# Patient Record
Sex: Male | Born: 1935 | Race: White | Hispanic: No | Marital: Married | Smoking: Former smoker
Health system: Southern US, Community
[De-identification: ages and names within clinical notes are randomized; demographics above are authoritative.]

## PROBLEM LIST (undated history)

## (undated) DIAGNOSIS — M1990 Unspecified osteoarthritis, unspecified site: Secondary | ICD-10-CM

## (undated) DIAGNOSIS — R251 Tremor, unspecified: Secondary | ICD-10-CM

## (undated) DIAGNOSIS — E785 Hyperlipidemia, unspecified: Secondary | ICD-10-CM

## (undated) DIAGNOSIS — I219 Acute myocardial infarction, unspecified: Secondary | ICD-10-CM

## (undated) DIAGNOSIS — I2510 Atherosclerotic heart disease of native coronary artery without angina pectoris: Secondary | ICD-10-CM

## (undated) DIAGNOSIS — R413 Other amnesia: Principal | ICD-10-CM

## (undated) HISTORY — DX: Hyperlipidemia, unspecified: E78.5

## (undated) HISTORY — PX: PARATHYROIDECTOMY: SHX19

## (undated) HISTORY — PX: CATARACT EXTRACTION: SUR2

## (undated) HISTORY — DX: Atherosclerotic heart disease of native coronary artery without angina pectoris: I25.10

## (undated) HISTORY — PX: BACK SURGERY: SHX140

## (undated) HISTORY — PX: CARPAL TUNNEL RELEASE: SHX101

## (undated) HISTORY — DX: Other amnesia: R41.3

## (undated) HISTORY — DX: Tremor, unspecified: R25.1

## (undated) HISTORY — PX: INGUINAL HERNIA REPAIR: SUR1180

---

## 1998-01-15 ENCOUNTER — Encounter
Admission: RE | Admit: 1998-01-15 | Discharge: 1998-04-15 | Payer: Self-pay | Admitting: Physical Medicine & Rehabilitation

## 1998-02-21 ENCOUNTER — Ambulatory Visit (HOSPITAL_COMMUNITY): Admission: RE | Admit: 1998-02-21 | Discharge: 1998-02-21 | Payer: Self-pay | Admitting: Neurology

## 1998-10-31 ENCOUNTER — Encounter: Payer: Self-pay | Admitting: Internal Medicine

## 1998-10-31 ENCOUNTER — Ambulatory Visit (HOSPITAL_COMMUNITY): Admission: RE | Admit: 1998-10-31 | Discharge: 1998-10-31 | Payer: Self-pay | Admitting: Internal Medicine

## 1999-06-17 ENCOUNTER — Inpatient Hospital Stay (HOSPITAL_COMMUNITY): Admission: EM | Admit: 1999-06-17 | Discharge: 1999-06-20 | Payer: Self-pay | Admitting: Emergency Medicine

## 1999-06-17 ENCOUNTER — Encounter: Payer: Self-pay | Admitting: Emergency Medicine

## 2000-10-21 ENCOUNTER — Emergency Department (HOSPITAL_COMMUNITY): Admission: EM | Admit: 2000-10-21 | Discharge: 2000-10-21 | Payer: Self-pay | Admitting: Emergency Medicine

## 2000-10-28 ENCOUNTER — Emergency Department (HOSPITAL_COMMUNITY): Admission: EM | Admit: 2000-10-28 | Discharge: 2000-10-28 | Payer: Self-pay | Admitting: Emergency Medicine

## 2002-10-21 ENCOUNTER — Emergency Department (HOSPITAL_COMMUNITY): Admission: EM | Admit: 2002-10-21 | Discharge: 2002-10-21 | Payer: Self-pay

## 2005-05-14 ENCOUNTER — Ambulatory Visit (HOSPITAL_COMMUNITY): Admission: RE | Admit: 2005-05-14 | Discharge: 2005-05-14 | Payer: Self-pay | Admitting: Internal Medicine

## 2005-07-17 ENCOUNTER — Ambulatory Visit (HOSPITAL_COMMUNITY): Admission: RE | Admit: 2005-07-17 | Discharge: 2005-07-18 | Payer: Self-pay | Admitting: Ophthalmology

## 2006-01-08 ENCOUNTER — Encounter: Admission: RE | Admit: 2006-01-08 | Discharge: 2006-01-08 | Payer: Self-pay | Admitting: Internal Medicine

## 2008-03-28 ENCOUNTER — Ambulatory Visit (HOSPITAL_COMMUNITY): Admission: RE | Admit: 2008-03-28 | Discharge: 2008-03-29 | Payer: Self-pay | Admitting: Ophthalmology

## 2009-09-20 ENCOUNTER — Encounter: Payer: Self-pay | Admitting: Cardiovascular Disease

## 2009-11-20 ENCOUNTER — Ambulatory Visit: Payer: Self-pay | Admitting: Internal Medicine

## 2010-03-28 ENCOUNTER — Ambulatory Visit: Payer: Self-pay | Admitting: Internal Medicine

## 2010-04-05 ENCOUNTER — Ambulatory Visit: Payer: Self-pay | Admitting: Internal Medicine

## 2010-04-08 ENCOUNTER — Ambulatory Visit (HOSPITAL_COMMUNITY): Admission: RE | Admit: 2010-04-08 | Discharge: 2010-04-08 | Payer: Self-pay | Admitting: Internal Medicine

## 2010-04-15 ENCOUNTER — Ambulatory Visit: Payer: Self-pay | Admitting: Internal Medicine

## 2010-06-20 ENCOUNTER — Ambulatory Visit (HOSPITAL_COMMUNITY): Admission: RE | Admit: 2010-06-20 | Discharge: 2010-06-20 | Payer: Self-pay | Admitting: Surgery

## 2010-09-26 ENCOUNTER — Ambulatory Visit
Admission: RE | Admit: 2010-09-26 | Discharge: 2010-09-26 | Payer: Self-pay | Source: Home / Self Care | Attending: Internal Medicine | Admitting: Internal Medicine

## 2010-09-26 ENCOUNTER — Encounter: Payer: Self-pay | Admitting: Cardiovascular Disease

## 2010-10-01 ENCOUNTER — Ambulatory Visit: Payer: Self-pay | Admitting: Cardiology

## 2010-11-01 ENCOUNTER — Encounter (HOSPITAL_BASED_OUTPATIENT_CLINIC_OR_DEPARTMENT_OTHER)
Admission: RE | Admit: 2010-11-01 | Discharge: 2010-11-01 | Disposition: A | Discharge status: Home / Self Care | Payer: MEDICARE | Source: Ambulatory Visit | Attending: Orthopedic Surgery | Admitting: Orthopedic Surgery

## 2010-11-01 DIAGNOSIS — Z01812 Encounter for preprocedural laboratory examination: Secondary | ICD-10-CM | POA: Insufficient documentation

## 2010-11-01 LAB — BASIC METABOLIC PANEL
BUN: 10 mg/dL (ref 6–23)
CO2: 27 mEq/L (ref 19–32)
Chloride: 104 mEq/L (ref 96–112)
Creatinine, Ser: 0.86 mg/dL (ref 0.4–1.5)
GFR calc Af Amer: >60 mL/min (ref >60)
Glucose, Bld: 128 mg/dL — ABNORMAL HIGH (ref 70–99)

## 2010-11-05 ENCOUNTER — Ambulatory Visit (HOSPITAL_BASED_OUTPATIENT_CLINIC_OR_DEPARTMENT_OTHER)
Admission: RE | Admit: 2010-11-05 | Discharge: 2010-11-05 | Disposition: A | Discharge status: Home / Self Care | Payer: MEDICARE | Source: Ambulatory Visit | Attending: Orthopedic Surgery | Admitting: Orthopedic Surgery

## 2010-11-05 DIAGNOSIS — E1142 Type 2 diabetes mellitus with diabetic polyneuropathy: Secondary | ICD-10-CM | POA: Insufficient documentation

## 2010-11-05 DIAGNOSIS — Z01812 Encounter for preprocedural laboratory examination: Secondary | ICD-10-CM | POA: Insufficient documentation

## 2010-11-05 DIAGNOSIS — E1149 Type 2 diabetes mellitus with other diabetic neurological complication: Secondary | ICD-10-CM | POA: Insufficient documentation

## 2010-11-05 DIAGNOSIS — G5600 Carpal tunnel syndrome, unspecified upper limb: Secondary | ICD-10-CM | POA: Insufficient documentation

## 2010-11-05 DIAGNOSIS — G5620 Lesion of ulnar nerve, unspecified upper limb: Secondary | ICD-10-CM | POA: Insufficient documentation

## 2010-11-05 LAB — GLUCOSE, CAPILLARY: Glucose-Capillary: 94 mg/dL (ref 70–99)

## 2010-11-12 NOTE — Op Note (Signed)
NAME:  Joseph Mora, Joseph Mora               ACCOUNT NO.:  [id number]  MEDICAL RECORD NO.:  [id number]           PATIENT TYPE:  LOCATION:                                 FACILITY:  PHYSICIAN:  Katy Fitch. Toma Erichsen, M.D. DATE OF BIRTH:  [birth year]  DATE OF PROCEDURE:  11/05/2010 DATE OF DISCHARGE:                              OPERATIVE REPORT   PREOPERATIVE DIAGNOSES:  Severe right carpal tunnel syndrome with background type 2 diabetes and neuropathy, also significant right ulnar neuropathy at cubital tunnel with marked slowing of conduction velocities on preoperative electrodiagnostic studies.  POSTOPERATIVE DIAGNOSES:  Severe right carpal tunnel syndrome with background type 2 diabetes and neuropathy, also significant right ulnar neuropathy at cubital tunnel with marked slowing of conduction velocities on preoperative electrodiagnostic studies.  OPERATION: 1. Release of right transcarpal ligament. 2. In situ decompression of right ulnar nerve at cubital tunnel.  OPERATING SURGEON:  Katy Fitch. Akira Perusse, MD.  ASSISTANT:  Marveen Reeks. Dasnoit, PA-C.  ANESTHESIA:  General by LMA.  SUPERVISING ANESTHESIOLOGIST:  Janetta Hora. Gelene Mink, MD.  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 Luanna Cole. Mora for evaluation and management of hand pain and numbness.  Clinical examination and review of his past records revealed multiple complex medical problems including coronary artery disease, diabetes, hypertension, history of myocardial infarction with stent placement. His cardiologist is Dr. Roger Shelter.  He recently had parathyroidectomy by Dr. Gerrit Friends.  Clinical examination suggested mild neuropathy with superimposed very significant carpal tunnel syndrome and probable ulnar neuropathy based on irritability of the nerves and direct compression testing.  Dr. Johna Roles completed detailed electrodiagnostic studies, which  revealed very significant slowing in the median conduction across the carpal tunnels bilaterally and across the ulnar nerves at the elbows bilaterally.  After detailed informed consent during which we advised Joseph Mora that we could not guarantee full recovery of his sensibility, he is brought to the operating room at this time, anticipating a salvage procedure to improve his sensibility and motor function as much as possible and to try to decrease his chronic pain predicament.  After informed consent, he is brought to the operating room at this time.  PROCEDURE:  Joseph Mora was interviewed by Dr. Gelene Mink in the holding area; and after review of his complex medical records, general anesthesia by LMA technique was recommended and accepted.  He was brought to room #8 at [facility], placed in supine position on the operating table.  Following induction of general anesthesia by LMA technique under Dr. Thornton Dales direct supervision, the right arm was prepped with Betadine soap solution, sterilely draped.  Pneumatic tourniquet was applied to the proximal right brachium.  Following exsanguination of the right arm with Esmarch bandage, arterial tourniquet was inflated to 220 mmHg.  Procedure commenced with a short incision in line of the ring finger and the palm.  Subcutaneous tissues were carefully divided revealing the palmar fascia.  This was split longitudinally to reveal the common sensory branch of the median nerve. These were followed back to the transcarpal ligament which was gently isolated from the  median nerve with the aid of Catering manager. The transverse carpal ligament was very thick, measuring more than 5 mm in thickness.  The median nerve was very significantly compressed at the distal margin of transverse carpal ligament.  A meticulous release of the palmar fascia, transverse carpal ligament, and volar forearm fascia was accomplished with scissors into  the distal forearm.  This widely opened the carpal canal.  No mass or other predicaments were noted. Bleeding points along the margin of the released ligament were electrocauterized with bipolar current followed by repair of the skin with intradermal through a Prolene suture.  Attention was then directed to the elbow.  The posterior aspect of the medial epicondyle was identified.  A 3-cm incision was fashioned posterior to the epicondyle.  Subcutaneous tissue were carefully divided taking care to identify and gently protect the posterior branch of medial antebrachial cutaneous nerve.  The ulnar nerve was decompressed by release of the arcuate ligament, Osborne's band, the fascia at the head of flexor carpi ulnaris, and numerous vascular structures directly overlying the nerve in the distal cubital tunnel.  No mass or other predicaments were noted.  The brachial fascia was released proximally 6 cm above the cubital tunnel and the antebrachial fascia released 6 cm distally.  The nerve was noted be stable through range of motion 0 to 130 degrees. There was a triceps that covered the nerve, however, this appeared to be protective rather than causing nerve forward out of the groove.  The wound was inspected for bleeding points followed by release of the tourniquet.  Hemostasis was achieved with saline irrigation and bipolar cautery.  The wound was then repaired with layered closure with 4-0 Vicryl subcutaneous suture and intradermal 3-0 Prolene with Steri-Strips.  For aftercare, Joseph Mora is provided a prescription for Percocet 5 mg 1 p.o. q.4-6 h. p.r.n. pain 20 tablets without refill.  He is also provided doxycycline 100 mg 1 p.o. q.12 h. x4 days as prophylactic antibiotic.  He will resume all his routine medications prescribed by Dr. Deborah Chalk and Dr. Lenord Fellers.  It should be noted that sequential compression devices were applied to his calves and activated prior to induction of general  anesthesia.  We had a routine surgical time-out and noted his allergies to PENICILLIN, MORPHINE, and intolerance to ASPIRIN.     Katy Fitch Catalino Plascencia, M.D.     RVS/MEDQ  D:  11/05/2010  T:  11/06/2010  Job:  [id number]  cc:   Joseph Cole. Lenord Fellers, M.D. Colleen Can. Deborah Chalk, M.D.  Electronically Signed by Josephine Igo M.D. on 11/12/2010 08:14:08 AM

## 2010-12-05 ENCOUNTER — Encounter (HOSPITAL_BASED_OUTPATIENT_CLINIC_OR_DEPARTMENT_OTHER)
Admission: RE | Admit: 2010-12-05 | Discharge: 2010-12-05 | Disposition: A | Discharge status: Home / Self Care | Payer: Medicare Other | Source: Ambulatory Visit | Attending: Orthopedic Surgery | Admitting: Orthopedic Surgery

## 2010-12-05 DIAGNOSIS — Z01812 Encounter for preprocedural laboratory examination: Secondary | ICD-10-CM | POA: Insufficient documentation

## 2010-12-05 LAB — URINALYSIS, ROUTINE W REFLEX MICROSCOPIC
Bilirubin Urine: NEGATIVE
Glucose, UA: NEGATIVE mg/dL
Hgb urine dipstick: NEGATIVE
Ketones, ur: NEGATIVE mg/dL
Nitrite: NEGATIVE
Protein, ur: NEGATIVE mg/dL
Urobilinogen, UA: 0.2 mg/dL (ref 0.0–1.0)

## 2010-12-05 LAB — GLUCOSE, CAPILLARY
Glucose-Capillary: 164 mg/dL — ABNORMAL HIGH (ref 70–99)
Glucose-Capillary: 168 mg/dL — ABNORMAL HIGH (ref 70–99)

## 2010-12-05 LAB — SURGICAL PCR SCREEN: Staphylococcus aureus: NEGATIVE

## 2010-12-05 LAB — BASIC METABOLIC PANEL
BUN: 10 mg/dL (ref 6–23)
BUN: 14 mg/dL (ref 6–23)
CO2: 27 mEq/L (ref 19–32)
CO2: 27 mEq/L (ref 19–32)
Creatinine, Ser: 1.06 mg/dL (ref 0.4–1.5)
GFR calc Af Amer: >60 mL/min (ref >60)
GFR calc non Af Amer: >60 mL/min (ref >60)
Potassium: 4.4 mEq/L (ref 3.5–5.1)
Sodium: 139 mEq/L (ref 135–145)

## 2010-12-05 LAB — CBC
HCT: 47.4 % (ref 39.0–52.0)
MCHC: 34.6 g/dL (ref 30.0–36.0)
MCV: 96.9 fL (ref 78.0–100.0)
Platelets: 126 10*3/uL — ABNORMAL LOW (ref 150–400)
RBC: 4.89 MIL/uL (ref 4.22–5.81)
RDW: 13.6 % (ref 11.5–15.5)

## 2010-12-05 LAB — PROTIME-INR: INR: 1.05 (ref 0.00–1.49)

## 2010-12-05 LAB — DIFFERENTIAL
Basophils Absolute: 0 10*3/uL (ref 0.0–0.1)
Lymphocytes Relative: 21 % (ref 12–46)

## 2010-12-10 ENCOUNTER — Ambulatory Visit (HOSPITAL_BASED_OUTPATIENT_CLINIC_OR_DEPARTMENT_OTHER)
Admission: RE | Admit: 2010-12-10 | Discharge: 2010-12-10 | Disposition: A | Discharge status: Home / Self Care | Payer: Medicare Other | Source: Ambulatory Visit | Attending: Orthopedic Surgery | Admitting: Orthopedic Surgery

## 2010-12-10 DIAGNOSIS — F17200 Nicotine dependence, unspecified, uncomplicated: Secondary | ICD-10-CM | POA: Insufficient documentation

## 2010-12-10 DIAGNOSIS — I252 Old myocardial infarction: Secondary | ICD-10-CM | POA: Insufficient documentation

## 2010-12-10 DIAGNOSIS — Z01812 Encounter for preprocedural laboratory examination: Secondary | ICD-10-CM | POA: Insufficient documentation

## 2010-12-10 DIAGNOSIS — G5600 Carpal tunnel syndrome, unspecified upper limb: Secondary | ICD-10-CM | POA: Insufficient documentation

## 2010-12-10 DIAGNOSIS — E119 Type 2 diabetes mellitus without complications: Secondary | ICD-10-CM | POA: Insufficient documentation

## 2010-12-10 DIAGNOSIS — G5620 Lesion of ulnar nerve, unspecified upper limb: Secondary | ICD-10-CM | POA: Insufficient documentation

## 2010-12-10 LAB — GLUCOSE, CAPILLARY: Glucose-Capillary: 113 mg/dL — ABNORMAL HIGH (ref 70–99)

## 2010-12-10 LAB — POCT HEMOGLOBIN-HEMACUE: Hemoglobin: 16.5 g/dL (ref 13.0–17.0)

## 2010-12-17 NOTE — Op Note (Signed)
NAME:  Joseph Mora, Joseph Mora               ACCOUNT NO.:  [id number]  MEDICAL RECORD NO.:  [id number]          PATIENT TYPE:  AMB  LOCATION:  DAY                          FACILITY:  [facility]  PHYSICIAN:  Katy Fitch. Shawndrea Rutkowski, M.D. DATE OF BIRTH:  10-Dec-1932  DATE OF PROCEDURE:  12/10/2010 DATE OF DISCHARGE:  06/20/2010                              OPERATIVE REPORT   PREOPERATIVE DIAGNOSES: 1. Chronic left ulnar neuropathy at cubital tunnel, associated with     diabetes. 2. Chronic left carpal tunnel syndrome, associated with diabetes.  POSTOPERATIVE DIAGNOSES: 1. Chronic left ulnar neuropathy at cubital tunnel, associated with     diabetes. 2. Chronic left carpal tunnel syndrome, associated with diabetes.  OPERATION: 1. In situ decompression, left ulnar nerve at cubital tunnel with     resection of triceps aponeurosis. 2. Left carpal tunnel release.  OPERATING SURGEON:  Katy Fitch. Jerry Clyne, M.D.  ASSISTANT:  Jonni Sanger, P.A.  ANESTHESIA:  General by LMA.  SUPERVISING ANESTHESIOLOGIST:  Dr. Jean Rosenthal.  INDICATIONS:  Joseph Mora is a 77 year old gentleman referred through the courtesy of Dr. Eden Emms Baxley and Dr. Delfin Edis for evaluation and management of chronic entrapment neuropathy of the median and ulnar nerves bilaterally.  Joseph Mora was thoroughly worked up by Dr. Lenord Fellers and referred to our office for evaluation of hand numbness.  Clinical examination revealed signs of significant entrapment neuropathy associated with diabetes.  He is status post release of his right transverse carpal ligament and right ulnar nerve at the cubital tunnel in February with good recovery of sensibility.  He now returns for identical surgery on the left side.  Preoperatively, we reviewed his past medical history.  He has a BMI of 35.  He has diabetes.  His blood glucose was 169 preoperatively.  He is allergic to PENICILLIN, MORPHINE, and ASPIRIN.  Preoperatively, he was interviewed in the  holding area.  Questions were invited and answered in detail with Joseph Mora and his wife.  PROCEDURE:  Joseph Mora was brought to room #1 of the [facility] and placed in supine position on the operating table.  Following the induction of general anesthesia by LMA technique, the left arm was prepped with Betadine soap solution and sterilely draped.  Due to his PENICILLIN allergy, 1 g of vancomycin was administered as an IV prophylactic antibiotic.  The left arm was exsanguinated with Esmarch bandage, and arterial tourniquet was inflated to 250 mmHg due to episodic elevated blood pressure.  After routine surgical time-out, the procedure commenced with a short incision in line of the ring finger and the palm.  Subcutaneous tissues were carefully divided revealing the palmar fascia.  This was split longitudinally to reveal the common sensory branch of the median nerve.  These were followed back to the transverse carpal ligament which was gently isolated from the median nerve.  Ligament was then released along its ulnar border, extending into the distal forearm.  This widely opened the carpal canal.  Bleeding points along the margin of the released ligament were electrocauterized with bipolar current followed by repair of the skin with intradermal 2-0 Prolene suture.  Attention  was then directed to the elbow.  The ulnar nerve was palpated behind the posterior aspect of the medial epicondyle.  A 3-cm incision was fashioned directly over the nerve.  Subcutaneous tissues were carefully divided, taking care to identify and spare the posterior branch of the median, ulnar, and brachial cutaneous nerve.  The arcuate ligament was identified and split with scissors.  The nerve was decompressed 6 cm above the epicondyle and 6 cm distal to the epicondyle.  The fascia at the head of the flexor carpi ulnaris was split.  Vascular structures over the nerve were released.  A  large- caliber vein encircling the nerve was suture ligated with 4-0 Vicryl.  The nerve was noted to be stable with elbow range of motion 0 to 130 degrees.  The triceps fascia did tend to compress the nerve with flexion of the elbow beyond 90 degrees, therefore with the aid of bipolar cautery we resected a strip of the aponeurosis, measuring 1 cm in width and approximately 3 cm in length, widely decompressing the nerve at the cubital tunnel.  Thereafter, only triceps muscle was adjacent to the nerve and no subluxation noted.  Bleeding points were electrocauterized with bipolar current followed by repair of the skin and subcutaneous with 4-0 Vicryl suture and intradermal 2-0 Prolene with Steri-Strips; 2% lidocaine was infiltrated for postop analgesia.  Joseph Mora was placed in a compressive dressing at the elbow with sterile gauze and Tegaderm over the wound and at the wrist.  Steri-Strips, sterile gauze, sterile Webril, and a volar plaster splint maintaining the wrist in 10 degrees of dorsiflexion.  For aftercare, he is provided prescription for Vicodin 5 mg one p.o. q.4- 6 h. p.r.n. pain, 20 tablets without refill.  I will see him back for followup in the office in 8 days, for dressing change and initiation of range of motion exercise program.     Katy Fitch. Macsen Nuttall, M.D.     RVS/MEDQ  D:  12/10/2010  T:  12/11/2010  Job:  [id number]  cc:   Luanna Cole. Lenord Fellers, M.D. Colleen Can. Deborah Chalk, M.D.  Electronically Signed by Josephine Igo M.D. on 12/17/2010 01:11:51 PM

## 2011-02-04 NOTE — Op Note (Signed)
NAME:  Joseph Mora, MCMANAMON NO.:  [id number]   MEDICAL RECORD NO.:  [id number]          PATIENT TYPE:  OIB   LOCATION:  5128                         FACILITY:  [facility]   PHYSICIAN:  Beulah Gandy. Ashley Royalty, M.D. DATE OF BIRTH:  07-23-1933   DATE OF PROCEDURE:  03/28/2008  DATE OF DISCHARGE:                               OPERATIVE REPORT   ADMISSION DIAGNOSES:  1. Proliferative diabetic retinopathy.  2. Traction retinal detachment.  3. Posterior vitreous membranes vitreous hemorrhage, left eye.   PROCEDURES:  1. Pars plana vitrectomy with membrane peel.  2. Retinal photocoagulation.  3. Gas injection, left eye.   SURGEON:  Beulah Gandy. Ashley Royalty, MD   ASSISTANT:  Rosalie Doctor, MA   ANESTHESIA:  General.   DETAILS:  Usual prep and drape, a 25-gauge trocar was placed at 10, 2,  and 4 o'clock.  Infusion at 4 o'clock.  The lighted pick and the cutter  were placed at 10 and 2 o'clock respectively.  Provisc placed on the  corneal surface.  Pars plana vitrectomy was begun just behind the  pseudophakos.  Blood and vitreous was encountered and carefully removed  from the posterior aspect of the pseudophakos.  The vitrectomy is  carried posteriorly and vitreous membranes were seen at the disk and  along with superior arcade.  These were carefully removed under low  suction and rapid cutting and with the use of the vitreous lighted pick.  These membranes were trimmed down to the retinal surface.  The  vitrectomy was carried out into the far periphery where additional areas  of blood and vitreous were encountered.  These were trimmed for 360  degrees down to the vitreous base.  Scleral depression was used.  Once  this was accomplished, the endolaser was positioned in the eye, 495  burns placed around the retinal periphery.  The power 1000 milliwatts,  1000 microns each and 0.1 seconds each.  A 30% gas fluid exchange was  carried out with sterile room air.  The instruments were removed  from  the eye and the wounds were held until tight.  Polymyxin and gentamicin  were irrigated into Tenon's space.  Marcaine was injected around the  globe for postop pain.  Decadron 10 mg was injected at the lower  subconjunctival space.  Closing pressure was 10 with a Barraquer  tonometer.  TobraDex ophthalmic ointment, patch, and shield were placed.  The patient was awakened and taken to recovery in satisfactory  condition.  Complications none.  Duration 30 minutes.      Beulah Gandy. Ashley Royalty, M.D.  Electronically Signed    JDM/MEDQ  D:  03/28/2008  T:  03/29/2008  Job:  [id number]

## 2011-02-07 NOTE — Op Note (Signed)
NAME:  Joseph, Mora NO.:  [id number]   MEDICAL RECORD NO.:  [id number]          PATIENT TYPE:  OIB   LOCATION:  5705                         FACILITY:  [facility]   PHYSICIAN:  Beulah Gandy. Ashley Royalty, M.D. DATE OF BIRTH:  [birth year]   DATE OF PROCEDURE:  DATE OF DISCHARGE:                                 OPERATIVE REPORT   ADMISSION DIAGNOSIS:  Vitreous hemorrhage, retained lens material,  proliferative diabetic retinopathy, right eye.   PROCEDURE:  1.  Pars plana vitrectomy.  2.  Retinal photocoagulation.  3.  Gas fluid exchange, right eye.   SURGEON:  Beulah Gandy. Ashley Royalty, M.D.   ASSISTANT:  Adela Ports , RN   ANESTHESIA:  General.   DETAILS:  Usual prep and drape, trocar placement at 8, 10, and 2 o'clock.  Infusion port at 8 o'clock.  The lighted pick and the cutter were placed at  10 and 2 o'clock respectively. Provisc placed on the corneal surface. The  BIOM viewing system moved into place. Pars plana vitrectomy was begun just  behind the pseudophakos. White lens material was removed from the edge of  the pseudophakos. The vitrectomy was carried posteriorly where vitreous  blood was seen. This was carefully removed under low suction and rapid  cutting for 360 degrees. Scleral depression was used at 6 o'clock to gain  access to the peripheral vitreous space; and then removed red blood from  this area. Lens remnants and vitreous blood were removed.   Once all of the vitreous was removed.  The endolaser was positioned in the  eye, 813 burns were placed around the retinal periphery at the power 1000  milliwatts, 1000 microns each, and 0.1 seconds each. A washout procedure was  performed; 30% gas fluid exchange was performed.  The instruments and  trocars were removed from the eye. Polymyxin and gentamicin were irrigated  into Tenons space. Atropine solution was applied  Decadron 10 mg was  injected to the lower subconjunctival space. Marcaine was injected around  the  globe for postop pain. Closing pressure was 10 with a Risk manager.  Complications none. Duration 45 minutes. TobraDex ophthalmic ointment, and a  patch and shield were placed. The patient was awakened and taken to recovery  in satisfactory condition.      Beulah Gandy. Ashley Royalty, M.D.  Electronically Signed     JDM/MEDQ  D:  07/17/2005  T:  07/17/2005  Job:  [id number]

## 2011-02-15 ENCOUNTER — Other Ambulatory Visit: Payer: Self-pay | Admitting: Internal Medicine

## 2011-02-28 ENCOUNTER — Other Ambulatory Visit: Payer: Self-pay | Admitting: Internal Medicine

## 2011-03-28 ENCOUNTER — Other Ambulatory Visit: Payer: Self-pay | Admitting: Internal Medicine

## 2011-04-03 ENCOUNTER — Telehealth: Payer: Self-pay | Admitting: Cardiology

## 2011-04-03 ENCOUNTER — Ambulatory Visit (INDEPENDENT_AMBULATORY_CARE_PROVIDER_SITE_OTHER): Payer: Medicare Other | Admitting: Cardiovascular Disease

## 2011-04-03 ENCOUNTER — Encounter: Payer: Self-pay | Admitting: Cardiovascular Disease

## 2011-04-03 VITALS — BP 111/73 | HR 70 | Ht 64.0 in | Wt 205.0 lb

## 2011-04-03 DIAGNOSIS — I2510 Atherosclerotic heart disease of native coronary artery without angina pectoris: Secondary | ICD-10-CM | POA: Insufficient documentation

## 2011-04-03 DIAGNOSIS — E785 Hyperlipidemia, unspecified: Secondary | ICD-10-CM | POA: Insufficient documentation

## 2011-04-03 NOTE — Telephone Encounter (Deleted)
[phone] LABS

## 2011-04-03 NOTE — Assessment & Plan Note (Addendum)
Stable. Continue current meds.   

## 2011-04-03 NOTE — Assessment & Plan Note (Signed)
Continue statin. Will get recent Lipids from [place] office.

## 2011-04-03 NOTE — Patient Instructions (Signed)
Your physician wants you to follow-up in: 6 months with Dr. McAlhany.  You will receive a reminder letter in the mail two months in advance. If you don't receive a letter, please call our office to schedule the follow-up appointment.  

## 2011-04-03 NOTE — Progress Notes (Signed)
History of Present Illness:77 yo WM with history of CAD, DM, HL, DJD here today for cardiac follow up. He has been followed in the past by Dr. Deborah Chalk. His cardiac history dates back to 41 when he had a stent placed in the Circumflex. He had a stent placed in the RCA in 2000. LVEF has been normal. Most recent stress test in July 2011 with LVEF of 68% and no ischemia. He tells me that he has chronic hip pain which limits his walking. No chest pain. No trouble breathing. He works in the yard and builds swings. Overall doing well.   Past Medical History  Diagnosis Date  . CAD (coronary artery disease)   . Hyperlipidemia   . Diabetes mellitus     Past Surgical History  Procedure Date  . Carpal tunnel release   . Parathyroidectomy   . Eye surgery   . Inguinal hernia repair     Current Outpatient Prescriptions  Medication Sig Dispense Refill  . aspirin 325 MG tablet Take 325 mg by mouth daily.        Marland Kitchen atorvastatin (LIPITOR) 40 MG tablet        . doxazosin (CARDURA) 2 MG tablet TAKE 1 TABLET BY MOUTH DAILY  30 tablet  5  . HUMULIN N 100 UNIT/ML injection INJECT 60 UNITS BEFORE BREAKFAST AND 30 UNITS BEFORE SUPPER  90 mL  PRN  . lisinopril (PRINIVIL,ZESTRIL) 20 MG tablet TAKE 1 TABLET BY MOUTH DAILY  30 tablet  5  . metoprolol (LOPRESSOR) 50 MG tablet TAKE 1 TABLET BY MOUTH TWICE DAILY  60 tablet  5    Allergies  Allergen Reactions  . Aspirin     Hives H/O  . Morphine And Related   . Penicillins     History   Social History  . Marital Status: Married    Spouse Name: N/A    Number of Children: N/A  . Years of Education: N/A   Occupational History  . Not on file.   Social History Main Topics  . Smoking status: Former Smoker    Quit date: 08/03/2010  . Smokeless tobacco: Current User    Types: Chew  . Alcohol Use: No  . Drug Use: No  . Sexually Active: Not on file   Other Topics Concern  . Not on file   Social History Narrative  . No narrative on file    Family  History  Problem Relation Age of Onset  . Heart attack Paternal Aunt     Review of Systems:  As stated in the HPI and otherwise negative.   BP 111/73  Pulse 70  Ht 5\' 4"  (1.626 m)  Wt 205 lb (92.987 kg)  BMI 35.19 kg/m2  Physical Examination: General: Well developed, well nourished, NAD HEENT: OP clear, mucus membranes moist SKIN: warm, dry. No rashes. Neuro: No focal deficits Musculoskeletal: Muscle strength 5/5 all ext Psychiatric: Mood and affect normal Neck: No JVD, no carotid bruits, no thyromegaly, no lymphadenopathy. Lungs:Clear bilaterally, no wheezes, rhonci, crackles Cardiovascular: Regular rate and rhythm. No murmurs, gallops or rubs. Abdomen:Soft. Bowel sounds present. Non-tender.  Extremities: No lower extremity edema. Pulses are trace to 1+ in the bilateral DP/PT.  EKG:NSR, 70 bpm.

## 2011-04-16 ENCOUNTER — Encounter: Payer: Self-pay | Admitting: Internal Medicine

## 2011-04-21 ENCOUNTER — Ambulatory Visit (INDEPENDENT_AMBULATORY_CARE_PROVIDER_SITE_OTHER): Payer: Medicare Other | Admitting: Internal Medicine

## 2011-04-21 ENCOUNTER — Encounter: Payer: Self-pay | Admitting: Internal Medicine

## 2011-04-21 DIAGNOSIS — E119 Type 2 diabetes mellitus without complications: Secondary | ICD-10-CM

## 2011-04-21 DIAGNOSIS — G8929 Other chronic pain: Secondary | ICD-10-CM | POA: Insufficient documentation

## 2011-04-21 DIAGNOSIS — E785 Hyperlipidemia, unspecified: Secondary | ICD-10-CM

## 2011-04-21 DIAGNOSIS — Z8619 Personal history of other infectious and parasitic diseases: Secondary | ICD-10-CM | POA: Insufficient documentation

## 2011-04-21 DIAGNOSIS — E213 Hyperparathyroidism, unspecified: Secondary | ICD-10-CM | POA: Insufficient documentation

## 2011-04-21 DIAGNOSIS — M549 Dorsalgia, unspecified: Secondary | ICD-10-CM

## 2011-04-21 DIAGNOSIS — Z86010 Personal history of colonic polyps: Secondary | ICD-10-CM

## 2011-04-21 DIAGNOSIS — N400 Enlarged prostate without lower urinary tract symptoms: Secondary | ICD-10-CM | POA: Insufficient documentation

## 2011-04-21 DIAGNOSIS — IMO0001 Reserved for inherently not codable concepts without codable children: Secondary | ICD-10-CM | POA: Insufficient documentation

## 2011-04-21 DIAGNOSIS — I10 Essential (primary) hypertension: Secondary | ICD-10-CM

## 2011-04-21 LAB — BASIC METABOLIC PANEL
CO2: 26 mEq/L (ref 19–32)
Calcium: 9.1 mg/dL (ref 8.4–10.5)
Chloride: 104 mEq/L (ref 96–112)
Creat: 1.01 mg/dL (ref 0.50–1.35)
Glucose, Bld: 117 mg/dL — ABNORMAL HIGH (ref 70–99)

## 2011-04-21 LAB — HEPATIC FUNCTION PANEL
Albumin: 3.9 g/dL (ref 3.5–5.2)
Alkaline Phosphatase: 65 U/L (ref 39–117)
Bilirubin, Direct: 0.1 mg/dL (ref 0.0–0.3)
Total Bilirubin: 0.6 mg/dL (ref 0.3–1.2)

## 2011-04-21 LAB — CBC WITH DIFFERENTIAL/PLATELET
Eosinophils Absolute: 0.2 10*3/uL (ref 0.0–0.7)
Eosinophils Relative: 3 % (ref 0–5)
HCT: 49.1 % (ref 39.0–52.0)
Hemoglobin: 15.9 g/dL (ref 13.0–17.0)
Lymphocytes Relative: 23 % (ref 12–46)
Lymphs Abs: 1.6 10*3/uL (ref 0.7–4.0)
MCH: 31.5 pg (ref 26.0–34.0)
MCV: 97.2 fL (ref 78.0–100.0)
Monocytes Absolute: 0.5 10*3/uL (ref 0.1–1.0)
Monocytes Relative: 7 % (ref 3–12)
Platelets: 117 10*3/uL — ABNORMAL LOW (ref 150–400)
RBC: 5.05 MIL/uL (ref 4.22–5.81)
WBC: 6.8 10*3/uL (ref 4.0–10.5)

## 2011-04-21 LAB — POCT URINALYSIS DIPSTICK
Leukocytes, UA: NEGATIVE
Nitrite, UA: NEGATIVE
Protein, UA: NEGATIVE
pH, UA: 6

## 2011-04-21 LAB — PSA: PSA: 1.35 ng/mL (ref <=4.00)

## 2011-04-21 LAB — LIPID PANEL
HDL: 35 mg/dL — ABNORMAL LOW (ref >39)
LDL Cholesterol: 89 mg/dL (ref 0–99)
Total CHOL/HDL Ratio: 4.1 Ratio

## 2011-04-21 NOTE — Progress Notes (Signed)
Subjective:    Patient ID: Michel Santee, male    DOB: 12/16/1932, 77 y.o.   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 Coronary artery disease status post MI with PTCA for 100% occluded circumflex artery September 1990. CMV infection 1997. Diagnosed with Helicobacter  pylori gastritis March 1999. History of right inguinal hernia repair 1953. Lumbar disc surgery L4-L5 and L5-S1 1998. Cataract removal right eye 2006. Also had cardiac catheterization and stent placed to right coronary artery September 2000. Hand surgery for chronic left ulnar neuropathy due to diabetes and a left carpal tunnel release done by Dr. Teressa Senter for March 2012. Cardiologist did nuclear perfusion study July 2011 which was normal. Had severe right carpal tunnel syndrome release and insight to decompression the right ulnar nerve by Dr. Teressa Senter February 2012. Surgery for left superior parathyroid adenoma September 2011. Had colonoscopy done by Dr. Loreta Ave with diagnosis of adenomatous polyp October 2010. Had Pneumovax vaccine in 1997. He thought he had a reaction to the vaccine but actually had an acute bout of CMV. However it has not been repeated since then. In 1999 he was hospitalized for back surgery and subsequently developed a GI bleed requiring emergency surgery with gastroduodenostomy, pyloroplasty, and truncal vagotomy. A large bleeding duodenal ulcer was detected. H. pylori clo test was positive and was treated at that time. He quit smoking in 1990. Chews tobacco Does not consume alcohol. He is married, has adult children and has raised a number of foster children over the years.  Family history father died age 55 struck by a motor vehicle; mother died at age 12 with history of diabetes mellitus, stroke and  Alzheimer's disease. One brother died at age 73 with history of colon cancer alcoholism and MI. 7 sisters and 7 brothers living. One living brother with adult onset diabetes. 3 sisters with diabetes mellitus.    Review of Systems  Constitutional:       Says he's able to walk 1 mile daily. Use to walk more than that but back hurts a lot  HENT: Negative.   Eyes: Negative.   Respiratory: Negative.   Cardiovascular: Negative.   Gastrointestinal: Negative.   Genitourinary:       Some hesitancy with urine stream  Musculoskeletal: Positive for back pain.  Neurological: Negative.   Hematological: Negative.   Psychiatric/Behavioral: Negative.        Objective:   Physical Exam  Vitals reviewed. Constitutional: He is oriented to person, place, and time. He appears well-developed and well-nourished. No distress.  HENT:  Head: Normocephalic and atraumatic.       Cerumen both ears  Eyes: Conjunctivae and EOM are normal. Pupils are equal, round, and reactive to light. Right eye exhibits no discharge. Left eye exhibits no discharge. No scleral icterus.  Neck: Neck supple. No JVD present. No thyromegaly present.  Cardiovascular: Normal rate, regular rhythm, normal heart sounds and intact distal pulses.   No murmur heard. Pulmonary/Chest: Effort normal and breath sounds normal. No respiratory distress. He has no wheezes. He has no rales. He exhibits no tenderness.  Abdominal: Soft. Bowel sounds are normal. He exhibits no distension and no mass. There is no tenderness. There is no rebound and no guarding.       Ventral hernia  Genitourinary:       Enlarged  prostate no masses  Musculoskeletal: Normal range of motion. He exhibits no edema.  Lymphadenopathy:    He has no cervical adenopathy.  Neurological: He is alert and oriented to person, place, and time. He has normal reflexes. No cranial nerve deficit. Coordination normal.  Skin: No rash noted. He is not diaphoretic.       Onychomycosis both  feet  Psychiatric: He has a normal mood and affect. His behavior is normal. Judgment and thought content normal.          Assessment & Plan:   Adult onset diabetes mellitus-insulin-dependent  Coronary artery disease status post PTCA circumflex artery 1990 and stent to right coronary artery September 2000.  Hyperlipidemia  History of adenomatous colon polyps  Benign prostatic hypertrophy  History of parathyroid adenoma removed 2011  History of spinal stenosis and lumbar disc disease with chronic back pain  History of acute bleeding duodenal ulcer after back surgery 1999 with positive test for H. Pylori  History of bilateral carpal tunnel syndrome status post release and ulnar decompression bilaterally  Patient will return in 6 months for office visit, hemoglobin A1c, lipid panel liver functions. He is to receive influenza immunization this fall.

## 2011-04-21 NOTE — Telephone Encounter (Signed)
No note required for this encounter.

## 2011-04-22 ENCOUNTER — Encounter: Payer: Self-pay | Admitting: Internal Medicine

## 2011-04-22 LAB — HEMOGLOBIN A1C: Hgb A1c MFr Bld: 9.5 % — ABNORMAL HIGH (ref <5.7)

## 2011-04-28 ENCOUNTER — Ambulatory Visit (INDEPENDENT_AMBULATORY_CARE_PROVIDER_SITE_OTHER): Payer: Medicare Other | Admitting: Ophthalmology

## 2011-04-28 DIAGNOSIS — E11359 Type 2 diabetes mellitus with proliferative diabetic retinopathy without macular edema: Secondary | ICD-10-CM

## 2011-04-28 DIAGNOSIS — H43819 Vitreous degeneration, unspecified eye: Secondary | ICD-10-CM

## 2011-04-29 ENCOUNTER — Telehealth: Payer: Self-pay | Admitting: Internal Medicine

## 2011-05-01 NOTE — Telephone Encounter (Signed)
Dr. Lenord Fellers made aware of results of blood sugars

## 2011-05-15 ENCOUNTER — Other Ambulatory Visit: Payer: Self-pay | Admitting: Internal Medicine

## 2011-05-30 ENCOUNTER — Emergency Department (HOSPITAL_COMMUNITY): Admission: EM | Admit: 2011-05-30 | Payer: Medicare Other | Source: Home / Self Care

## 2011-06-19 LAB — COMPREHENSIVE METABOLIC PANEL
Albumin: 3.4 — ABNORMAL LOW
Alkaline Phosphatase: 106
BUN: 10
CO2: 28
Chloride: 102
Creatinine, Ser: 0.93
GFR calc non Af Amer: >60
Potassium: 4.3
Total Bilirubin: 0.9

## 2011-06-19 LAB — CBC
HCT: 49.2
Hemoglobin: 16.7
MCV: 95.4
Platelets: 132 — ABNORMAL LOW
RBC: 5.16
WBC: 6.9

## 2011-06-26 ENCOUNTER — Other Ambulatory Visit: Payer: Self-pay | Admitting: Internal Medicine

## 2011-07-07 ENCOUNTER — Encounter: Payer: Self-pay | Admitting: Internal Medicine

## 2011-09-25 ENCOUNTER — Other Ambulatory Visit: Payer: Self-pay | Admitting: Internal Medicine

## 2011-10-01 ENCOUNTER — Encounter: Payer: Self-pay | Admitting: Cardiovascular Disease

## 2011-10-01 ENCOUNTER — Ambulatory Visit (INDEPENDENT_AMBULATORY_CARE_PROVIDER_SITE_OTHER): Payer: Medicare Other | Admitting: Cardiovascular Disease

## 2011-10-01 VITALS — BP 125/76 | HR 74 | Ht 64.5 in | Wt 204.0 lb

## 2011-10-01 DIAGNOSIS — E785 Hyperlipidemia, unspecified: Secondary | ICD-10-CM

## 2011-10-01 DIAGNOSIS — I2510 Atherosclerotic heart disease of native coronary artery without angina pectoris: Secondary | ICD-10-CM

## 2011-10-01 MED ORDER — ASPIRIN 81 MG PO TABS: 81.0000 mg | ORAL_TABLET | Freq: Every day | ORAL | Status: DC

## 2011-10-01 NOTE — Assessment & Plan Note (Signed)
Stable No changes 

## 2011-10-01 NOTE — Patient Instructions (Addendum)
Your physician wants you to follow-up in: 6 months.  You will receive a reminder letter in the mail two months in advance. If you don't receive a letter, please call our office to schedule the follow-up appointment.  Your physician recommends that you return for fasting lab work in: 6 months on day of appt with Dr. Clifton James   Your physician has recommended you make the following change in your medication: Decrease aspirin to 81 mg by mouth daily

## 2011-10-01 NOTE — Assessment & Plan Note (Signed)
Cholesterol is controlled. Will repeat LFTs, lipids in 6 months.

## 2011-10-01 NOTE — Progress Notes (Signed)
History of Present Illness: 77 yo WM with history of CAD, DM, HL, DJD here today for cardiac follow up. He has been followed in the past by Dr. Deborah Chalk. His cardiac history dates back to 19 when he had a stent placed in the Circumflex. He had a stent placed in the RCA in 2000. LVEF has been normal. Most recent stress test in July 2011 with LVEF of 68% and no ischemia. He tells me that he has chronic hip pain which limits his walking. No chest pain. No trouble breathing. He works in the yard and builds swings. Overall doing well.   Liipids in July 2012 with LDL of 89, total chol 145.    Past Medical History  Diagnosis Date  . CAD (coronary artery disease)   . Hyperlipidemia   . Diabetes mellitus     Past Surgical History  Procedure Date  . Carpal tunnel release   . Parathyroidectomy   . Eye surgery   . Inguinal hernia repair     Current Outpatient Prescriptions  Medication Sig Dispense Refill  . aspirin 325 MG tablet Take 325 mg by mouth daily.        Marland Kitchen atorvastatin (LIPITOR) 40 MG tablet TAKE 1 TABLET BY MOUTH EVERY NIGHT AT BEDTIME  30 tablet  0  . doxazosin (CARDURA) 2 MG tablet TAKE 1 TABLET BY MOUTH DAILY  30 tablet  5  . ibuprofen (ADVIL,MOTRIN) 200 MG tablet Take 200 mg by mouth every 6 (six) hours as needed.      . insulin regular (HUMULIN R) 100 units/mL injection Inject 100 Units into the skin 2 (two) times daily before a meal. 62 units in the am 32 units at bedtime      . lisinopril (PRINIVIL,ZESTRIL) 20 MG tablet TAKE 1 TABLET BY MOUTH DAILY  30 tablet  5  . metoprolol (LOPRESSOR) 50 MG tablet TAKE 1 TABLET BY MOUTH TWICE DAILY  60 tablet  5    Allergies  Allergen Reactions  . Aspirin     Hives H/O  . Morphine And Related Itching  . Penicillins Hives    History   Social History  . Marital Status: Married    Spouse Name: N/A    Number of Children: N/A  . Years of Education: N/A   Occupational History  . Not on file.   Social History Main Topics  .  Smoking status: Former Smoker    Quit date: 08/03/2010  . Smokeless tobacco: Current User    Types: Chew  . Alcohol Use: No  . Drug Use: No  . Sexually Active: Not on file   Other Topics Concern  . Not on file   Social History Narrative  . No narrative on file    Family History  Problem Relation Age of Onset  . Heart attack Paternal Aunt     Review of Systems:  As stated in the HPI and otherwise negative.   BP 125/76  Pulse 74  Ht 5' 4.5" (1.638 m)  Wt 204 lb (92.534 kg)  BMI 34.48 kg/m2  Physical Examination: General: Well developed, well nourished, NAD HEENT: OP clear, mucus membranes moist SKIN: warm, dry. No rashes. Neuro: No focal deficits Musculoskeletal: Muscle strength 5/5 all ext Psychiatric: Mood and affect normal Neck: No JVD, no carotid bruits, no thyromegaly, no lymphadenopathy. Lungs:Clear bilaterally, no wheezes, rhonci, crackles Cardiovascular: Regular rate and rhythm. No murmurs, gallops or rubs. Abdomen:Soft. Bowel sounds present. Non-tender.  Extremities: No lower extremity edema. Pulses are  2 + in the bilateral DP/PT.

## 2011-10-17 ENCOUNTER — Other Ambulatory Visit: Payer: Self-pay | Admitting: Internal Medicine

## 2011-10-17 ENCOUNTER — Encounter: Payer: Self-pay | Admitting: Internal Medicine

## 2011-10-17 ENCOUNTER — Ambulatory Visit (INDEPENDENT_AMBULATORY_CARE_PROVIDER_SITE_OTHER): Payer: Medicare Other | Admitting: Internal Medicine

## 2011-10-17 DIAGNOSIS — Z86010 Personal history of colonic polyps: Secondary | ICD-10-CM

## 2011-10-17 DIAGNOSIS — E785 Hyperlipidemia, unspecified: Secondary | ICD-10-CM

## 2011-10-17 DIAGNOSIS — I2510 Atherosclerotic heart disease of native coronary artery without angina pectoris: Secondary | ICD-10-CM

## 2011-10-17 DIAGNOSIS — E119 Type 2 diabetes mellitus without complications: Secondary | ICD-10-CM

## 2011-10-17 DIAGNOSIS — M4800 Spinal stenosis, site unspecified: Secondary | ICD-10-CM

## 2011-10-17 DIAGNOSIS — N400 Enlarged prostate without lower urinary tract symptoms: Secondary | ICD-10-CM

## 2011-10-17 DIAGNOSIS — Z8639 Personal history of other endocrine, nutritional and metabolic disease: Secondary | ICD-10-CM

## 2011-10-17 DIAGNOSIS — Z79899 Other long term (current) drug therapy: Secondary | ICD-10-CM

## 2011-10-17 DIAGNOSIS — Z23 Encounter for immunization: Secondary | ICD-10-CM

## 2011-10-17 MED ORDER — LISINOPRIL 20 MG PO TABS: 20.0000 mg | ORAL_TABLET | Freq: Every day | ORAL | Status: DC

## 2011-10-17 MED ORDER — ATORVASTATIN CALCIUM 40 MG PO TABS: 40.0000 mg | ORAL_TABLET | Freq: Every day | ORAL | Status: DC

## 2011-10-17 MED ORDER — DOXAZOSIN MESYLATE 2 MG PO TABS: 2.0000 mg | ORAL_TABLET | Freq: Every day | ORAL | Status: DC

## 2011-10-18 LAB — LIPID PANEL
HDL: 39 mg/dL — ABNORMAL LOW (ref >39)
LDL Cholesterol: 86 mg/dL (ref 0–99)
Triglycerides: 86 mg/dL (ref <150)

## 2011-10-18 LAB — HEPATIC FUNCTION PANEL
AST: 18 U/L (ref 0–37)
Alkaline Phosphatase: 76 U/L (ref 39–117)
Indirect Bilirubin: 0.5 mg/dL (ref 0.0–0.9)
Total Bilirubin: 0.7 mg/dL (ref 0.3–1.2)

## 2011-10-18 NOTE — Progress Notes (Signed)
  Subjective:    Patient ID: Joseph Mora, male    DOB: [birth year], 78 y.o.   MRN: [id number]  HPI 78 year old white male insulin-dependent diabetic with history of coronary artery disease status post MI in 1990 with a 100% circumflex lesion PTCA. History of hyperlipidemia, spinal stenosis, hyperparathyroidism status post parathyroid adenoma removal with normalization of calcium. Had colonoscopy 06/25/2009. History of adenomatous colon polyp. Was seen by endocrinologist in 2010 but patient does not want to go back endocrinologist at this point. He was diagnosed as a diabetic in 22 when he had an MI and was initially on Micronase. Had cataract extraction 2006 right. Had Cardiolite study July 2011 which was normal for ischemia. Left ventricular function was described as normal.  Says diabetes has not been running well controlled. History of labile blood sugars. Says he tries to follow a strict diet but it doesn't really matter he still gets wide fluctuations in Accu-Chek. He is good about taking his Accu-Cheks on a regular basis.    Review of Systems     Objective:   Physical Exam chest clear to auscultation; neck no carotid bruits; cardiac exam regular rate and rhythm; diabetic foot exam no ulcers or calluses.        Assessment & Plan:  Insulin-dependent diabetes  Coronary artery disease  Hyperlipidemia  BPH  History of adenomatous colon polyps  Status post parathyroid adenoma removal for hyperparathyroidism  Plan: Reminded about yearly eye exam. Prescription given for Zostavax vaccine to be done at drug store. Pneumococcal immunization declined by patient. He believes he had a systemic reaction to one given in 1997. Return in 6 months for physical exam. Fasting labs are pending

## 2011-10-18 NOTE — Patient Instructions (Addendum)
Continue to monitor Accu-Cheks. Return in 6 months for physical exam. Please call for sustained elevation in Accu-Cheks.

## 2011-10-29 ENCOUNTER — Ambulatory Visit (INDEPENDENT_AMBULATORY_CARE_PROVIDER_SITE_OTHER): Payer: Medicare Other | Admitting: Ophthalmology

## 2011-10-29 DIAGNOSIS — E11359 Type 2 diabetes mellitus with proliferative diabetic retinopathy without macular edema: Secondary | ICD-10-CM

## 2011-10-29 DIAGNOSIS — E1139 Type 2 diabetes mellitus with other diabetic ophthalmic complication: Secondary | ICD-10-CM

## 2011-10-29 DIAGNOSIS — H43819 Vitreous degeneration, unspecified eye: Secondary | ICD-10-CM

## 2011-11-17 ENCOUNTER — Other Ambulatory Visit: Payer: Self-pay | Admitting: Internal Medicine

## 2011-11-21 ENCOUNTER — Encounter: Payer: Self-pay | Admitting: Internal Medicine

## 2012-02-02 ENCOUNTER — Ambulatory Visit (INDEPENDENT_AMBULATORY_CARE_PROVIDER_SITE_OTHER): Payer: Medicare Other | Admitting: Internal Medicine

## 2012-02-02 ENCOUNTER — Encounter: Payer: Self-pay | Admitting: Internal Medicine

## 2012-02-02 VITALS — BP 116/56 | HR 76 | Ht 64.5 in | Wt 203.0 lb

## 2012-02-02 DIAGNOSIS — M1990 Unspecified osteoarthritis, unspecified site: Secondary | ICD-10-CM

## 2012-02-02 DIAGNOSIS — I2510 Atherosclerotic heart disease of native coronary artery without angina pectoris: Secondary | ICD-10-CM

## 2012-02-02 DIAGNOSIS — E119 Type 2 diabetes mellitus without complications: Secondary | ICD-10-CM

## 2012-02-02 DIAGNOSIS — E785 Hyperlipidemia, unspecified: Secondary | ICD-10-CM

## 2012-02-02 LAB — HEMOGLOBIN A1C
Hgb A1c MFr Bld: 9.6 % — ABNORMAL HIGH (ref <5.7)
Mean Plasma Glucose: 229 mg/dL — ABNORMAL HIGH (ref <117)

## 2012-02-02 NOTE — Patient Instructions (Signed)
Continue same medications. Hemoglobin A1c checked today. Return late summer for physical exam. DMV form completed.

## 2012-02-02 NOTE — Progress Notes (Signed)
  Subjective:    Patient ID: Joseph Mora, male    DOB: 04-04-1933, 78 y.o.   MRN: [id number]  HPI 78 year old white male in today with DMV forms to be completed. Dr. Marva Panda filled out his eye exam. He's been diagnosed with proliferative diabetic retinopathy. Patient has history of coronary artery disease. Had PTCA 1990 and a stent placed in right coronary artery in the year 2000. He had a negative cardiac stress test 2011. Now sees Dr. [place] for cardiology care. He takes a baby aspirin daily, Lipitor, lisinopril, Lopressor. He has insulin-dependent diabetes. Says fasting Accu-Chek this morning was 123. Sometimes it does run up as high as 300. Last hemoglobin A1c January was 9.1%. He continues to his vacation home in [place]. He drives a pickup and van. He is retired. He has not any recent motor vehicle accidents or recent citation.    Review of Systems     Objective:   Physical Exam chest clear to auscultation; cardiac exam regular rate and rhythm; extremities without edema insulin-dependent diabetes mellitus  History of proliferative diabetic retinopathy  Hyperlipidemia  Coronary artery disease  Plan: Return late summer for physical examination. Hemoglobin A1c checked today.        Assessment & Plan:

## 2012-02-20 ENCOUNTER — Other Ambulatory Visit: Payer: Self-pay | Admitting: *Deleted

## 2012-02-20 DIAGNOSIS — E785 Hyperlipidemia, unspecified: Secondary | ICD-10-CM

## 2012-02-23 ENCOUNTER — Other Ambulatory Visit: Payer: Self-pay | Admitting: Internal Medicine

## 2012-02-24 ENCOUNTER — Encounter: Payer: Self-pay | Admitting: Cardiology

## 2012-03-17 ENCOUNTER — Other Ambulatory Visit: Payer: Self-pay | Admitting: Internal Medicine

## 2012-03-23 ENCOUNTER — Other Ambulatory Visit (INDEPENDENT_AMBULATORY_CARE_PROVIDER_SITE_OTHER): Payer: Medicare Other

## 2012-03-23 DIAGNOSIS — E785 Hyperlipidemia, unspecified: Secondary | ICD-10-CM

## 2012-03-23 LAB — HEPATIC FUNCTION PANEL
ALT: 19 U/L (ref 0–53)
AST: 21 U/L (ref 0–37)
Albumin: 3.7 g/dL (ref 3.5–5.2)
Total Protein: 6.9 g/dL (ref 6.0–8.3)

## 2012-03-23 LAB — LIPID PANEL
HDL: 40.8 mg/dL (ref >39.00)
Triglycerides: 92 mg/dL (ref 0.0–149.0)
VLDL: 18.4 mg/dL (ref 0.0–40.0)

## 2012-03-30 ENCOUNTER — Encounter: Payer: Self-pay | Admitting: Cardiovascular Disease

## 2012-03-30 ENCOUNTER — Ambulatory Visit (INDEPENDENT_AMBULATORY_CARE_PROVIDER_SITE_OTHER): Payer: Medicare Other | Admitting: Cardiovascular Disease

## 2012-03-30 VITALS — BP 129/77 | HR 64 | Ht 64.0 in | Wt 202.0 lb

## 2012-03-30 DIAGNOSIS — I2510 Atherosclerotic heart disease of native coronary artery without angina pectoris: Secondary | ICD-10-CM

## 2012-03-30 NOTE — Progress Notes (Signed)
History of Present Illness: 78 yo Mora with history of CAD, DM, HL, DJD here today for cardiac follow up. He has been followed in the past by Dr. Deborah Chalk. His cardiac history dates back to 56 when he had a stent placed in the Circumflex. He had a stent placed in the RCA in 2000. LVEF has been normal. Most recent stress test in July 2011 with LVEF of 68% and no ischemia.   He is here today for follow up. He tells me that he has chronic hip pain which limits his walking. No chest pain. No trouble breathing. He works in the yard and builds swings. Overall doing well.   Primary Care Physician: Megan Salon Baxley  Last Lipid Profile:  Lipid Panel     Component Value Date/Time   CHOL 142 03/23/2012 0832   TRIG 92.0 03/23/2012 0832   HDL 40.Joseph 03/23/2012 0832   CHOLHDL 3 03/23/2012 0832   VLDL 18.4 03/23/2012 0832   LDLCALC 83 03/23/2012 0832      Past Medical History  Diagnosis Date  . CAD (coronary artery disease)   . Hyperlipidemia   . Diabetes mellitus     Past Surgical History  Procedure Date  . Carpal tunnel release   . Parathyroidectomy   . Eye surgery   . Inguinal hernia repair     Current Outpatient Prescriptions  Medication Sig Dispense Refill  . aspirin 81 MG tablet Take 1 tablet (81 mg total) by mouth daily.  30 tablet  6  . atorvastatin (LIPITOR) 40 MG tablet Take 1 tablet (40 mg total) by mouth daily.  30 tablet  11  . dimenhyDRINATE (DRAMAMINE) 50 MG tablet Take 50 mg by mouth every 8 (eight) hours as needed.      . doxazosin (CARDURA) 2 MG tablet Take 1 tablet (2 mg total) by mouth at bedtime.  30 tablet  11  . ibuprofen (ADVIL,MOTRIN) 200 MG tablet Take 200 mg by mouth every 6 (six) hours as needed.      . insulin NPH (HUMULIN N) 100 UNIT/ML injection       . Insulin Syringe-Needle U-100 (INSULIN SYRINGE 1CC/30GX5/16") 30G X 5/16" 1 ML MISC USE TWICE DAILY OR AS DIRECTED  100 each  6  . lisinopril (PRINIVIL,ZESTRIL) 20 MG tablet Take 1 tablet (20 mg total) by mouth daily.   30 tablet  11  . metoprolol (LOPRESSOR) 50 MG tablet TAKE 1 TABLET BY MOUTH TWICE DAILY  60 tablet  5  . DISCONTD: HUMULIN N 100 UNIT/ML injection INJECT 60 UNITS SUBCUTANEOUS BEFORE BREAKFAST AND 30 UNITS BEFORE SUPPER  30 mL  0    Allergies  Allergen Reactions  . Morphine And Related Itching  . Penicillins Hives    History   Social History  . Marital Status: Married    Spouse Name: N/A    Number of Children: N/A  . Years of Education: N/A   Occupational History  . Not on file.   Social History Main Topics  . Smoking status: Former Smoker    Quit date: 08/03/2010  . Smokeless tobacco: Current User    Types: Chew  . Alcohol Use: No  . Drug Use: No  . Sexually Active: Not on file   Other Topics Concern  . Not on file   Social History Narrative  . No narrative on file    Family History  Problem Relation Age of Onset  . Heart attack Paternal Aunt     Review of Systems:  As stated in the HPI and otherwise negative.   BP 129/77  Pulse 64  Ht 5\' 4"  (1.626 m)  Wt 202 lb (91.627 kg)  BMI 34.67 kg/m2  Physical Examination: General: Well developed, well nourished, NAD HEENT: OP clear, mucus membranes moist SKIN: warm, dry. No rashes. Neuro: No focal deficits Musculoskeletal: Muscle strength 5/5 all ext Psychiatric: Mood and affect normal Neck: No JVD, no carotid bruits, no thyromegaly, no lymphadenopathy. Lungs:Clear bilaterally, no wheezes, rhonci, crackles Cardiovascular: Regular rate and rhythm. No murmurs, gallops or rubs. Abdomen:Soft. Bowel sounds present. Non-tender.  Extremities: No lower extremity edema. Pulses are trace to 1 + in the bilateral DP/PT.

## 2012-03-30 NOTE — Assessment & Plan Note (Signed)
Stable. Will continue current therapy. BP and lipids are well controlled. Will probably arrange stress test at f/u in one year since his last was in 2011.

## 2012-03-30 NOTE — Patient Instructions (Addendum)
Your physician wants you to follow-up in:  12 months.  You will receive a reminder letter in the mail two months in advance. If you don't receive a letter, please call our office to schedule the follow-up appointment.   

## 2012-04-15 ENCOUNTER — Other Ambulatory Visit: Payer: Medicare Other | Admitting: Internal Medicine

## 2012-04-15 ENCOUNTER — Other Ambulatory Visit: Payer: Self-pay | Admitting: Internal Medicine

## 2012-04-16 ENCOUNTER — Encounter: Payer: Medicare Other | Admitting: Internal Medicine

## 2012-04-22 ENCOUNTER — Other Ambulatory Visit: Payer: Medicare Other | Admitting: Internal Medicine

## 2012-04-22 DIAGNOSIS — Z139 Encounter for screening, unspecified: Secondary | ICD-10-CM

## 2012-04-22 DIAGNOSIS — E119 Type 2 diabetes mellitus without complications: Secondary | ICD-10-CM

## 2012-04-22 DIAGNOSIS — E785 Hyperlipidemia, unspecified: Secondary | ICD-10-CM

## 2012-04-22 DIAGNOSIS — I2510 Atherosclerotic heart disease of native coronary artery without angina pectoris: Secondary | ICD-10-CM

## 2012-04-22 LAB — COMPREHENSIVE METABOLIC PANEL
ALT: 18 U/L (ref 0–53)
AST: 21 U/L (ref 0–37)
Calcium: 9.3 mg/dL (ref 8.4–10.5)
Chloride: 101 mEq/L (ref 96–112)
Creat: 0.96 mg/dL (ref 0.50–1.35)
Potassium: 4 mEq/L (ref 3.5–5.3)
Sodium: 140 mEq/L (ref 135–145)
Total Protein: 6.4 g/dL (ref 6.0–8.3)

## 2012-04-22 LAB — CBC WITH DIFFERENTIAL/PLATELET
Basophils Absolute: 0 10*3/uL (ref 0.0–0.1)
Eosinophils Relative: 3 % (ref 0–5)
Lymphocytes Relative: 22 % (ref 12–46)
Neutro Abs: 4.5 10*3/uL (ref 1.7–7.7)
Neutrophils Relative %: 66 % (ref 43–77)
Platelets: 133 10*3/uL — ABNORMAL LOW (ref 150–400)
RBC: 5.19 MIL/uL (ref 4.22–5.81)
RDW: 14 % (ref 11.5–15.5)
WBC: 6.8 10*3/uL (ref 4.0–10.5)

## 2012-04-22 LAB — HEMOGLOBIN A1C
Hgb A1c MFr Bld: 8.8 % — ABNORMAL HIGH (ref <5.7)
Mean Plasma Glucose: 206 mg/dL — ABNORMAL HIGH (ref <117)

## 2012-04-22 LAB — LIPID PANEL: Total CHOL/HDL Ratio: 1.9 Ratio

## 2012-04-23 ENCOUNTER — Ambulatory Visit (INDEPENDENT_AMBULATORY_CARE_PROVIDER_SITE_OTHER): Payer: Medicare Other | Admitting: Internal Medicine

## 2012-04-23 ENCOUNTER — Encounter: Payer: Self-pay | Admitting: Internal Medicine

## 2012-04-23 VITALS — BP 128/74 | HR 92 | Temp 98.1°F | Ht 64.5 in | Wt 207.0 lb

## 2012-04-23 DIAGNOSIS — E785 Hyperlipidemia, unspecified: Secondary | ICD-10-CM

## 2012-04-23 DIAGNOSIS — Z0000 Encounter for general adult medical examination without abnormal findings: Secondary | ICD-10-CM

## 2012-04-23 DIAGNOSIS — E119 Type 2 diabetes mellitus without complications: Secondary | ICD-10-CM

## 2012-04-23 DIAGNOSIS — I10 Essential (primary) hypertension: Secondary | ICD-10-CM

## 2012-04-23 NOTE — Progress Notes (Signed)
Subjective:    Patient ID: Joseph Mora, male    DOB: 07-05-1933, 78 y.o.   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 History of coronary artery disease status post MI with PTCA 4 100% occluded circumflex artery September 1990. Patient had a CMV infection 1997. He initially thought this was a Pneumovax immunization reaction but CMV was proven. Patient was diagnosed with H. pylori gastritis March 1999. He had a screening colonoscopy by Dr. Loreta Ave in October 2004. History of right inguinal hernia repair 1953. Lumbar disc surgery L4-L5 and L5-S1 in 1998. Cataract extraction right eye 2006. Also had cardiac catheterization with stent placed to right coronary artery in September 2000. Had surgery for chronic left ulnar neuropathy due to diabetes and a left carpal tunnel release done by Dr. Teressa Senter in March 2012.  Had surgery for left superior parathyroid adenoma September 2011. History of adenomatous colon polyps. Had Pneumovax vaccine 1997 but we have not revaccinated TM because he believes he had a reaction to the vaccine.  In 1999 he was hospitalized for back surgery and subsequently developed a GI bleed requiring emergency surgery with gastroduodenostomy, pyloroplasty, truncal vagotomy. A large bleeding duodenal ulcer was detected. That is when H. pylori was diagnosed. He quit smoking in 1990. He does chew tobacco. Does not consume alcohol.  He is married, has adult children and has raised a number of foster children over the years.  Family history: Father died at age 42 struck by a motor vehicle; mother died at age 53 with history of diabetes mellitus, stroke and Alzheimer's disease. One brother died at age 13 with history of colon cancer, alcoholism, an MI. 7 sisters and 7 brothers  living. One brother with adult onset diabetes, 3 sisters with diabetes mellitus.  Says he's able to walk a mile daily but his back hurts a lot.    Review of Systems  Constitutional: Positive for fatigue.  HENT: Negative.        Cerumen both ears  Eyes: Negative.   Respiratory: Negative for cough, chest tightness and shortness of breath.   Cardiovascular: Negative for chest pain.  Genitourinary:       Some hesitancy with urine  Musculoskeletal: Positive for back pain.  Neurological: Negative.   Hematological: Negative.   Psychiatric/Behavioral: Negative.        Objective:   Physical Exam  Nursing note and vitals reviewed. Constitutional: He appears well-developed and well-nourished. No distress.  HENT:  Head: Normocephalic and atraumatic.  Right Ear: External ear normal.  Left Ear: External ear normal.  Mouth/Throat: Oropharynx is clear and moist. No oropharyngeal exudate.       Cerumen in the ears  Eyes: Conjunctivae and EOM are normal. Pupils are equal, round, and reactive to light. Right eye exhibits no discharge. Left eye exhibits no discharge. No scleral icterus.  Neck: Neck supple. No JVD present. No thyromegaly present.  Cardiovascular: Normal rate, regular rhythm, normal heart sounds and intact distal pulses.   No murmur heard. Pulmonary/Chest: Effort normal and breath sounds normal. He has no wheezes. He has no rales. He exhibits no tenderness.  Abdominal: Soft. Bowel sounds are normal. He exhibits no distension and no mass. There is no tenderness. There is no rebound and no guarding.       Ventral hernia  Genitourinary: Penis normal.  Prostate enlarged without nontender  Musculoskeletal: Normal range of motion. He exhibits no edema.  Lymphadenopathy:    He has no cervical adenopathy.  Skin: Skin is warm and dry. He is not diaphoretic.       Onychomycosis both feet  Psychiatric: He has a normal mood and affect. His behavior is normal. Judgment and thought  content normal.          Assessment & Plan:   Insulin-dependent diabetes with fair control  Hyperlipidemia-under good control  Hypertension well controlled  BPH  Chronic back pain related to multiple operations and spinal stenosis  History of hyperparathyroidism noted with elevated serum calcium status post surgery to remove adenoma September 2011  History of adenomatous colon polyp/last colonoscopy 2004  Remote history of Helicobacter pylori infection with large gastric ulcer  Plan: Return in 6 months and continue same medications. Recommend annual influenza immunization. Recommended Zostavax vaccine.     Subjective:   Patient presents for Medicare Annual/Subsequent preventive examination.   Review Past Medical/Family/Social:   Risk Factors  Current exercise habits:  Walks 1/4 mile tid Dietary issues discussed: 1800 calorie 4 gram sodium fat modified  Cardiac risk factors: Hyperlipidemia, coronary disease documented, hypertension  Depression Screen  (Note: if answer to either of the following is "Yes", a more complete depression screening is indicated)  Over the past two weeks, have you felt down, depressed or hopeless? No Over the past two weeks, have you felt little interest or pleasure in doing things? No Have you lost interest or pleasure in daily life? No Do you often feel hopeless? No Do you cry easily over simple problems? No   Activities of Daily Living  In your present state of health, do you have any difficulty performing the following activities?:  Driving? No  Managing money? No  Feeding yourself? No  Getting from bed to chair? No  Climbing a flight of stairs? Yes due to orthopedic issues Preparing food and eating?: No  Bathing or showering? No  Getting dressed: No  Getting to the toilet? No  Using the toilet:No  Moving around from place to place: No  In the past year have you fallen or had a near fall?: yes- legs gave away- hx hip pain Are  you sexually active? No  Do you have more than one partner? No   Hearing Difficulties: No  Do you often ask people to speak up or repeat themselves? Yes- has hearing aid in right ear Do you experience ringing or noises in your ears? no Do you have difficulty understanding soft or whispered voices? yes  Do you feel that you have a problem with memory? no Do you often misplace items? No  Do you feel safe at home? Yes   Cognitive Testing  Alert? Yes Normal Appearance?Yes  Oriented to person? Yes Place? Yes  Time? Yes  Recall of three objects? Yes  Can perform simple calculations? Yes  Displays appropriate judgment?Yes  Can read the correct time from a watch face?Yes   List the Names of Other Physician/Practitioners you currently use: [facility] in research project for lipids   Indicate any recent Medical Services you may have received from other than [facility] providers in the past year (date may be approximate).   Screening Tests / Date Colonoscopy  2004                   Zostavax -recommended Mammogram not applicable patient is a male Influenza Vaccine -reminded Tetanus/tdap   Objective:  General appearance: Appears stated age  Head: Normocephalic, without obvious abnormality, atraumatic  Eyes: conj clear, EOMi PEERLA  Ears: normal TM's and external ear canals both ears  Nose: Nares normal. Septum midline. Mucosa normal. No drainage or sinus tenderness.  Throat: lips, mucosa, and tongue normal; teeth and gums normal  Neck: no adenopathy, no carotid bruit, no JVD, supple, symmetrical, trachea midline and thyroid not enlarged, symmetric, no tenderness/mass/nodules  No CVA tenderness.  Lungs: clear to auscultation bilaterally  Breasts: normal appearance, no masses or tenderness,  Heart: regular rate and rhythm, S1, S2 normal, no murmur, click, rub or gallop  Abdomen: soft, non-tender; bowel sounds normal; no masses, no organomegaly  Musculoskeletal: ROM normal in  all joints, no crepitus, no deformity, Normal muscle strengthen. Back  is symmetric, no curvature. Skin: Skin color, texture, turgor normal. No rashes or lesions  Lymph nodes: Cervical, supraclavicular, and axillary nodes normal.  Neurologic: CN 2 -12 Normal, Normal symmetric reflexes. Normal coordination and gait  Psych: Alert & Oriented x 3, Mood appear stable.    Assessment:    Annual wellness medicare exam   Plan:    During the course of the visit the patient was educated and counseled about appropriate screening and preventive services including Colonoscopy done 06/25/2009; recommend annual influenza immunization; recommend Zostavax vaccine    Diet review for nutrition referral? Yes ____ Not Indicated __x__ does not want to go Patient Instructions (the written plan) was given to the patient.  Medicare Attestation  I have personally reviewed:  The patient's medical and social history  Their use of alcohol, tobacco or illicit drugs  Their current medications and supplements  The patient's functional ability including ADLs,fall risks, home safety risks, cognitive, and hearing and visual impairment  Diet and physical activities  Evidence for depression or mood disorders  The patient's weight, height, BMI, and visual acuity have been recorded in the chart. I have made referrals, counseling, and provided education to the patient based on review of the above and I have provided the patient with a written personalized care plan for preventive services.    Marland Kitchen1

## 2012-04-27 ENCOUNTER — Ambulatory Visit (INDEPENDENT_AMBULATORY_CARE_PROVIDER_SITE_OTHER): Payer: Medicare Other | Admitting: Ophthalmology

## 2012-04-27 DIAGNOSIS — H35039 Hypertensive retinopathy, unspecified eye: Secondary | ICD-10-CM

## 2012-04-27 DIAGNOSIS — H43819 Vitreous degeneration, unspecified eye: Secondary | ICD-10-CM

## 2012-04-27 DIAGNOSIS — E11359 Type 2 diabetes mellitus with proliferative diabetic retinopathy without macular edema: Secondary | ICD-10-CM

## 2012-04-27 DIAGNOSIS — E1139 Type 2 diabetes mellitus with other diabetic ophthalmic complication: Secondary | ICD-10-CM

## 2012-04-27 DIAGNOSIS — I10 Essential (primary) hypertension: Secondary | ICD-10-CM

## 2012-05-20 ENCOUNTER — Other Ambulatory Visit: Payer: Self-pay

## 2012-05-20 MED ORDER — INSULIN NPH (HUMAN) (ISOPHANE) 100 UNIT/ML ~~LOC~~ SUSP: 60.0000 [IU] | Freq: Every day | SUBCUTANEOUS | Status: DC

## 2012-05-23 DIAGNOSIS — I10 Essential (primary) hypertension: Secondary | ICD-10-CM | POA: Insufficient documentation

## 2012-05-23 NOTE — Patient Instructions (Addendum)
Continue same medications and return in 6 months 

## 2012-06-23 ENCOUNTER — Telehealth: Payer: Self-pay | Admitting: Internal Medicine

## 2012-09-01 ENCOUNTER — Other Ambulatory Visit: Payer: Self-pay

## 2012-09-01 MED ORDER — METOPROLOL TARTRATE 50 MG PO TABS: 50.0000 mg | ORAL_TABLET | Freq: Two times a day (BID) | ORAL | Status: DC

## 2012-09-01 MED ORDER — DOXAZOSIN MESYLATE 2 MG PO TABS: 2.0000 mg | ORAL_TABLET | Freq: Every day | ORAL | Status: DC

## 2012-10-13 ENCOUNTER — Other Ambulatory Visit: Payer: Self-pay

## 2012-10-13 MED ORDER — LISINOPRIL 20 MG PO TABS: 20.0000 mg | ORAL_TABLET | Freq: Every day | ORAL | Status: DC

## 2012-10-25 ENCOUNTER — Other Ambulatory Visit: Payer: Self-pay | Admitting: Internal Medicine

## 2012-10-25 ENCOUNTER — Other Ambulatory Visit: Payer: Medicare Other | Admitting: Internal Medicine

## 2012-10-25 DIAGNOSIS — E119 Type 2 diabetes mellitus without complications: Secondary | ICD-10-CM

## 2012-10-25 DIAGNOSIS — Z79899 Other long term (current) drug therapy: Secondary | ICD-10-CM

## 2012-10-25 DIAGNOSIS — E785 Hyperlipidemia, unspecified: Secondary | ICD-10-CM

## 2012-10-25 LAB — HEMOGLOBIN A1C: Mean Plasma Glucose: 194 mg/dL — ABNORMAL HIGH (ref <117)

## 2012-10-25 LAB — HEPATIC FUNCTION PANEL
ALT: 17 U/L (ref 0–53)
Bilirubin, Direct: 0.2 mg/dL (ref 0.0–0.3)
Indirect Bilirubin: 0.6 mg/dL (ref 0.0–0.9)

## 2012-10-25 LAB — LIPID PANEL
Cholesterol: 150 mg/dL (ref 0–200)
LDL Cholesterol: 49 mg/dL (ref 0–99)
VLDL: 16 mg/dL (ref 0–40)

## 2012-10-26 ENCOUNTER — Ambulatory Visit (INDEPENDENT_AMBULATORY_CARE_PROVIDER_SITE_OTHER): Payer: Medicare Other | Admitting: Internal Medicine

## 2012-10-26 ENCOUNTER — Encounter: Payer: Self-pay | Admitting: Internal Medicine

## 2012-10-26 VITALS — BP 126/66 | HR 80 | Temp 97.7°F | Wt 206.0 lb

## 2012-10-26 DIAGNOSIS — E162 Hypoglycemia, unspecified: Secondary | ICD-10-CM

## 2012-10-26 DIAGNOSIS — IMO0001 Reserved for inherently not codable concepts without codable children: Secondary | ICD-10-CM

## 2012-10-26 DIAGNOSIS — E785 Hyperlipidemia, unspecified: Secondary | ICD-10-CM

## 2012-10-26 DIAGNOSIS — Z794 Long term (current) use of insulin: Secondary | ICD-10-CM

## 2012-10-26 DIAGNOSIS — E119 Type 2 diabetes mellitus without complications: Secondary | ICD-10-CM

## 2012-10-26 DIAGNOSIS — R32 Unspecified urinary incontinence: Secondary | ICD-10-CM

## 2012-10-26 DIAGNOSIS — N400 Enlarged prostate without lower urinary tract symptoms: Secondary | ICD-10-CM

## 2012-10-26 LAB — POCT URINALYSIS DIPSTICK
Bilirubin, UA: NEGATIVE
Glucose, UA: NEGATIVE
Leukocytes, UA: NEGATIVE
Nitrite, UA: NEGATIVE

## 2012-10-26 LAB — PSA: PSA: 1.4 ng/mL (ref <=4.00)

## 2012-10-26 NOTE — Patient Instructions (Addendum)
Take Flomax 0.4 mg daily for urinary leakage. Return in 4 weeks for evaluation. PSA will be added. Watch for hypoglycemia since you go to bed early and do not eat a snack

## 2012-10-28 ENCOUNTER — Ambulatory Visit (INDEPENDENT_AMBULATORY_CARE_PROVIDER_SITE_OTHER): Payer: Medicare Other | Admitting: Ophthalmology

## 2012-10-28 DIAGNOSIS — H35039 Hypertensive retinopathy, unspecified eye: Secondary | ICD-10-CM

## 2012-10-28 DIAGNOSIS — E11359 Type 2 diabetes mellitus with proliferative diabetic retinopathy without macular edema: Secondary | ICD-10-CM

## 2012-10-28 DIAGNOSIS — H43819 Vitreous degeneration, unspecified eye: Secondary | ICD-10-CM

## 2012-10-28 DIAGNOSIS — E1139 Type 2 diabetes mellitus with other diabetic ophthalmic complication: Secondary | ICD-10-CM

## 2012-10-28 DIAGNOSIS — I10 Essential (primary) hypertension: Secondary | ICD-10-CM

## 2012-11-20 NOTE — Progress Notes (Signed)
  Subjective:    Patient ID: Joseph Mora, male    DOB: [birth year], 79 y.o.   MRN: [id number]  HPI 79 year old white male with history of insulin-dependent diabetes, hyperlipidemia, coronary artery disease in for six-month recheck. Has chronic back pain secondary to spinal stenosis lumbar disc at L3-L4. History of MI with PTCA for 100% occluded circumflex artery September 1990. Also had cardiac catheterization with stent placed right coronary artery September 2000. Had surgery for left superior parathyroid adenoma September 2011. History of adenomatous colon polyps.  He had Pneumovax vaccine in 1997. He thought he had a reaction to the vaccine but he had a CMV infection and stated. Have her we have not revaccinated him because he believes he had a reaction to the vaccine.  In 1999 he was hospitalized for back surgery, subsequently developed a GI bleed requiring emergency surgery with gastroduodenostomy, pyloroplasty, truncal vagotomy. A large bleeding duodenal ulcer was detected. H. pylori was diagnosed. He quit smoking in 1990. He chews tobacco. Does not consume alcohol.  History of surgery for chronic left ulnar neuropathy due to diabetes. Left carpal tunnel release. History of BPH.  Colonoscopy done October 2010.  New complaint today is some urinary leakage. History of BPH. Will try Flomax.    Review of Systems     Objective:   Physical Exam skin is warm and dry. Neck is supple without JVD thyromegaly or carotid bruits. Chest clear to auscultation without rales or wheezing. Cardiac exam regular rate and rhythm normal S1 and S2 without murmurs or gallops. Extremities without edema. Diabetic foot exam shows no ulcers or calluses.        Assessment & Plan:  Insulin-dependent diabetes mellitus. Diabetic control is fair. We've tried to improve but he does develop episodes of hypoglycemia. He has seen an endocrinologist in the past who recommended his current regimen. I think are doing  about as well as we can with his control and lifestyle. He goes to bed very early at night and eats of an early supper. He does walk for exercise.  Coronary artery disease-stable  Chronic back pain-limits ability to exercise  Hypertension-stable  Hyperlipidemia  BPH- having some issues with urinary leakage. Try Flomax 0.4 mg daily and return in 4 weeks Plan: Return in 6 months for physical exam. No change in medication regimen.

## 2012-11-22 ENCOUNTER — Ambulatory Visit (INDEPENDENT_AMBULATORY_CARE_PROVIDER_SITE_OTHER): Payer: Medicare Other | Admitting: Internal Medicine

## 2012-11-22 ENCOUNTER — Encounter: Payer: Self-pay | Admitting: Internal Medicine

## 2012-11-22 VITALS — BP 128/80 | HR 76 | Temp 96.1°F | Wt 200.0 lb

## 2012-11-22 DIAGNOSIS — Z794 Long term (current) use of insulin: Secondary | ICD-10-CM

## 2012-11-22 DIAGNOSIS — E119 Type 2 diabetes mellitus without complications: Secondary | ICD-10-CM

## 2012-11-22 DIAGNOSIS — N39490 Overflow incontinence: Secondary | ICD-10-CM

## 2012-11-22 MED ORDER — TAMSULOSIN HCL 0.4 MG PO CAPS: 0.4000 mg | ORAL_CAPSULE | Freq: Every day | ORAL | Status: DC

## 2012-11-22 NOTE — Progress Notes (Signed)
  Subjective:    Patient ID: Joseph Mora, male    DOB: [birth year], 79 y.o.   MRN: [id number]  HPI 79 year old White male with history of BPH and overflow incontinence. He was having some issues with dribbling and urinary incontinence at last visit. I placed him on generic Flomax 0.4 mg daily. Currently has stopped and he has had no further episodes of incontinence. He is very pleased with this. He is an insulin-dependent diabetic. He also has history of hypertension and hyperlipidemia. This morning he was making up a bed and strained his back. Has a history of chronic back pain status post lumbar surgeries.    Review of Systems     Objective:   Physical Exam chest is clear to auscultation. Cardiac exam regular rate and rhythm normal S1 and S2. Extremities without edema.        Assessment & Plan:  Urinary incontinence  BPH  Chronic back pain  Plan: Reorder Flomax generic for one year. Return in 6 months for physical exam. Treat back pain conservatively.

## 2012-11-22 NOTE — Patient Instructions (Addendum)
Continue Flomax and return here in 6 months for CPE

## 2012-12-01 ENCOUNTER — Other Ambulatory Visit: Payer: Self-pay

## 2012-12-01 MED ORDER — ATORVASTATIN CALCIUM 40 MG PO TABS: 40.0000 mg | ORAL_TABLET | Freq: Every day | ORAL | Status: DC

## 2013-01-31 ENCOUNTER — Encounter: Payer: Self-pay | Admitting: Cardiology

## 2013-03-16 ENCOUNTER — Other Ambulatory Visit: Payer: Self-pay | Admitting: Internal Medicine

## 2013-03-31 ENCOUNTER — Encounter: Payer: Self-pay | Admitting: Cardiovascular Disease

## 2013-03-31 ENCOUNTER — Ambulatory Visit (INDEPENDENT_AMBULATORY_CARE_PROVIDER_SITE_OTHER): Payer: Medicare Other | Admitting: Cardiovascular Disease

## 2013-03-31 VITALS — BP 140/80 | HR 75

## 2013-03-31 DIAGNOSIS — I2510 Atherosclerotic heart disease of native coronary artery without angina pectoris: Secondary | ICD-10-CM

## 2013-03-31 NOTE — Patient Instructions (Addendum)
Your physician wants you to follow-up in:  12 months. You will receive a reminder letter in the mail two months in advance. If you don't receive a letter, please call our office to schedule the follow-up appointment.  Your physician has requested that you have a lexiscan myoview. For further information please visit [email or web address]. Please follow instruction sheet, as given.    

## 2013-03-31 NOTE — Progress Notes (Signed)
History of Present Illness: 79 yo WM with history of CAD, DM, HL, DJD here today for cardiac follow up. He has been followed in the past by Dr. Deborah Chalk. His cardiac history dates back to 78 when he had a stent placed in the Circumflex. He had a stent placed in the RCA in 2000. LVEF has been normal. Most recent stress test in July 2011 with LVEF of 68% and no ischemia.   He is here today for follow up. He tells me that he has chronic hip pain which limits his walking. No chest pain or SOB.   Primary Care Physician: Megan Salon Baxley  Last Lipid Profile:Lipid Panel     Component Value Date/Time   CHOL 150 10/25/2012 0905   TRIG 78 10/25/2012 0905   HDL 85 10/25/2012 0905   CHOLHDL 1.8 10/25/2012 0905   VLDL 16 10/25/2012 0905   LDLCALC 49 10/25/2012 0905     Past Medical History  Diagnosis Date  . CAD (coronary artery disease)   . Hyperlipidemia   . Diabetes mellitus     Past Surgical History  Procedure Laterality Date  . Carpal tunnel release    . Parathyroidectomy    . Eye surgery    . Inguinal hernia repair      Current Outpatient Prescriptions  Medication Sig Dispense Refill  . aspirin 81 MG tablet Take 1 tablet (81 mg total) by mouth daily.  30 tablet  6  . atorvastatin (LIPITOR) 40 MG tablet Take 1 tablet (40 mg total) by mouth daily.  30 tablet  6  . dimenhyDRINATE (DRAMAMINE) 50 MG tablet Take 50 mg by mouth every 8 (eight) hours as needed.      . doxazosin (CARDURA) 2 MG tablet Take 1 tablet (2 mg total) by mouth at bedtime.  30 tablet  11  . ibuprofen (ADVIL,MOTRIN) 200 MG tablet Take 200 mg by mouth every 6 (six) hours as needed.      . insulin NPH (HUMULIN N) 100 UNIT/ML injection Inject 60 Units into the skin daily before breakfast. 30 units before dinner  30 mL  11  . Insulin Syringe-Needle U-100 (INSULIN SYRINGE 1CC/30GX5/16") 30G X 5/16" 1 ML MISC USE TWICE A DAY OR AS DIRECTED  100 each  0  . Insulin Syringe-Needle U-100 (INSULIN SYRINGE 1CC/30GX5/16") 30G X 5/16" 1  ML MISC USE TWICE DAILY OR AS DIRECTED  100 each  7  . Insulin Syringes, Disposable, U-100 1 ML MISC       . lisinopril (PRINIVIL,ZESTRIL) 20 MG tablet Take 1 tablet (20 mg total) by mouth daily.  30 tablet  11  . metoprolol (LOPRESSOR) 50 MG tablet Take 1 tablet (50 mg total) by mouth 2 (two) times daily.  60 tablet  11  . tamsulosin (FLOMAX) 0.4 MG CAPS Take 1 capsule (0.4 mg total) by mouth daily.  30 capsule  11   No current facility-administered medications for this visit.    Allergies  Allergen Reactions  . Morphine And Related Itching  . Penicillins Hives    History   Social History  . Marital Status: Married    Spouse Name: N/A    Number of Children: N/A  . Years of Education: N/A   Occupational History  . Not on file.   Social History Main Topics  . Smoking status: Former Smoker    Quit date: 08/03/2010  . Smokeless tobacco: Current User    Types: Chew  . Alcohol Use: No  . Drug  Use: No  . Sexually Active: Not on file   Other Topics Concern  . Not on file   Social History Narrative  . No narrative on file    Family History  Problem Relation Age of Onset  . Heart attack Paternal Aunt     Review of Systems:  As stated in the HPI and otherwise negative.   BP 140/80  Pulse 75  Physical Examination: General: Well developed, well nourished, NAD HEENT: OP clear, mucus membranes moist SKIN: warm, dry. No rashes. Neuro: No focal deficits Musculoskeletal: Muscle strength 5/5 all ext Psychiatric: Mood and affect normal Neck: No JVD, no carotid bruits, no thyromegaly, no lymphadenopathy. Lungs:Clear bilaterally, no wheezes, rhonci, crackles Cardiovascular: Regular rate and rhythm. No murmurs, gallops or rubs. Abdomen:Soft. Bowel sounds present. Non-tender.  Extremities: No lower extremity edema. Pulses are 2 + in the bilateral DP/PT.  EKG: NSR, rate 75 bpm.   Assessment and Plan:   1. CAD:  Stable. Will continue current therapy. BP and lipids are well  controlled. Will arrange Lexiscan stress myoview to exclude ischemia since it has been three years since last ischemic evaluation.

## 2013-04-06 ENCOUNTER — Ambulatory Visit (HOSPITAL_COMMUNITY): Payer: Medicare Other | Attending: Cardiology | Admitting: Radiology

## 2013-04-06 VITALS — BP 122/68 | HR 62 | Ht 64.0 in | Wt 198.0 lb

## 2013-04-06 DIAGNOSIS — I252 Old myocardial infarction: Secondary | ICD-10-CM | POA: Insufficient documentation

## 2013-04-06 DIAGNOSIS — E119 Type 2 diabetes mellitus without complications: Secondary | ICD-10-CM | POA: Insufficient documentation

## 2013-04-06 DIAGNOSIS — Z9861 Coronary angioplasty status: Secondary | ICD-10-CM | POA: Insufficient documentation

## 2013-04-06 DIAGNOSIS — E663 Overweight: Secondary | ICD-10-CM | POA: Insufficient documentation

## 2013-04-06 DIAGNOSIS — I2510 Atherosclerotic heart disease of native coronary artery without angina pectoris: Secondary | ICD-10-CM

## 2013-04-06 DIAGNOSIS — Z794 Long term (current) use of insulin: Secondary | ICD-10-CM | POA: Insufficient documentation

## 2013-04-06 DIAGNOSIS — I10 Essential (primary) hypertension: Secondary | ICD-10-CM | POA: Insufficient documentation

## 2013-04-06 DIAGNOSIS — E785 Hyperlipidemia, unspecified: Secondary | ICD-10-CM | POA: Insufficient documentation

## 2013-04-06 DIAGNOSIS — Z87891 Personal history of nicotine dependence: Secondary | ICD-10-CM | POA: Insufficient documentation

## 2013-04-06 DIAGNOSIS — R42 Dizziness and giddiness: Secondary | ICD-10-CM | POA: Insufficient documentation

## 2013-04-06 MED ORDER — REGADENOSON 0.4 MG/5ML IV SOLN
0.4000 mg | Freq: Once | INTRAVENOUS | Status: AC
  Administered 2013-04-06: 0.4 mg via INTRAVENOUS

## 2013-04-06 MED ORDER — TECHNETIUM TC 99M SESTAMIBI GENERIC - CARDIOLITE
33.0000 | Freq: Once | INTRAVENOUS | Status: AC | PRN
  Administered 2013-04-06: 33 via INTRAVENOUS

## 2013-04-06 MED ORDER — TECHNETIUM TC 99M SESTAMIBI GENERIC - CARDIOLITE
11.0000 | Freq: Once | INTRAVENOUS | Status: AC | PRN
  Administered 2013-04-06: 11 via INTRAVENOUS

## 2013-04-06 NOTE — Progress Notes (Signed)
[facility] SITE 3 NUCLEAR MED [address] [place], [place] [address] [phone]    Cardiology Nuclear Med Study  Joseph Mora is a 79 y.o. male     MRN : [id number]     DOB: 13-May-1933  Procedure Date: 04/06/2013  Nuclear Med Background Indication for Stress Test:  Evaluation for Ischemia and PTCA/Stent Patency History:  '90 LWMI>PTCA/Stent-LCFX; '00 Stent-RCA, EF=55%; '11 MPS:no ischemia, EF=68% Cardiac Risk Factors: History of Smoking, Hypertension, IDDM Type 2, Lipids and Overweight  Symptoms:  Dizziness and Light-Headedness   Nuclear Pre-Procedure Caffeine/Decaff Intake:  None> 12 hrs NPO After: 5:00pm   Lungs:  Clear. O2 Sat: 96% on room air. IV 0.9% NS with Angio Cath:  22g  IV Site: R Antecubital x 1, tolerated well IV Started by:  Irean Hong, RN  Chest Size (in):  42 Cup Size: n/a  Height: 5\' 4"  (1.626 m)  Weight:  198 lb (89.812 kg)  BMI:  Body mass index is 33.97 kg/(m^2). Tech Comments:  Fasting CBG was 111@ 5:00 am; no insulin today.Took Lopressor this am.    Nuclear Med Study 1 or 2 day study: 1 day  Stress Test Type:  Eugenie Birks  Reading MD: Marca Ancona, MD  Order Authorizing Provider:  Verne Carrow, MD  Resting Radionuclide: Technetium 46m Sestamibi  Resting Radionuclide Dose: 11.0 mCi   Stress Radionuclide:  Technetium 55m Sestamibi  Stress Radionuclide Dose: 33.0 mCi           Stress Protocol Rest HR: 62 Stress HR: 87  Rest BP: 122/68 Stress BP: 124/69  Exercise Time (min): n/a METS: n/a   Predicted Max HR: 142 bpm % Max HR: 61.27 bpm Rate Pressure Product: 29528   Dose of Adenosine (mg):  n/a Dose of Lexiscan: 0.4 mg  Dose of Atropine (mg): n/a Dose of Dobutamine: n/a mcg/kg/min (at max HR)  Stress Test Technologist: Smiley Houseman, CMA-N  Nuclear Technologist:  Domenic Polite, CNMT     Rest Procedure:  Myocardial perfusion imaging was performed at rest 45 minutes following the intravenous administration of  Technetium 14m Sestamibi.  Rest ECG: NSR - Normal EKG  Stress Procedure:  The patient received IV Lexiscan 0.4 mg over 15-seconds.  Technetium 42m Sestamibi injected at 30-seconds.  He did c/o chest pressure with Lexiscan.  Quantitative spect images were obtained after a 45 minute delay.  Stress ECG: No significant change from baseline ECG  QPS Raw Data Images:  Normal; no motion artifact; normal heart/lung ratio. Stress Images:  There is a small, mild apical anterolateral perfusion defect.  There is a small to medium-sized, moderate intensity basal inferior and inferolateral perfusion defect. Rest Images:  There is a small to medium-sized, moderate intensity basal inferior and inferolateral perfusion defect. Subtraction (SDS):  Reversible apical anterolateral defect.  Fixed basal inferior and inferolateral defect. Transient Ischemic Dilatation (Normal <1.22):  n/a Lung/Heart Ratio (Normal <0.45):  0.52  Quantitative Gated Spect Images QGS EDV:  65 ml QGS ESV:  28 ml  Impression Exercise Capacity:  Lexiscan with no exercise. BP Response:  Hypotensive blood pressure response. Clinical Symptoms:  Chest pressure.  ECG Impression:  No significant ST segment change suggestive of ischemia. Comparison with Prior Nuclear Study: No images to compare  Overall Impression:  Difficult study.  There is a reversible mild apical anterolateral perfusion defect that may represent ischemia.  There is a fixed basal inferior and inferolateral perfusion defect that may be artifact but cannot rule out prior infarction.  There  was normal wall motion so suggests artifact.  LV Ejection Fraction: 57%.  LV Wall Motion:  NL LV Function; NL Wall Motion  Marca Ancona 04/06/2013

## 2013-05-02 ENCOUNTER — Other Ambulatory Visit: Payer: Medicare Other | Admitting: Internal Medicine

## 2013-05-02 DIAGNOSIS — E119 Type 2 diabetes mellitus without complications: Secondary | ICD-10-CM

## 2013-05-02 DIAGNOSIS — E789 Disorder of lipoprotein metabolism, unspecified: Secondary | ICD-10-CM

## 2013-05-02 DIAGNOSIS — Z79899 Other long term (current) drug therapy: Secondary | ICD-10-CM

## 2013-05-02 DIAGNOSIS — Z130 Encounter for screening for diseases of the blood and blood-forming organs and certain disorders involving the immune mechanism: Secondary | ICD-10-CM

## 2013-05-02 DIAGNOSIS — Z125 Encounter for screening for malignant neoplasm of prostate: Secondary | ICD-10-CM

## 2013-05-02 LAB — LIPID PANEL
HDL: 88 mg/dL (ref >39)
LDL Cholesterol: 42 mg/dL (ref 0–99)
Total CHOL/HDL Ratio: 1.7 Ratio

## 2013-05-02 LAB — PSA: PSA: 1.18 ng/mL (ref <=4.00)

## 2013-05-02 LAB — COMPREHENSIVE METABOLIC PANEL
ALT: 18 U/L (ref 0–53)
AST: 17 U/L (ref 0–37)
Albumin: 3.9 g/dL (ref 3.5–5.2)
Alkaline Phosphatase: 74 U/L (ref 39–117)
BUN: 14 mg/dL (ref 6–23)
Calcium: 9.2 mg/dL (ref 8.4–10.5)
Chloride: 103 mEq/L (ref 96–112)
Potassium: 4.6 mEq/L (ref 3.5–5.3)

## 2013-05-02 LAB — CBC WITH DIFFERENTIAL/PLATELET
Basophils Absolute: 0 10*3/uL (ref 0.0–0.1)
Basophils Relative: 0 % (ref 0–1)
HCT: 47.2 % (ref 39.0–52.0)
Lymphocytes Relative: 29 % (ref 12–46)
MCHC: 35.8 g/dL (ref 30.0–36.0)
Neutro Abs: 4.1 10*3/uL (ref 1.7–7.7)
Neutrophils Relative %: 61 % (ref 43–77)
Platelets: 129 10*3/uL — ABNORMAL LOW (ref 150–400)
RDW: 13.9 % (ref 11.5–15.5)
WBC: 6.7 10*3/uL (ref 4.0–10.5)

## 2013-05-02 LAB — HEMOGLOBIN A1C: Hgb A1c MFr Bld: 8.4 % — ABNORMAL HIGH (ref <5.7)

## 2013-05-03 ENCOUNTER — Ambulatory Visit (INDEPENDENT_AMBULATORY_CARE_PROVIDER_SITE_OTHER): Payer: Medicare Other | Admitting: Internal Medicine

## 2013-05-03 ENCOUNTER — Ambulatory Visit (INDEPENDENT_AMBULATORY_CARE_PROVIDER_SITE_OTHER): Payer: Medicare Other | Admitting: Ophthalmology

## 2013-05-03 ENCOUNTER — Encounter: Payer: Self-pay | Admitting: Internal Medicine

## 2013-05-03 VITALS — BP 108/58 | HR 72 | Ht 64.5 in | Wt 201.0 lb

## 2013-05-03 DIAGNOSIS — Z8719 Personal history of other diseases of the digestive system: Secondary | ICD-10-CM

## 2013-05-03 DIAGNOSIS — M48061 Spinal stenosis, lumbar region without neurogenic claudication: Secondary | ICD-10-CM

## 2013-05-03 DIAGNOSIS — D351 Benign neoplasm of parathyroid gland: Secondary | ICD-10-CM

## 2013-05-03 DIAGNOSIS — Z794 Long term (current) use of insulin: Secondary | ICD-10-CM

## 2013-05-03 DIAGNOSIS — G8929 Other chronic pain: Secondary | ICD-10-CM

## 2013-05-03 DIAGNOSIS — I2510 Atherosclerotic heart disease of native coronary artery without angina pectoris: Secondary | ICD-10-CM

## 2013-05-03 DIAGNOSIS — E785 Hyperlipidemia, unspecified: Secondary | ICD-10-CM

## 2013-05-03 DIAGNOSIS — Z86010 Personal history of colonic polyps: Secondary | ICD-10-CM

## 2013-05-03 DIAGNOSIS — Z9889 Other specified postprocedural states: Secondary | ICD-10-CM

## 2013-05-03 DIAGNOSIS — E119 Type 2 diabetes mellitus without complications: Secondary | ICD-10-CM

## 2013-05-03 DIAGNOSIS — M549 Dorsalgia, unspecified: Secondary | ICD-10-CM

## 2013-05-03 DIAGNOSIS — Z0000 Encounter for general adult medical examination without abnormal findings: Secondary | ICD-10-CM

## 2013-05-03 DIAGNOSIS — I10 Essential (primary) hypertension: Secondary | ICD-10-CM

## 2013-05-03 LAB — POCT URINALYSIS DIPSTICK
Ketones, UA: NEGATIVE
Protein, UA: NEGATIVE
Spec Grav, UA: 1.02
Urobilinogen, UA: NEGATIVE
pH, UA: 5.5

## 2013-05-03 NOTE — Progress Notes (Signed)
Subjective:    Patient ID: Joseph Mora, male    DOB: May 08, 1933, 79 y.o.   MRN: [id number]  HPI 79 year old white male with history of insulin-dependent diabetes mellitus, coronary artery disease, hyperlipidemia for health maintenance and evaluation of medical problems. Is be used diagnosed in 1990, history of hyperlipidemia, history of chronic back pain/spinal stenosis with lumbar disc at L3-L4. History of coronary artery disease status post MI with PTCA of 100% occluded circumflex artery September 1990. Had cardiac catheterization with stent placed to right coronary artery September 2000 and. Patient had CMV infection 1997. He initially thought he this was a Pneumovax immunization reaction but CMV was proven.  Patient was diagnosed with H. pylori gastritis in March 1999. He had a screening colonoscopy by Dr. Lynda Rainwater in October 2004. History of adenomatous colon polyps. Right inguinal hernia repair 1953. Lumbar disc surgery L4-L5 and L5-S1 in 1998. Cataract extraction of right eye 2006. Surgery for chronic left ulnar neuropathy due to diabetes and a left carpal tunnel release done by Dr. Teressa Senter in March 2012.  Surgery for left superior parathyroid adenoma September 2011.  He had Pneumovax vaccine in 1997 but we have not read back some they did heal because he believes he had a reaction to the vaccine but I think he had an acute CMV infection.  In 1999 when he was hospitalized for back surgery he subsequently developed a GI bleed requiring emergency surgery with gastroduodenostomy, pyloroplasty, truncal vagotomy. A large bleeding duodenal ulcer was detected in that is what H. pylori was diagnosed.  He quit smoking in 1990. He does chew tobacco. He does not consume alcohol.  Not able to walk as far as she use to. Used to walk a mile daily but not able to do that now due to back pain.  Social history: He is married, has adult children and has raised a number of foster children over the years. He  is a retired Technical sales engineer for the [employer]  Family history: Father died at age 48 struck by a motor vehicle, mother died at age 24 with history of diabetes mellitus, stroke, and Alzheimer's disease. One brother died at age 67 with history of colon cancer, alcoholism and MI. 7 sisters and 7 brothers living. One brother with adult onset diabetes, 3 sisters with diabetes mellitus.   Review of Systems  HENT: Negative.   Eyes: Negative.   Respiratory: Negative.   Cardiovascular: Negative.   Gastrointestinal: Negative.   Endocrine: Negative.   Genitourinary:       Nocturia  Musculoskeletal:       Chronic back pain  Allergic/Immunologic: Negative.   Neurological: Negative.   Hematological: Negative.   Psychiatric/Behavioral: Negative.        Objective:   Physical Exam  Vitals reviewed. Constitutional: He is oriented to person, place, and time. He appears well-developed and well-nourished. No distress.  HENT:  Head: Normocephalic and atraumatic.  Right Ear: External ear normal.  Left Ear: External ear normal.  Mouth/Throat: Oropharynx is clear and moist. No oropharyngeal exudate.  Eyes: Conjunctivae and EOM are normal. Pupils are equal, round, and reactive to light. Right eye exhibits no discharge. Left eye exhibits no discharge. No scleral icterus.  Neck: Neck supple. No JVD present. No thyromegaly present.  Cardiovascular: Normal rate, regular rhythm and normal heart sounds.   No murmur heard. Pulses absent in feet  Pulmonary/Chest: Effort normal and breath sounds normal. No respiratory distress. He has no wheezes. He has no rales. He exhibits  no tenderness.  Abdominal: Soft. Bowel sounds are normal. He exhibits no distension and no mass. There is no tenderness. There is no rebound and no guarding.  Genitourinary: Prostate normal.  Prostate enlarged without nodules  Musculoskeletal: He exhibits no edema.  Diabetic foot exam pulses are absent. Has onychomycosis.    Lymphadenopathy:    He has no cervical adenopathy.  Neurological: He is alert and oriented to person, place, and time. He has normal reflexes. He displays normal reflexes. No cranial nerve deficit.  Skin: Skin is warm and dry. No rash noted. He is not diaphoretic. No erythema.  Psychiatric: He has a normal mood and affect. His behavior is normal. Judgment and thought content normal.          Assessment & Plan:   Insulin dependent diabetes mellitus-stable with fairly good control   Coronary disease-followed by Cardiology  Hyperlipidemia  Chronic low back pain  HTN-stable  Adenomatous colon polyps-colonoscopy done 2004. Repeat colonoscopy due.   BPH -improved with Flomax  History of H. pylori infection with large gastric ulcer which required surgery to to control acute bleed  History of hyperparathyroidism status post removal of adenoma September 2011  Plan: Return in 6 months and continue same medications. Recommend annual flu vaccine and Zostavax vaccine.                                 Subjective:   Patient presents for Medicare Annual/Subsequent preventive examination.   Review Past Medical/Family/Social:see above   Risk Factors  Current exercise habits: about a block and a half one way due to leg and back issues Dietary issues discussed: low fat low carb  Cardiac risk factors:DM, Hyperlipidemia, HTN, CAD  Depression Screen  (Note: if answer to either of the following is "Yes", a more complete depression screening is indicated)   Over the past two weeks, have you felt down, depressed or hopeless? No  Over the past two weeks, have you felt little interest or pleasure in doing things? No Have you lost interest or pleasure in daily life? No Do you often feel hopeless? No Do you cry easily over simple problems? No   Activities of Daily Living  In your present state of health, do you have any difficulty performing the following  activities?:   Driving? No  Managing money? No  Feeding yourself? No  Getting from bed to chair? No  Climbing a flight of stairs? No  Preparing food and eating?: No  Bathing or showering? No  Getting dressed: No  Getting to the toilet? No  Using the toilet:No  Moving around from place to place: No  In the past year have you fallen or had a near fall?:No  Are you sexually active? No  Do you have more than one partner? No   Hearing Difficulties: No  Do you often ask people to speak up or repeat themselves? No  Do you experience ringing or noises in your ears? No  Do you have difficulty understanding soft or whispered voices? No  Do you feel that you have a problem with memory? No Do you often misplace items? No    Home Safety:  Do you have a smoke alarm at your residence? Yes Do you have grab bars in the bathroom?no Do you have throw rugs in your house?n/a   Cognitive Testing  Alert? Yes Normal Appearance?Yes  Oriented to person? Yes Place? Yes  Time? Yes  Recall of three objects? Yes  Can perform simple calculations? Yes  Displays appropriate judgment?Yes  Can read the correct time from a watch face?Yes   List the Names of Other Physician/Practitioners you currently use:  See referral list for the physicians patient is currently seeing.  Cardoliologist   Review of Systems: see above   Objective:     General appearance: Appears stated age Head: Normocephalic, without obvious abnormality, atraumatic  Eyes: conj clear, EOMi PEERLA  Ears: normal TM's and external ear canals both ears  Nose: Nares normal. Septum midline. Mucosa normal. No drainage or sinus tenderness.  Throat: lips, mucosa, and tongue normal; teeth and gums normal  Neck: no adenopathy, no carotid bruit, no JVD, supple, symmetrical, trachea midline and thyroid not enlarged, symmetric, no tenderness/mass/nodules  No CVA tenderness.  Lungs: clear to auscultation bilaterally  Breasts: normal  appearance, no masses or tenderness Heart: regular rate and rhythm, S1, S2 normal, no murmur, click, rub or gallop  Abdomen: soft, non-tender; bowel sounds normal; no masses, no organomegaly  Musculoskeletal: ROM normal in all joints, no crepitus, no deformity, Normal muscle strengthen. Back  is symmetric, no curvature. Skin: Skin color, texture, turgor normal. No rashes or lesions  Lymph nodes: Cervical, supraclavicular, and axillary nodes normal.  Neurologic: CN 2 -12 Normal, Normal symmetric reflexes. Normal coordination and gait  Psych: Alert & Oriented x 3, Mood appear stable.    Assessment:    Annual wellness medicare exam   Plan:    During the course of the visit the patient was educated and counseled about appropriate screening and preventive services including:        Patient Instructions (the written plan) was given to the patient.  Medicare Attestation  I have personally reviewed:  The patient's medical and social history  Their use of alcohol, tobacco or illicit drugs  Their current medications and supplements  The patient's functional ability including ADLs,fall risks, home safety risks, cognitive, and hearing and visual impairment  Diet and physical activities  Evidence for depression or mood disorders  The patient's weight, height, BMI, and visual acuity have been recorded in the chart. I have made referrals, counseling, and provided education to the patient based on review of the above and I have provided the patient with a written personalized care plan for preventive services.

## 2013-05-06 ENCOUNTER — Ambulatory Visit (INDEPENDENT_AMBULATORY_CARE_PROVIDER_SITE_OTHER): Payer: Medicare Other | Admitting: Ophthalmology

## 2013-05-06 DIAGNOSIS — I10 Essential (primary) hypertension: Secondary | ICD-10-CM

## 2013-05-06 DIAGNOSIS — E1139 Type 2 diabetes mellitus with other diabetic ophthalmic complication: Secondary | ICD-10-CM

## 2013-05-06 DIAGNOSIS — E11359 Type 2 diabetes mellitus with proliferative diabetic retinopathy without macular edema: Secondary | ICD-10-CM

## 2013-05-06 DIAGNOSIS — H35039 Hypertensive retinopathy, unspecified eye: Secondary | ICD-10-CM

## 2013-06-16 ENCOUNTER — Other Ambulatory Visit: Payer: Self-pay | Admitting: Internal Medicine

## 2013-06-16 ENCOUNTER — Other Ambulatory Visit: Payer: Self-pay

## 2013-06-16 MED ORDER — INSULIN NPH (HUMAN) (ISOPHANE) 100 UNIT/ML ~~LOC~~ SUSP: 60.0000 [IU] | Freq: Every day | SUBCUTANEOUS | Status: DC

## 2013-06-24 ENCOUNTER — Telehealth: Payer: Self-pay | Admitting: Internal Medicine

## 2013-08-05 ENCOUNTER — Encounter: Payer: Self-pay | Admitting: Internal Medicine

## 2013-08-05 NOTE — Patient Instructions (Signed)
Continue same medications and return in 6 months. Watch diet. 

## 2013-09-23 ENCOUNTER — Other Ambulatory Visit: Payer: Self-pay | Admitting: Internal Medicine

## 2013-09-28 ENCOUNTER — Other Ambulatory Visit: Payer: Self-pay

## 2013-09-28 MED ORDER — METOPROLOL TARTRATE 50 MG PO TABS: 50.0000 mg | ORAL_TABLET | Freq: Two times a day (BID) | ORAL | Status: DC

## 2013-11-02 ENCOUNTER — Other Ambulatory Visit: Payer: Self-pay | Admitting: Internal Medicine

## 2013-11-08 ENCOUNTER — Other Ambulatory Visit: Payer: Medicare Other | Admitting: Internal Medicine

## 2013-11-09 ENCOUNTER — Ambulatory Visit (INDEPENDENT_AMBULATORY_CARE_PROVIDER_SITE_OTHER): Payer: Medicare Other | Admitting: Ophthalmology

## 2013-11-09 DIAGNOSIS — E1065 Type 1 diabetes mellitus with hyperglycemia: Secondary | ICD-10-CM

## 2013-11-09 DIAGNOSIS — H35039 Hypertensive retinopathy, unspecified eye: Secondary | ICD-10-CM

## 2013-11-09 DIAGNOSIS — I10 Essential (primary) hypertension: Secondary | ICD-10-CM

## 2013-11-09 DIAGNOSIS — E1039 Type 1 diabetes mellitus with other diabetic ophthalmic complication: Secondary | ICD-10-CM

## 2013-11-09 DIAGNOSIS — E11359 Type 2 diabetes mellitus with proliferative diabetic retinopathy without macular edema: Secondary | ICD-10-CM

## 2013-11-10 ENCOUNTER — Ambulatory Visit (INDEPENDENT_AMBULATORY_CARE_PROVIDER_SITE_OTHER): Payer: Medicare Other | Admitting: Internal Medicine

## 2013-11-10 ENCOUNTER — Encounter: Payer: Self-pay | Admitting: Internal Medicine

## 2013-11-10 VITALS — BP 140/76 | HR 76 | Temp 97.4°F | Ht 64.25 in | Wt 199.0 lb

## 2013-11-10 DIAGNOSIS — E785 Hyperlipidemia, unspecified: Secondary | ICD-10-CM

## 2013-11-10 DIAGNOSIS — Z79899 Other long term (current) drug therapy: Secondary | ICD-10-CM

## 2013-11-10 DIAGNOSIS — E119 Type 2 diabetes mellitus without complications: Secondary | ICD-10-CM

## 2013-11-10 LAB — HEPATIC FUNCTION PANEL
ALT: 14 U/L (ref 0–53)
AST: 19 U/L (ref 0–37)
Albumin: 3.7 g/dL (ref 3.5–5.2)
Alkaline Phosphatase: 69 U/L (ref 39–117)
BILIRUBIN INDIRECT: 0.6 mg/dL (ref 0.2–1.2)
BILIRUBIN TOTAL: 0.8 mg/dL (ref 0.2–1.2)
Bilirubin, Direct: 0.2 mg/dL (ref 0.0–0.3)
Total Protein: 6.2 g/dL (ref 6.0–8.3)

## 2013-11-10 LAB — LIPID PANEL
CHOL/HDL RATIO: 1.8 ratio
Cholesterol: 138 mg/dL (ref 0–200)
HDL: 77 mg/dL (ref >39)
LDL CALC: 46 mg/dL (ref 0–99)
Triglycerides: 77 mg/dL (ref <150)
VLDL: 15 mg/dL (ref 0–40)

## 2013-11-10 LAB — HEMOGLOBIN A1C
HEMOGLOBIN A1C: 8.1 % — AB (ref <5.7)
MEAN PLASMA GLUCOSE: 186 mg/dL — AB (ref <117)
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
## 2013-11-10 NOTE — Patient Instructions (Signed)
Continue same medications and return in 6 months 

## 2013-12-14 ENCOUNTER — Other Ambulatory Visit: Payer: Self-pay | Admitting: Internal Medicine

## 2013-12-29 ENCOUNTER — Encounter: Payer: Self-pay | Admitting: Cardiology

## 2014-02-09 ENCOUNTER — Other Ambulatory Visit: Payer: Self-pay | Admitting: Internal Medicine

## 2014-02-10 ENCOUNTER — Other Ambulatory Visit: Payer: Self-pay

## 2014-03-30 NOTE — Progress Notes (Signed)
History of Present Illness: 80 yo WM with history of CAD, DM, HLD, DJD here today for cardiac follow up. He has been followed in the past by Dr. Doreatha Lew. His cardiac history dates back to 77 when he had a stent placed in the Circumflex. He had a stent placed in the RCA in 2000. LVEF has been normal. Most recent stress test in July 2014 with normal LV function, no obvious ischemia.   He is here today for follow up. He tells me that he has chronic hip pain which limits his walking. No chest pain or SOB.   Primary Care Physician: Priscella Mann Baxley  Last Lipid Profile:Lipid Panel     Component Value Date/Time   CHOL 138 11/10/2013 0907   TRIG 77 11/10/2013 0907   HDL 77 11/10/2013 0907   CHOLHDL 1.8 11/10/2013 0907   VLDL 15 11/10/2013 0907   LDLCALC 46 11/10/2013 1027     Past Medical History  Diagnosis Date  . CAD (coronary artery disease)   . Hyperlipidemia   . Diabetes mellitus     Past Surgical History  Procedure Laterality Date  . Carpal tunnel release    . Parathyroidectomy    . Eye surgery    . Inguinal hernia repair      Current Outpatient Prescriptions  Medication Sig Dispense Refill  . aspirin 81 MG tablet Take 1 tablet (81 mg total) by mouth daily.  30 tablet  6  . atorvastatin (LIPITOR) 40 MG tablet TAKE 1 TABLET BY MOUTH DAILY  30 tablet  11  . dimenhyDRINATE (DRAMAMINE) 50 MG tablet Take 50 mg by mouth every 8 (eight) hours as needed.      . doxazosin (CARDURA) 2 MG tablet TAKE 1 TABLET BY MOUTH AT BEDTIME  30 tablet  6  . ibuprofen (ADVIL,MOTRIN) 200 MG tablet Take 200 mg by mouth every 6 (six) hours as needed.      . insulin NPH (HUMULIN N) 100 UNIT/ML injection Inject 60 Units into the skin daily before breakfast. 30 units before dinner  30 mL  11  . Insulin Syringe-Needle U-100 (INSULIN SYRINGE 1CC/30GX5/16") 30G X 5/16" 1 ML MISC USE TWICE DAILY OR AS DIRECTED  100 each  7  . Insulin Syringe-Needle U-100 (INSULIN SYRINGE 1CC/30GX5/16") 30G X 5/16" 1 ML MISC  USE TWICE DAILY OR AS DIRECTED  100 each  prn  . Insulin Syringes, Disposable, U-100 1 ML MISC       . lisinopril (PRINIVIL,ZESTRIL) 20 MG tablet TAKE 1 TABLET BY MOUTH DAILY  30 tablet  11  . metoprolol (LOPRESSOR) 50 MG tablet Take 1 tablet (50 mg total) by mouth 2 (two) times daily.  60 tablet  11  . tamsulosin (FLOMAX) 0.4 MG CAPS capsule TAKE 1 CAPSULE BY MOUTH DAILY  30 capsule  6   No current facility-administered medications for this visit.    Allergies  Allergen Reactions  . Morphine And Related Itching  . Penicillins Hives    History   Social History  . Marital Status: Married    Spouse Name: N/A    Number of Children: N/A  . Years of Education: N/A   Occupational History  . Not on file.   Social History Main Topics  . Smoking status: Former Smoker    Quit date: 08/03/2010  . Smokeless tobacco: Current User    Types: Chew  . Alcohol Use: No  . Drug Use: No  . Sexual Activity: Not on file  Other Topics Concern  . Not on file   Social History Narrative  . No narrative on file    Family History  Problem Relation Age of Onset  . Heart attack Paternal Aunt     Review of Systems:  As stated in the HPI and otherwise negative.   BP 100/60  Pulse 80  Ht 5\' 4"  (1.626 m)  Wt 187 lb (84.823 kg)  BMI 32.08 kg/m2  Physical Examination: General: Well developed, well nourished, NAD HEENT: OP clear, mucus membranes moist SKIN: warm, dry. No rashes. Neuro: No focal deficits Musculoskeletal: Muscle strength 5/5 all ext Psychiatric: Mood and affect normal Neck: No JVD, no carotid bruits, no thyromegaly, no lymphadenopathy. Lungs:Clear bilaterally, no wheezes, rhonci, crackles Cardiovascular: Regular rate and rhythm. No murmurs, gallops or rubs. Abdomen:Soft. Bowel sounds present. Non-tender.  Extremities: No lower extremity edema. Pulses are 2 + in the bilateral DP/PT.  EKG: NSR, rate 75 bpm.   Assessment and Plan:   1. CAD:  Stable. Will continue  current therapy.  2. HLD: lipids well controlled on statin.   3. Tobacco abuse, in remission: He stopped smoking 2010.

## 2014-03-31 ENCOUNTER — Ambulatory Visit (INDEPENDENT_AMBULATORY_CARE_PROVIDER_SITE_OTHER): Payer: Medicare Other | Admitting: Cardiovascular Disease

## 2014-03-31 ENCOUNTER — Encounter: Payer: Self-pay | Admitting: Cardiovascular Disease

## 2014-03-31 VITALS — BP 100/60 | HR 80 | Ht 64.0 in | Wt 187.0 lb

## 2014-03-31 DIAGNOSIS — E785 Hyperlipidemia, unspecified: Secondary | ICD-10-CM

## 2014-03-31 DIAGNOSIS — I2510 Atherosclerotic heart disease of native coronary artery without angina pectoris: Secondary | ICD-10-CM

## 2014-03-31 DIAGNOSIS — F17201 Nicotine dependence, unspecified, in remission: Secondary | ICD-10-CM

## 2014-03-31 DIAGNOSIS — Z87891 Personal history of nicotine dependence: Secondary | ICD-10-CM

## 2014-03-31 MED ORDER — NITROGLYCERIN 0.4 MG SL SUBL: 0.4000 mg | SUBLINGUAL_TABLET | SUBLINGUAL | Status: DC | PRN

## 2014-03-31 NOTE — Patient Instructions (Signed)
Your physician wants you to follow-up in:  12 months.  You will receive a reminder letter in the mail two months in advance. If you don't receive a letter, please call our office to schedule the follow-up appointment.   

## 2014-04-05 DIAGNOSIS — Z0289 Encounter for other administrative examinations: Secondary | ICD-10-CM

## 2014-04-25 ENCOUNTER — Encounter: Payer: Self-pay | Admitting: Cardiology

## 2014-05-09 ENCOUNTER — Other Ambulatory Visit: Payer: Medicare Other | Admitting: Internal Medicine

## 2014-05-09 DIAGNOSIS — E785 Hyperlipidemia, unspecified: Secondary | ICD-10-CM

## 2014-05-09 DIAGNOSIS — Z130 Encounter for screening for diseases of the blood and blood-forming organs and certain disorders involving the immune mechanism: Secondary | ICD-10-CM

## 2014-05-09 DIAGNOSIS — Z125 Encounter for screening for malignant neoplasm of prostate: Secondary | ICD-10-CM

## 2014-05-09 DIAGNOSIS — E119 Type 2 diabetes mellitus without complications: Secondary | ICD-10-CM

## 2014-05-09 LAB — CBC WITH DIFFERENTIAL/PLATELET
Basophils Absolute: 0 10*3/uL (ref 0.0–0.1)
Basophils Relative: 0 % (ref 0–1)
EOS ABS: 0.2 10*3/uL (ref 0.0–0.7)
Eosinophils Relative: 3 % (ref 0–5)
HCT: 47 % (ref 39.0–52.0)
HEMOGLOBIN: 16.5 g/dL (ref 13.0–17.0)
LYMPHS ABS: 1.7 10*3/uL (ref 0.7–4.0)
LYMPHS PCT: 26 % (ref 12–46)
MCH: 31.7 pg (ref 26.0–34.0)
MCHC: 35.1 g/dL (ref 30.0–36.0)
MCV: 90.2 fL (ref 78.0–100.0)
MONOS PCT: 8 % (ref 3–12)
Monocytes Absolute: 0.5 10*3/uL (ref 0.1–1.0)
NEUTROS PCT: 63 % (ref 43–77)
Neutro Abs: 4.1 10*3/uL (ref 1.7–7.7)
Platelets: 125 10*3/uL — ABNORMAL LOW (ref 150–400)
RBC: 5.21 MIL/uL (ref 4.22–5.81)
RDW: 14.5 % (ref 11.5–15.5)
WBC: 6.5 10*3/uL (ref 4.0–10.5)

## 2014-05-09 LAB — COMPREHENSIVE METABOLIC PANEL
ALT: 15 U/L (ref 0–53)
AST: 16 U/L (ref 0–37)
Albumin: 3.8 g/dL (ref 3.5–5.2)
Alkaline Phosphatase: 69 U/L (ref 39–117)
BILIRUBIN TOTAL: 0.6 mg/dL (ref 0.2–1.2)
BUN: 9 mg/dL (ref 6–23)
CO2: 30 meq/L (ref 19–32)
Calcium: 8.9 mg/dL (ref 8.4–10.5)
Chloride: 101 mEq/L (ref 96–112)
Creat: 0.98 mg/dL (ref 0.50–1.35)
GLUCOSE: 95 mg/dL (ref 70–99)
Potassium: 4 mEq/L (ref 3.5–5.3)
Sodium: 139 mEq/L (ref 135–145)
TOTAL PROTEIN: 6.4 g/dL (ref 6.0–8.3)

## 2014-05-09 LAB — LIPID PANEL
CHOLESTEROL: 137 mg/dL (ref 0–200)
HDL: 87 mg/dL (ref >39)
LDL Cholesterol: 36 mg/dL (ref 0–99)
TRIGLYCERIDES: 71 mg/dL (ref <150)
Total CHOL/HDL Ratio: 1.6 Ratio
VLDL: 14 mg/dL (ref 0–40)

## 2014-05-09 LAB — HEMOGLOBIN A1C
Hgb A1c MFr Bld: 8.2 % — ABNORMAL HIGH (ref <5.7)
Mean Plasma Glucose: 189 mg/dL — ABNORMAL HIGH (ref <117)

## 2014-05-10 ENCOUNTER — Ambulatory Visit (INDEPENDENT_AMBULATORY_CARE_PROVIDER_SITE_OTHER): Payer: Medicare Other | Admitting: Ophthalmology

## 2014-05-10 DIAGNOSIS — H35039 Hypertensive retinopathy, unspecified eye: Secondary | ICD-10-CM

## 2014-05-10 DIAGNOSIS — E11359 Type 2 diabetes mellitus with proliferative diabetic retinopathy without macular edema: Secondary | ICD-10-CM | POA: Diagnosis not present

## 2014-05-10 DIAGNOSIS — E1165 Type 2 diabetes mellitus with hyperglycemia: Secondary | ICD-10-CM

## 2014-05-10 DIAGNOSIS — E1139 Type 2 diabetes mellitus with other diabetic ophthalmic complication: Secondary | ICD-10-CM

## 2014-05-10 DIAGNOSIS — H43819 Vitreous degeneration, unspecified eye: Secondary | ICD-10-CM

## 2014-05-10 DIAGNOSIS — I10 Essential (primary) hypertension: Secondary | ICD-10-CM

## 2014-05-10 LAB — PSA, MEDICARE: PSA: 1.23 ng/mL (ref <=4.00)

## 2014-05-11 ENCOUNTER — Ambulatory Visit (INDEPENDENT_AMBULATORY_CARE_PROVIDER_SITE_OTHER): Payer: Medicare Other | Admitting: Internal Medicine

## 2014-05-11 ENCOUNTER — Encounter: Payer: Self-pay | Admitting: Internal Medicine

## 2014-05-11 VITALS — BP 128/70 | HR 78 | Temp 97.7°F | Ht 64.0 in | Wt 197.0 lb

## 2014-05-11 DIAGNOSIS — IMO0001 Reserved for inherently not codable concepts without codable children: Secondary | ICD-10-CM

## 2014-05-11 DIAGNOSIS — M545 Low back pain, unspecified: Secondary | ICD-10-CM

## 2014-05-11 DIAGNOSIS — Z8679 Personal history of other diseases of the circulatory system: Secondary | ICD-10-CM

## 2014-05-11 DIAGNOSIS — I10 Essential (primary) hypertension: Secondary | ICD-10-CM

## 2014-05-11 DIAGNOSIS — E119 Type 2 diabetes mellitus without complications: Secondary | ICD-10-CM

## 2014-05-11 DIAGNOSIS — N400 Enlarged prostate without lower urinary tract symptoms: Secondary | ICD-10-CM

## 2014-05-11 DIAGNOSIS — E785 Hyperlipidemia, unspecified: Secondary | ICD-10-CM

## 2014-05-11 DIAGNOSIS — Z794 Long term (current) use of insulin: Secondary | ICD-10-CM

## 2014-05-11 DIAGNOSIS — Z0000 Encounter for general adult medical examination without abnormal findings: Secondary | ICD-10-CM

## 2014-05-11 LAB — POCT URINALYSIS DIPSTICK
BILIRUBIN UA: NEGATIVE
Blood, UA: NEGATIVE
Glucose, UA: NEGATIVE
KETONES UA: NEGATIVE
Leukocytes, UA: NEGATIVE
NITRITE UA: NEGATIVE
Protein, UA: NEGATIVE
Spec Grav, UA: <=1.005
Urobilinogen, UA: NEGATIVE
pH, UA: 7.5

## 2014-05-11 NOTE — Patient Instructions (Signed)
Continue same meds and return in 6 months 

## 2014-05-12 LAB — MICROALBUMIN, URINE: MICROALB UR: 0.79 mg/dL (ref 0.00–1.89)

## 2014-05-15 ENCOUNTER — Other Ambulatory Visit: Payer: Self-pay | Admitting: Internal Medicine

## 2014-06-15 ENCOUNTER — Telehealth: Payer: Self-pay | Admitting: Internal Medicine

## 2014-06-15 NOTE — Telephone Encounter (Signed)
I am not aware of generic insulin.

## 2014-06-15 NOTE — Telephone Encounter (Signed)
Pt would like Generic Insulin prescribed for cost savings.  Pharmacy of choice [facility] / [place].  Best phone # for pt is [phone]  (3 vials are $150.00)

## 2014-06-16 NOTE — Telephone Encounter (Signed)
Patient informed there is no generic insulin.  Advised him to contact his insurance or pharmacy about the fluctuating cost.

## 2014-06-19 NOTE — Progress Notes (Signed)
Subjective:    Patient ID: Joseph Mora, male    DOB: 06-24-1933, 80 y.o.   MRN: [id number]  HPI 80 year old White Male in today for health maintenance exam and evaluation of medical issues. History of coronary artery disease, hyperlipidemia, BPH, chronic back pain, insulin-dependent diabetes mellitus, history of adenomatous colon polyps, hypertension.  In September 1990, he had PTCA with of a 100% occluded circumflex artery. He had cardiac catheterization with stent placement to right coronary artery September 2000. Last stress test was in 2011 and was negative.  History of CMV infection 1997 which he initially thought was a reaction to Pneumovax immunization.  History of H. pylori gastritis March 1999.  Had screening colonoscopy by Dr. Collene Mares in October 2004. History of adenomatous colon polyps.  Right inguinal hernia repair 1953. Lumbar disc surgery L4-L5 and L5-S1 1998. Cataract extraction of right eye in 2006. Surgery for chronic left ulnar neuropathy due to diabetes and a left carpal tunnel release done by Dr. Wess Botts in March 2012.  Surgery for left superior parathyroid adenoma September 2011  In 1999 when he was hospitalized for back surgery he developed a GI bleed requiring emergency surgery with gastroduodenostomy and pyloroplasty. He also had a truncal vagotomy. A large bleeding duodenal ulcer was detected and that is when H. pylori was diagnosed.  He quit smoking in 1990. He does not consume alcohol.  Tries to walk daily but not able to walk as far as she use to.  Social history: He is married. He has adult children and has raised a number of foster children over the years. He is a retired Associate Professor for [employer].  Family history: Father died at age 17 struck by a motor vehicle. Mother died at age 11 with history of diabetes mellitus, stroke and Alzheimer's disease. One brother died at age 2 with history of colon cancer alcoholism an MI. 7 sisters and 7  brothers living. One brother with adult-onset diabetes. 3 sisters with diabetes mellitus.    Review of Systems  Constitutional: Positive for fatigue.  Respiratory: Negative.   Cardiovascular:       History of coronary disease  Gastrointestinal: Negative.   Genitourinary:       BPH  Musculoskeletal:       Chronic back pain       Objective:   Physical Exam  Vitals reviewed. Constitutional: He is oriented to person, place, and time. He appears well-developed and well-nourished.  HENT:  Head: Normocephalic and atraumatic.  Right Ear: External ear normal.  Left Ear: External ear normal.  Mouth/Throat: Oropharynx is clear and moist.  Eyes: Conjunctivae and EOM are normal. Pupils are equal, round, and reactive to light.  Neck: Neck supple. No thyromegaly present.  Cardiovascular: Normal rate, regular rhythm and normal heart sounds.   No murmur heard. Pulmonary/Chest: Effort normal and breath sounds normal. No respiratory distress. He has no wheezes. He has no rales.  Abdominal: Soft. He exhibits no distension and no mass. There is no tenderness. There is no rebound.  Genitourinary: Prostate normal.  Musculoskeletal: He exhibits no edema.  Neurological: He is alert and oriented to person, place, and time. He has normal reflexes. No cranial nerve deficit.  Skin: Skin is warm and dry. No rash noted.  Psychiatric: He has a normal mood and affect. His behavior is normal. Judgment and thought content normal.          Assessment & Plan:  Insulin-dependent diabetes mellitus. Hemoglobin A1c stable for many  years her graft hypertension-stable  History of coronary artery disease-no recurrent chest pain  History of lumbar spinal stenosis and chronic low back pain  History of adenomatous colon polyps  History of hyperlipidemia  History of vagotomy and pyloroplasty due to gastric ulcer  History of BPH  Plan: Continue same medications and return in 6 months

## 2014-07-03 ENCOUNTER — Encounter (HOSPITAL_COMMUNITY): Payer: Self-pay | Admitting: Emergency Medicine

## 2014-07-03 ENCOUNTER — Emergency Department (HOSPITAL_COMMUNITY)
Admission: EM | Admit: 2014-07-03 | Discharge: 2014-07-03 | Disposition: A | Discharge status: Home / Self Care | Payer: Medicare Other | Attending: Emergency Medicine | Admitting: Emergency Medicine

## 2014-07-03 DIAGNOSIS — Z87891 Personal history of nicotine dependence: Secondary | ICD-10-CM | POA: Insufficient documentation

## 2014-07-03 DIAGNOSIS — I10 Essential (primary) hypertension: Secondary | ICD-10-CM | POA: Insufficient documentation

## 2014-07-03 DIAGNOSIS — Y9289 Other specified places as the place of occurrence of the external cause: Secondary | ICD-10-CM | POA: Diagnosis not present

## 2014-07-03 DIAGNOSIS — Y9389 Activity, other specified: Secondary | ICD-10-CM | POA: Diagnosis not present

## 2014-07-03 DIAGNOSIS — Z9889 Other specified postprocedural states: Secondary | ICD-10-CM | POA: Insufficient documentation

## 2014-07-03 DIAGNOSIS — S0502XA Injury of conjunctiva and corneal abrasion without foreign body, left eye, initial encounter: Secondary | ICD-10-CM | POA: Diagnosis not present

## 2014-07-03 DIAGNOSIS — I2510 Atherosclerotic heart disease of native coronary artery without angina pectoris: Secondary | ICD-10-CM | POA: Diagnosis not present

## 2014-07-03 DIAGNOSIS — Z880 Allergy status to penicillin: Secondary | ICD-10-CM | POA: Insufficient documentation

## 2014-07-03 DIAGNOSIS — Z7982 Long term (current) use of aspirin: Secondary | ICD-10-CM | POA: Diagnosis not present

## 2014-07-03 DIAGNOSIS — X58XXXA Exposure to other specified factors, initial encounter: Secondary | ICD-10-CM | POA: Diagnosis not present

## 2014-07-03 DIAGNOSIS — Z794 Long term (current) use of insulin: Secondary | ICD-10-CM | POA: Insufficient documentation

## 2014-07-03 DIAGNOSIS — H579 Unspecified disorder of eye and adnexa: Secondary | ICD-10-CM | POA: Diagnosis present

## 2014-07-03 DIAGNOSIS — Z79899 Other long term (current) drug therapy: Secondary | ICD-10-CM | POA: Insufficient documentation

## 2014-07-03 DIAGNOSIS — E119 Type 2 diabetes mellitus without complications: Secondary | ICD-10-CM | POA: Insufficient documentation

## 2014-07-03 MED ORDER — HYDROCODONE-ACETAMINOPHEN 5-325 MG PO TABS: 1.0000 | ORAL_TABLET | Freq: Four times a day (QID) | ORAL | Status: DC | PRN

## 2014-07-03 MED ORDER — TOBRAMYCIN 0.3 % OP SOLN: 2.0000 [drp] | OPHTHALMIC | Status: AC

## 2014-07-03 MED ORDER — TETRACAINE HCL 0.5 % OP SOLN
1.0000 [drp] | Freq: Once | OPHTHALMIC | Status: AC
  Administered 2014-07-03: 1 [drp] via OPHTHALMIC
  Filled 2014-07-03: qty 2

## 2014-07-03 MED ORDER — FLUORESCEIN SODIUM 1 MG OP STRP
1.0000 | ORAL_STRIP | Freq: Once | OPHTHALMIC | Status: AC
  Administered 2014-07-03: 1 via OPHTHALMIC
  Filled 2014-07-03: qty 1

## 2014-07-03 NOTE — ED Provider Notes (Signed)
CSN: [id number]     Arrival date & time 07/03/14  1711 History  This chart was scribed for non-physician practitioner, Starlyn Skeans, PA-C working with Tanna Furry, MD by Frederich Balding, ED scribe. This patient was seen in room TR04C/TR04C and the patient's care was started at 6:29 PM.    Chief Complaint  Patient presents with  . Eye Drainage   The history is provided by the patient. No language interpreter was used.   HPI Comments: Joseph Mora is a 80 y.o. male with history of diabetes who presents to the Emergency Department complaining of left eye pain, redness and clear discharge that started yesterday. Reports that the redness started before the pain. There is pain with eye movement. States he tried calling his eye doctor but was told to come to the ED. Holding his eye open relieves pain but blinking and closing his eye worsens the pain. Pt wears reading glasses but no contacts. Denies swelling around eye, right eye pain, halo in vision, blurred vision, other visual changes, photophobia, nausea, emesis. Pt's sugar was 124 this morning. Reports history of having cataracts removed. Denies history of glaucoma or retinal problems.   Eye doctor is Dr. Zigmund Daniel   Past Medical History  Diagnosis Date  . CAD (coronary artery disease)   . Hyperlipidemia   . Diabetes mellitus    Past Surgical History  Procedure Laterality Date  . Carpal tunnel release    . Parathyroidectomy    . Eye surgery    . Inguinal hernia repair     Family History  Problem Relation Age of Onset  . Heart attack Paternal Aunt   . Stroke Mother    History  Substance Use Topics  . Smoking status: Former Smoker    Quit date: 08/03/2010  . Smokeless tobacco: Current User    Types: Chew  . Alcohol Use: No    Review of Systems  HENT: Negative for facial swelling.   Eyes: Positive for pain, discharge and redness. Negative for photophobia and visual disturbance.  Gastrointestinal: Negative for nausea and  vomiting.  All other systems reviewed and are negative.  Allergies  Morphine and related and Penicillins  Home Medications   Prior to Admission medications   Medication Sig Start Date End Date Taking? Authorizing Provider  aspirin 81 MG tablet Take 81 mg by mouth daily. 10/01/11  Yes Burnell Blanks, MD  atorvastatin (LIPITOR) 40 MG tablet Take 40 mg by mouth daily.   Yes Historical Provider, MD  doxazosin (CARDURA) 2 MG tablet Take 2 mg by mouth daily.   Yes Historical Provider, MD  insulin NPH Human (HUMULIN N,NOVOLIN N) 100 UNIT/ML injection Inject 62 Units into the skin daily before breakfast. 32 units before dinner 06/16/13  Yes Elby Showers, MD  lisinopril (PRINIVIL,ZESTRIL) 20 MG tablet Take 20 mg by mouth daily.   Yes Historical Provider, MD  metoprolol (LOPRESSOR) 50 MG tablet Take 50 mg by mouth 2 (two) times daily. 09/28/13  Yes Elby Showers, MD  nitroGLYCERIN (NITROSTAT) 0.4 MG SL tablet Place 1 tablet (0.4 mg total) under the tongue every 5 (five) minutes as needed for chest pain. 03/31/14  Yes Burnell Blanks, MD  tamsulosin (FLOMAX) 0.4 MG CAPS capsule Take 0.4 mg by mouth daily.   Yes Historical Provider, MD  HYDROcodone-acetaminophen (NORCO/VICODIN) 5-325 MG per tablet Take 1 tablet by mouth every 6 (six) hours as needed for moderate pain or severe pain. 07/03/14   Jamie Kato Forcucci, PA-C  Insulin Syringe-Needle U-100 (INSULIN SYRINGE 1CC/30GX5/16") 30G X 5/16" 1 ML MISC USE TWICE DAILY OR AS DIRECTED 03/16/13   Elby Showers, MD  tobramycin (TOBREX) 0.3 % ophthalmic solution Place 2 drops into the left eye every 4 (four) hours. 07/03/14 07/08/14  Hiroyuki Ozanich A Forcucci, PA-C   BP 148/84  Pulse 94  Temp(Src) 98.7 F (37.1 C)  Resp 18  Wt 199 lb 6 oz (90.436 kg)  SpO2 95%  Physical Exam  Nursing note and vitals reviewed. Constitutional: He is oriented to person, place, and time. He appears well-developed and well-nourished. No distress.  HENT:  Head:  Normocephalic and atraumatic.  Eyes: EOM are normal. Pupils are equal, round, and reactive to light. Left conjunctiva is injected.  Fundoscopic exam:      The right eye shows red reflex.       The left eye shows red reflex.  Visual Acuity - Bilateral Near: 20/60 ; R Near: 20/60 ; L Near: 20/400.  Increased uptake of the left eye at 3 oclock with fluoroscein dye  Neck: Neck supple. No tracheal deviation present.  Cardiovascular: Normal rate, regular rhythm and normal heart sounds.  Exam reveals no gallop and no friction rub.   No murmur heard. Pulmonary/Chest: Effort normal and breath sounds normal. No respiratory distress. He has no wheezes. He has no rhonchi. He has no rales.  Musculoskeletal: Normal range of motion.  Neurological: He is alert and oriented to person, place, and time.  Skin: Skin is warm and dry.  Psychiatric: He has a normal mood and affect. His behavior is normal. Judgment and thought content normal.    ED Course  Procedures (including critical care time)  DIAGNOSTIC STUDIES: Oxygen Saturation is 95% on RA, adequate by my interpretation.    COORDINATION OF CARE: 6:36 PM-Discussed treatment plan which includes checking for corneal abrasions with pt at bedside and pt agreed to plan.   Labs Review Labs Reviewed - No data to display  Imaging Review No results found.   EKG Interpretation None      MDM   Final diagnoses:  Left corneal abrasion, initial encounter   Patient is a 80 y.o. Male with left eye pain.  Physical exam reveals left conjunctival injection.  There is a corneal abrasion with fluoroscein dye which was confirmed by Dr. Jeneen Rinks on slit lamp examination.  Vision is normal.  Will place an eye patch on the patient for comfort.  Will start on tobramycin drops.  Will given hydrocodone prn for pain.  Patient to follow-up with his opthalmologist Dr. Zigmund Daniel on Wednesday.  Patient to return for worsening pain, changes in vision, or any other concerning  symptoms.  Patient was also seen by Dr. Jeneen Rinks who agrees with the above plan.  Patient is stable for discharge.    I personally performed the services described in this documentation, which was scribed in my presence. The recorded information has been reviewed and is accurate.  Cherylann Parr, PA-C 07/03/14 [phone]

## 2014-07-03 NOTE — Discharge Instructions (Signed)
Corneal Abrasion °The cornea is the clear covering at the front and center of the eye. When looking at the colored portion of the eye (iris), you are looking through the cornea. This very thin tissue is made up of many layers. The surface layer is a single layer of cells (corneal epithelium) and is one of the most sensitive tissues in the body. If a scratch or injury causes the corneal epithelium to come off, it is called a corneal abrasion. If the injury extends to the tissues below the epithelium, the condition is called a corneal ulcer. °CAUSES  °· Scratches. °· Trauma. °· Foreign body in the eye. °Some people have recurrences of abrasions in the area of the original injury even after it has healed (recurrent erosion syndrome). Recurrent erosion syndrome generally improves and goes away with time. °SYMPTOMS  °· Eye pain. °· Difficulty or inability to keep the injured eye open. °· The eye becomes very sensitive to light. °· Recurrent erosions tend to happen suddenly, first thing in the morning, usually after waking up and opening the eye. °DIAGNOSIS  °Your health care provider can diagnose a corneal abrasion during an eye exam. Dye is usually placed in the eye using a drop or a small paper strip moistened by your tears. When the eye is examined with a special light, the abrasion shows up clearly because of the dye. °TREATMENT  °· Small abrasions may be treated with antibiotic drops or ointment alone. °· A pressure patch may be put over the eye. If this is done, follow your doctor's instructions for when to remove the patch. Do not drive or use machines while the eye patch is on. Judging distances is hard to do with a patch on. °If the abrasion becomes infected and spreads to the deeper tissues of the cornea, a corneal ulcer can result. This is serious because it can cause corneal scarring. Corneal scars interfere with light passing through the cornea and cause a loss of vision in the involved eye. °HOME CARE  INSTRUCTIONS °· Use medicine or ointment as directed. Only take over-the-counter or prescription medicines for pain, discomfort, or fever as directed by your health care provider. °· Do not drive or operate machinery if your eye is patched. Your ability to judge distances is impaired. °· If your health care provider has given you a follow-up appointment, it is very important to keep that appointment. Not keeping the appointment could result in a severe eye infection or permanent loss of vision. If there is any problem keeping the appointment, let your health care provider know. °SEEK MEDICAL CARE IF:  °· You have pain, light sensitivity, and a scratchy feeling in one eye or both eyes. °· Your pressure patch keeps loosening up, and you can blink your eye under the patch after treatment. °· Any kind of discharge develops from the eye after treatment or if the lids stick together in the morning. °· You have the same symptoms in the morning as you did with the original abrasion days, weeks, or months after the abrasion healed. °MAKE SURE YOU:  °· Understand these instructions. °· Will watch your condition. °· Will get help right away if you are not doing well or get worse. °Document Released: 09/05/2000 Document Revised: 09/13/2013 Document Reviewed: 05/16/2013 °[employer]® Patient Information ©2015 [employer]. This information is not intended to replace advice given to you by your health care provider. Make sure you discuss any questions you have with your health care provider. ° °

## 2014-07-03 NOTE — ED Notes (Signed)
Left ey blood shot. No vision change. States, "now clear drainage."  Dr. Rodena Piety (diabetic dr.) told to come here.

## 2014-07-03 NOTE — ED Notes (Signed)
Declined W/C at D/C and was escorted to lobby by RN. 

## 2014-07-07 ENCOUNTER — Other Ambulatory Visit: Payer: Self-pay | Admitting: Internal Medicine

## 2014-07-08 NOTE — ED Provider Notes (Signed)
Patient seen and examined. History reviewed. Exam shows some conjunctival injection. On fluorescein and slit lamp there is corneal abrasion. Plan is patch, adequate, pain medication, ophthalmology followup.  Tanna Furry, MD 07/08/14 [phone]

## 2014-07-11 ENCOUNTER — Other Ambulatory Visit: Payer: Self-pay | Admitting: Internal Medicine

## 2014-07-20 ENCOUNTER — Encounter: Payer: Self-pay | Admitting: Cardiology

## 2014-08-04 ENCOUNTER — Telehealth: Payer: Self-pay | Admitting: *Deleted

## 2014-08-04 NOTE — Telephone Encounter (Signed)
This is about the best we could ever do with him even when he saw the endocrinologist. I will ask if he would consider seeing the endocrinologist again. I have been seeing him for years.

## 2014-08-04 NOTE — Telephone Encounter (Signed)
I spoke with patient on end of study Accelerate visit.HGA1c is 8.4%. I asked patient to follow-up with Dr. Renold Genta on HgbA1c.

## 2014-08-28 ENCOUNTER — Telehealth: Payer: Self-pay

## 2014-08-28 NOTE — Telephone Encounter (Signed)
Spoke with patient regarding flu vaccine.  He says he had his injection with the [facility] in November of this year.

## 2014-11-14 ENCOUNTER — Other Ambulatory Visit: Payer: Medicare Other | Admitting: Internal Medicine

## 2014-11-14 ENCOUNTER — Ambulatory Visit (INDEPENDENT_AMBULATORY_CARE_PROVIDER_SITE_OTHER): Payer: Medicare Other | Admitting: Ophthalmology

## 2014-11-14 VITALS — BP 112/64

## 2014-11-14 DIAGNOSIS — E109 Type 1 diabetes mellitus without complications: Secondary | ICD-10-CM

## 2014-11-14 DIAGNOSIS — Z79899 Other long term (current) drug therapy: Secondary | ICD-10-CM

## 2014-11-14 DIAGNOSIS — E11319 Type 2 diabetes mellitus with unspecified diabetic retinopathy without macular edema: Secondary | ICD-10-CM

## 2014-11-14 DIAGNOSIS — E11359 Type 2 diabetes mellitus with proliferative diabetic retinopathy without macular edema: Secondary | ICD-10-CM

## 2014-11-14 DIAGNOSIS — E785 Hyperlipidemia, unspecified: Secondary | ICD-10-CM | POA: Diagnosis not present

## 2014-11-14 DIAGNOSIS — I10 Essential (primary) hypertension: Secondary | ICD-10-CM | POA: Diagnosis not present

## 2014-11-14 DIAGNOSIS — H35033 Hypertensive retinopathy, bilateral: Secondary | ICD-10-CM

## 2014-11-14 DIAGNOSIS — H43813 Vitreous degeneration, bilateral: Secondary | ICD-10-CM

## 2014-11-14 LAB — LIPID PANEL
CHOLESTEROL: 135 mg/dL (ref 0–200)
HDL: 41 mg/dL (ref >=40)
LDL Cholesterol: 81 mg/dL (ref 0–99)
Total CHOL/HDL Ratio: 3.3 Ratio
Triglycerides: 63 mg/dL (ref <150)
VLDL: 13 mg/dL (ref 0–40)

## 2014-11-14 LAB — HEPATIC FUNCTION PANEL
ALK PHOS: 77 U/L (ref 39–117)
ALT: 17 U/L (ref 0–53)
AST: 20 U/L (ref 0–37)
Albumin: 3.9 g/dL (ref 3.5–5.2)
BILIRUBIN TOTAL: 0.6 mg/dL (ref 0.2–1.2)
Bilirubin, Direct: 0.1 mg/dL (ref 0.0–0.3)
Indirect Bilirubin: 0.5 mg/dL (ref 0.2–1.2)
Total Protein: 6.7 g/dL (ref 6.0–8.3)

## 2014-11-14 LAB — HM DIABETES EYE EXAM

## 2014-11-14 LAB — HEMOGLOBIN A1C
HEMOGLOBIN A1C: 8.2 % — AB (ref <5.7)
Mean Plasma Glucose: 189 mg/dL — ABNORMAL HIGH (ref <117)

## 2014-11-15 ENCOUNTER — Other Ambulatory Visit: Payer: Self-pay | Admitting: Internal Medicine

## 2014-11-16 ENCOUNTER — Encounter: Payer: Self-pay | Admitting: Internal Medicine

## 2014-11-16 ENCOUNTER — Ambulatory Visit (INDEPENDENT_AMBULATORY_CARE_PROVIDER_SITE_OTHER): Payer: Medicare Other | Admitting: Internal Medicine

## 2014-11-16 VITALS — BP 114/78 | HR 70 | Temp 97.5°F | Wt 201.0 lb

## 2014-11-16 DIAGNOSIS — I10 Essential (primary) hypertension: Secondary | ICD-10-CM

## 2014-11-16 DIAGNOSIS — E119 Type 2 diabetes mellitus without complications: Secondary | ICD-10-CM | POA: Diagnosis not present

## 2014-11-16 DIAGNOSIS — E785 Hyperlipidemia, unspecified: Secondary | ICD-10-CM | POA: Diagnosis not present

## 2014-11-16 DIAGNOSIS — Z794 Long term (current) use of insulin: Secondary | ICD-10-CM

## 2014-11-16 DIAGNOSIS — G8929 Other chronic pain: Secondary | ICD-10-CM

## 2014-11-16 DIAGNOSIS — Z8679 Personal history of other diseases of the circulatory system: Secondary | ICD-10-CM | POA: Diagnosis not present

## 2014-11-16 DIAGNOSIS — N400 Enlarged prostate without lower urinary tract symptoms: Secondary | ICD-10-CM

## 2014-11-16 DIAGNOSIS — IMO0001 Reserved for inherently not codable concepts without codable children: Secondary | ICD-10-CM

## 2014-11-16 DIAGNOSIS — M549 Dorsalgia, unspecified: Secondary | ICD-10-CM

## 2014-11-16 NOTE — Addendum Note (Signed)
Addended by: Leota Jacobsen on: 11/16/2014 11:20 AM   Modules accepted: Orders

## 2014-11-16 NOTE — Progress Notes (Signed)
   Subjective:    Patient ID: Joseph Mora, male    DOB: 03/05/1933, 81 y.o.   MRN: [id number]  HPI  81 year old White Male with history of coronary artery disease, hyperlipidemia, insulin-dependent diabetes, BPH, chronic back pain, history of adenomatous colon polyps and hypertension for six-month recheck. He walks with a cane. He's been doing pretty well. One episode of chest pain just lasting a few seconds recently nothing sustained and no radiation of pain. He sees cardiologist yearly. His A1c tends to run in the low 8% range which it has done for years and he appears to be stable with that. Continues to live independently with his wife. Has an adopted son that has been into drugs and is actually stolen tools from him which has been quite worrisome.  Says he doesn't have much energy some days but tries to stay fairly active.  Lipid panel liver functions are within normal limits. Hemoglobin A1c 8.2%.  Says he's had recent diabetic eye exam.    Review of Systems     Objective:   Physical Exam Skin warm and dry. Nodes none. No carotid bruits. Chest clear to auscultation. Cardiac exam regular rate and rhythm normal S1 and S2. Extremities without edema.       Assessment & Plan:  Insulin-dependent diabetes mellitus stable with similar A1c for number of years  Hyperlipidemia-lipid panel liver functions normal  BPH treated with medication  History of heart disease and sees cardiologist yearly  Chronic back pain-walks with a cane  History of adenomatous colon polyps  Hypertension-stable  Plan: Return for physical exam in 6 months. Continue same medications.

## 2014-11-16 NOTE — Patient Instructions (Signed)
Continue same medications and return in 6 months 

## 2014-11-17 LAB — MICROALBUMIN / CREATININE URINE RATIO
CREATININE, URINE: 30 mg/dL
Microalb Creat Ratio: 6.7 mg/g (ref 0.0–30.0)
Microalb, Ur: 0.2 mg/dL (ref <2.0)

## 2014-11-30 DIAGNOSIS — E109 Type 1 diabetes mellitus without complications: Secondary | ICD-10-CM | POA: Diagnosis not present

## 2014-12-13 ENCOUNTER — Other Ambulatory Visit: Payer: Self-pay | Admitting: Internal Medicine

## 2014-12-26 ENCOUNTER — Telehealth: Payer: Self-pay | Admitting: *Deleted

## 2014-12-26 NOTE — Telephone Encounter (Signed)
Received a call from Luis M. Cintron she just wanted to let us know about 1 month ago patient had difficulty with some tingling sensation right leg lasting for approx 1 hour. Today she states she assessed patient memory status and states he could only remember 1 out of 3 things she just wanted to update [place] on his status. Tried to call patient wife to check on patient but didn't get an answer on their home phone will try to reach her later.

## 2014-12-27 NOTE — Telephone Encounter (Signed)
Patient wife called back she states the [employer] sends nurse out yaerly to evaluate patient. Wife states they are doing well. No changes to report.

## 2015-02-22 ENCOUNTER — Other Ambulatory Visit: Payer: Self-pay | Admitting: Internal Medicine

## 2015-03-02 DIAGNOSIS — E109 Type 1 diabetes mellitus without complications: Secondary | ICD-10-CM | POA: Diagnosis not present

## 2015-03-16 ENCOUNTER — Other Ambulatory Visit: Payer: Self-pay | Admitting: Internal Medicine

## 2015-04-02 ENCOUNTER — Ambulatory Visit (INDEPENDENT_AMBULATORY_CARE_PROVIDER_SITE_OTHER): Payer: Medicare Other | Admitting: Cardiovascular Disease

## 2015-04-02 ENCOUNTER — Encounter: Payer: Self-pay | Admitting: Cardiovascular Disease

## 2015-04-02 VITALS — BP 140/70 | HR 76 | Ht 64.5 in | Wt 205.8 lb

## 2015-04-02 DIAGNOSIS — I2510 Atherosclerotic heart disease of native coronary artery without angina pectoris: Secondary | ICD-10-CM | POA: Diagnosis not present

## 2015-04-02 DIAGNOSIS — E785 Hyperlipidemia, unspecified: Secondary | ICD-10-CM

## 2015-04-02 DIAGNOSIS — F17201 Nicotine dependence, unspecified, in remission: Secondary | ICD-10-CM | POA: Diagnosis not present

## 2015-04-02 NOTE — Progress Notes (Signed)
Chief Complaint  Patient presents with  . Leg Pain    History of Present Illness: [name] with history of CAD, DM, HLD, HTN, DJD here today for cardiac follow up. He has been followed in the past by Dr. Doreatha Mora. His cardiac history dates back to 34 when he had a stent placed in the Circumflex. He had a stent placed in the RCA in 2000. LVEF has been normal. Most recent stress test in July 2014 with normal LV function, no obvious ischemia.   He is here today for follow up. He tells me that he has chronic hip and back pain which limits his walking. No chest pain or SOB. No dizziness, near syncope or syncope.   Primary Care Physician: Joseph Mora  Last Lipid Profile:Lipid Panel     Component Value Date/Time   CHOL 135 11/14/2014 1036   TRIG 63 11/14/2014 1036   HDL 41 11/14/2014 1036   CHOLHDL 3.3 11/14/2014 1036   VLDL 13 11/14/2014 1036   LDLCALC 81 11/14/2014 1036    Past Medical History  Diagnosis Date  . CAD (coronary artery disease)   . Hyperlipidemia   . Diabetes mellitus     Past Surgical History  Procedure Laterality Date  . Carpal tunnel release    . Parathyroidectomy    . Eye surgery    . Inguinal hernia repair      Current Outpatient Prescriptions  Medication Sig Dispense Refill  . aspirin 81 MG tablet Take 81 mg by mouth daily.    Marland Kitchen atorvastatin (LIPITOR) 40 MG tablet TAKE 1 TABLET BY MOUTH DAILY 30 tablet 0  . doxazosin (CARDURA) 2 MG tablet Take 2 mg by mouth daily.    Marland Kitchen HUMULIN N 100 UNIT/ML injection INJECT 60 UNITS SUBCUTANEOUS BEFORE BREAKFAST AND INJECT 30 UNITS BEFORE DINNER 30 mL prn  . HYDROcodone-acetaminophen (NORCO/VICODIN) 5-325 MG per tablet Take 1 tablet by mouth every 6 (six) hours as needed for moderate pain or severe pain. 6 tablet 0  . insulin NPH Human (HUMULIN N,NOVOLIN N) 100 UNIT/ML injection Inject 62 Units into the skin daily before breakfast. 32 units before dinner    . Insulin Syringe-Needle U-100 (INSULIN SYRINGE  1CC/30GX5/16") 30G X 5/16" 1 ML MISC USE TWICE DAILY OR AS DIRECTED 100 each 0  . lisinopril (PRINIVIL,ZESTRIL) 20 MG tablet TAKE 1 TABLET BY MOUTH EVERY DAY 30 tablet 5  . lisinopril (PRINIVIL,ZESTRIL) 20 MG tablet TAKE 1 TABLET BY MOUTH EVERY DAY 30 tablet 0  . metoprolol (LOPRESSOR) 50 MG tablet TAKE 1 TABLET BY MOUTH TWICE DAILY 60 tablet 5  . nitroGLYCERIN (NITROSTAT) 0.4 MG SL tablet Place 1 tablet (0.4 mg total) under the tongue every 5 (five) minutes as needed for chest pain. 25 tablet 6  . tamsulosin (FLOMAX) 0.4 MG CAPS capsule TAKE ONE CAPSULE BY MOUTH EVERY DAY. 30 capsule 11  . tamsulosin (FLOMAX) 0.4 MG CAPS capsule TAKE ONE CAPSULE BY MOUTH EVERY DAY 30 capsule 11   No current facility-administered medications for this visit.    Allergies  Allergen Reactions  . Morphine And Related Itching  . Penicillins Hives    History   Social History  . Marital Status: Married    Spouse Name: N/A  . Number of Children: N/A  . Years of Education: N/A   Occupational History  . Not on file.   Social History Main Topics  . Smoking status: Former Smoker    Quit date: 08/03/2010  . Smokeless tobacco:  Current User    Types: Chew  . Alcohol Use: No  . Drug Use: No  . Sexual Activity: Not on file   Other Topics Concern  . Not on file   Social History Narrative    Family History  Problem Relation Age of Onset  . Heart attack Paternal Aunt   . Stroke Mother     Review of Systems:  As stated in the HPI and otherwise negative.   BP 140/70 mmHg  Pulse 76  Ht 5' 4.5" (1.638 m)  Wt 205 lb 12.8 oz (93.35 kg)  BMI 34.79 kg/m2  Physical Examination: General: Well developed, well nourished, NAD HEENT: OP clear, mucus membranes moist SKIN: warm, dry. No rashes. Neuro: No focal deficits Musculoskeletal: Muscle strength 5/5 all ext Psychiatric: Mood and affect normal Neck: No JVD, no carotid bruits, no thyromegaly, no lymphadenopathy. Lungs:Clear bilaterally, no wheezes,  rhonci, crackles Cardiovascular: Regular rate and rhythm. No murmurs, gallops or rubs. Abdomen:Soft. Bowel sounds present. Non-tender.  Extremities: No lower extremity edema. Pulses are 2 + in the bilateral DP/PT.  EKG:  EKG is ordered today. The ekg ordered today demonstrates sinus, rate 76 bpm. 1st degree AV block. RBBB  Recent Labs: 05/09/2014: BUN 9; Creat 0.98; Hemoglobin 16.5; Platelets 125*; Potassium 4.0; Sodium 139 11/14/2014: ALT 17   Lipid Panel    Component Value Date/Time   CHOL 135 11/14/2014 1036   TRIG 63 11/14/2014 1036   HDL 41 11/14/2014 1036   CHOLHDL 3.3 11/14/2014 1036   VLDL 13 11/14/2014 1036   LDLCALC 81 11/14/2014 1036     Wt Readings from Last 3 Encounters:  04/02/15 205 lb 12.8 oz (93.35 kg)  11/16/14 201 lb (91.173 kg)  07/03/14 199 lb 6 oz (90.436 kg)     Other studies Reviewed: Additional studies/ records that were reviewed today include: . Review of the above records demonstrates:    Assessment and Plan:   1. CAD:  Stable. Will continue current therapy.  2. HLD: lipids well controlled on statin. No changes.   3. Tobacco abuse, in remission: He stopped smoking 2010.   Current medicines are reviewed at length with the patient today.  The patient does not have concerns regarding medicines.  The following changes have been made:  no change  Labs/ tests ordered today include:   Orders Placed This Encounter  Procedures  . EKG 12-Lead    Disposition:   FU with me in 12  months  Signed, Joseph Chandler, MD 04/02/2015 3:06 PM    [facility] [address], [place], [place]  [address] Phone: [phone]; Fax: [phone]

## 2015-04-02 NOTE — Patient Instructions (Signed)
Medication Instructions:  Your physician recommends that you continue on your current medications as directed. Please refer to the Current Medication list given to you today.   Labwork: none  Testing/Procedures: none  Follow-Up: Your physician wants you to follow-up in:  12 months.  You will receive a reminder letter in the mail two months in advance. If you don't receive a letter, please call our office to schedule the follow-up appointment.        

## 2015-05-14 ENCOUNTER — Other Ambulatory Visit: Payer: Self-pay | Admitting: Internal Medicine

## 2015-05-15 ENCOUNTER — Ambulatory Visit (INDEPENDENT_AMBULATORY_CARE_PROVIDER_SITE_OTHER): Payer: Medicare Other | Admitting: Ophthalmology

## 2015-05-15 DIAGNOSIS — E11359 Type 2 diabetes mellitus with proliferative diabetic retinopathy without macular edema: Secondary | ICD-10-CM

## 2015-05-15 DIAGNOSIS — H35033 Hypertensive retinopathy, bilateral: Secondary | ICD-10-CM | POA: Diagnosis not present

## 2015-05-15 DIAGNOSIS — H43813 Vitreous degeneration, bilateral: Secondary | ICD-10-CM

## 2015-05-15 DIAGNOSIS — E11319 Type 2 diabetes mellitus with unspecified diabetic retinopathy without macular edema: Secondary | ICD-10-CM | POA: Diagnosis not present

## 2015-05-15 DIAGNOSIS — I10 Essential (primary) hypertension: Secondary | ICD-10-CM | POA: Diagnosis not present

## 2015-05-17 ENCOUNTER — Other Ambulatory Visit: Payer: Medicare Other | Admitting: Internal Medicine

## 2015-05-17 DIAGNOSIS — E119 Type 2 diabetes mellitus without complications: Secondary | ICD-10-CM | POA: Diagnosis not present

## 2015-05-17 DIAGNOSIS — E785 Hyperlipidemia, unspecified: Secondary | ICD-10-CM

## 2015-05-17 DIAGNOSIS — Z79899 Other long term (current) drug therapy: Secondary | ICD-10-CM

## 2015-05-17 DIAGNOSIS — R5383 Other fatigue: Secondary | ICD-10-CM

## 2015-05-17 DIAGNOSIS — Z125 Encounter for screening for malignant neoplasm of prostate: Secondary | ICD-10-CM

## 2015-05-17 LAB — LIPID PANEL
Cholesterol: 118 mg/dL — ABNORMAL LOW (ref 125–200)
HDL: 41 mg/dL (ref >=40)
LDL CALC: 65 mg/dL (ref <130)
Total CHOL/HDL Ratio: 2.9 Ratio (ref <=5.0)
Triglycerides: 61 mg/dL (ref <150)
VLDL: 12 mg/dL (ref <30)

## 2015-05-17 LAB — COMPLETE METABOLIC PANEL WITH GFR
ALT: 25 U/L (ref 9–46)
AST: 22 U/L (ref 10–35)
Albumin: 3.8 g/dL (ref 3.6–5.1)
Alkaline Phosphatase: 71 U/L (ref 40–115)
BUN: 10 mg/dL (ref 7–25)
CALCIUM: 8.6 mg/dL (ref 8.6–10.3)
CHLORIDE: 103 mmol/L (ref 98–110)
CO2: 30 mmol/L (ref 20–31)
Creat: 1.02 mg/dL (ref 0.70–1.11)
GFR, Est African American: 80 mL/min (ref >=60)
GFR, Est Non African American: 69 mL/min (ref >=60)
Glucose, Bld: 60 mg/dL — ABNORMAL LOW (ref 65–99)
POTASSIUM: 3.9 mmol/L (ref 3.5–5.3)
Sodium: 142 mmol/L (ref 135–146)
Total Bilirubin: 0.8 mg/dL (ref 0.2–1.2)
Total Protein: 6 g/dL — ABNORMAL LOW (ref 6.1–8.1)

## 2015-05-17 LAB — CBC WITH DIFFERENTIAL/PLATELET
BASOS ABS: 0 10*3/uL (ref 0.0–0.1)
BASOS PCT: 0 % (ref 0–1)
Eosinophils Absolute: 0.1 10*3/uL (ref 0.0–0.7)
Eosinophils Relative: 2 % (ref 0–5)
HCT: 50.4 % (ref 39.0–52.0)
Hemoglobin: 17.1 g/dL — ABNORMAL HIGH (ref 13.0–17.0)
Lymphocytes Relative: 22 % (ref 12–46)
Lymphs Abs: 1.5 10*3/uL (ref 0.7–4.0)
MCH: 32.1 pg (ref 26.0–34.0)
MCHC: 33.9 g/dL (ref 30.0–36.0)
MCV: 94.7 fL (ref 78.0–100.0)
MPV: 10.4 fL (ref 8.6–12.4)
Monocytes Absolute: 0.6 10*3/uL (ref 0.1–1.0)
Monocytes Relative: 8 % (ref 3–12)
Neutro Abs: 4.8 10*3/uL (ref 1.7–7.7)
Neutrophils Relative %: 68 % (ref 43–77)
PLATELETS: 119 10*3/uL — AB (ref 150–400)
RBC: 5.32 MIL/uL (ref 4.22–5.81)
RDW: 14.2 % (ref 11.5–15.5)
WBC: 7 10*3/uL (ref 4.0–10.5)

## 2015-05-17 LAB — HEMOGLOBIN A1C
HEMOGLOBIN A1C: 8.2 % — AB (ref <5.7)
MEAN PLASMA GLUCOSE: 189 mg/dL — AB (ref <117)

## 2015-05-18 ENCOUNTER — Encounter: Payer: Medicare Other | Admitting: Internal Medicine

## 2015-05-18 ENCOUNTER — Encounter: Payer: Self-pay | Admitting: Internal Medicine

## 2015-05-18 LAB — PSA, MEDICARE: PSA: 1.86 ng/mL (ref <=4.00)

## 2015-05-25 ENCOUNTER — Emergency Department (HOSPITAL_COMMUNITY): Payer: Medicare Other

## 2015-05-25 ENCOUNTER — Emergency Department (HOSPITAL_COMMUNITY)
Admission: EM | Admit: 2015-05-25 | Discharge: 2015-05-25 | Disposition: A | Discharge status: Home / Self Care | Payer: Medicare Other | Attending: Emergency Medicine | Admitting: Emergency Medicine

## 2015-05-25 ENCOUNTER — Encounter (HOSPITAL_COMMUNITY): Payer: Self-pay | Admitting: *Deleted

## 2015-05-25 DIAGNOSIS — E785 Hyperlipidemia, unspecified: Secondary | ICD-10-CM | POA: Insufficient documentation

## 2015-05-25 DIAGNOSIS — Z79899 Other long term (current) drug therapy: Secondary | ICD-10-CM | POA: Diagnosis not present

## 2015-05-25 DIAGNOSIS — T782XXA Anaphylactic shock, unspecified, initial encounter: Secondary | ICD-10-CM

## 2015-05-25 DIAGNOSIS — I2510 Atherosclerotic heart disease of native coronary artery without angina pectoris: Secondary | ICD-10-CM | POA: Insufficient documentation

## 2015-05-25 DIAGNOSIS — Y929 Unspecified place or not applicable: Secondary | ICD-10-CM | POA: Diagnosis not present

## 2015-05-25 DIAGNOSIS — Y939 Activity, unspecified: Secondary | ICD-10-CM | POA: Diagnosis not present

## 2015-05-25 DIAGNOSIS — Y999 Unspecified external cause status: Secondary | ICD-10-CM | POA: Diagnosis not present

## 2015-05-25 DIAGNOSIS — T7840XA Allergy, unspecified, initial encounter: Secondary | ICD-10-CM | POA: Diagnosis not present

## 2015-05-25 DIAGNOSIS — Z87891 Personal history of nicotine dependence: Secondary | ICD-10-CM | POA: Diagnosis not present

## 2015-05-25 DIAGNOSIS — E119 Type 2 diabetes mellitus without complications: Secondary | ICD-10-CM | POA: Diagnosis not present

## 2015-05-25 DIAGNOSIS — T63421A Toxic effect of venom of ants, accidental (unintentional), initial encounter: Secondary | ICD-10-CM | POA: Insufficient documentation

## 2015-05-25 DIAGNOSIS — R42 Dizziness and giddiness: Secondary | ICD-10-CM | POA: Diagnosis not present

## 2015-05-25 DIAGNOSIS — Z7982 Long term (current) use of aspirin: Secondary | ICD-10-CM | POA: Insufficient documentation

## 2015-05-25 DIAGNOSIS — Z880 Allergy status to penicillin: Secondary | ICD-10-CM | POA: Diagnosis not present

## 2015-05-25 DIAGNOSIS — Z794 Long term (current) use of insulin: Secondary | ICD-10-CM | POA: Insufficient documentation

## 2015-05-25 DIAGNOSIS — R401 Stupor: Secondary | ICD-10-CM | POA: Diagnosis not present

## 2015-05-25 DIAGNOSIS — L299 Pruritus, unspecified: Secondary | ICD-10-CM | POA: Diagnosis present

## 2015-05-25 LAB — COMPREHENSIVE METABOLIC PANEL
ALT: 36 U/L (ref 17–63)
AST: 65 U/L — AB (ref 15–41)
Albumin: 3.2 g/dL — ABNORMAL LOW (ref 3.5–5.0)
Alkaline Phosphatase: 64 U/L (ref 38–126)
Anion gap: 7 (ref 5–15)
BUN: 13 mg/dL (ref 6–20)
CO2: 24 mmol/L (ref 22–32)
CREATININE: 1.26 mg/dL — AB (ref 0.61–1.24)
Calcium: 8 mg/dL — ABNORMAL LOW (ref 8.9–10.3)
Chloride: 101 mmol/L (ref 101–111)
GFR calc Af Amer: >60 mL/min (ref >60)
GFR calc non Af Amer: 52 mL/min — ABNORMAL LOW (ref >60)
Glucose, Bld: 220 mg/dL — ABNORMAL HIGH (ref 65–99)
POTASSIUM: 7.2 mmol/L — AB (ref 3.5–5.1)
SODIUM: 132 mmol/L — AB (ref 135–145)
Total Bilirubin: 1.9 mg/dL — ABNORMAL HIGH (ref 0.3–1.2)
Total Protein: >12 g/dL — ABNORMAL HIGH (ref 6.5–8.1)

## 2015-05-25 LAB — CBC
HCT: 51.2 % (ref 39.0–52.0)
Hemoglobin: 17.9 g/dL — ABNORMAL HIGH (ref 13.0–17.0)
MCH: 32.3 pg (ref 26.0–34.0)
MCHC: 35 g/dL (ref 30.0–36.0)
MCV: 92.4 fL (ref 78.0–100.0)
PLATELETS: 133 10*3/uL — AB (ref 150–400)
RBC: 5.54 MIL/uL (ref 4.22–5.81)
RDW: 13.3 % (ref 11.5–15.5)
WBC: 12.8 10*3/uL — AB (ref 4.0–10.5)

## 2015-05-25 LAB — POTASSIUM: POTASSIUM: 4.3 mmol/L (ref 3.5–5.1)

## 2015-05-25 MED ORDER — DIPHENHYDRAMINE HCL 25 MG PO TABS
25.0000 mg | ORAL_TABLET | Freq: Four times a day (QID) | ORAL | Status: DC
Start: 2015-05-25 — End: 2015-06-01

## 2015-05-25 MED ORDER — SODIUM CHLORIDE 0.9 % IV BOLUS (SEPSIS)
1000.0000 mL | Freq: Once | INTRAVENOUS | Status: AC
  Administered 2015-05-25: 1000 mL via INTRAVENOUS

## 2015-05-25 MED ORDER — METHYLPREDNISOLONE SODIUM SUCC 125 MG IJ SOLR
125.0000 mg | Freq: Once | INTRAMUSCULAR | Status: AC
  Administered 2015-05-25: 125 mg via INTRAVENOUS
  Filled 2015-05-25: qty 2

## 2015-05-25 MED ORDER — EPINEPHRINE 0.15 MG/0.3ML IJ SOAJ
0.1500 mg | Freq: Once | INTRAMUSCULAR | Status: AC
  Administered 2015-05-25: 0.15 mg via INTRAMUSCULAR
  Filled 2015-05-25 (×2): qty 0.3

## 2015-05-25 MED ORDER — PREDNISONE 10 MG PO TABS: 50.0000 mg | ORAL_TABLET | Freq: Every day | ORAL | Status: DC

## 2015-05-25 MED ORDER — EPINEPHRINE 0.15 MG/0.3ML IJ SOAJ: 0.1500 mg | Freq: Once | INTRAMUSCULAR | Status: DC

## 2015-05-25 MED ORDER — DIPHENHYDRAMINE HCL 50 MG/ML IJ SOLN
25.0000 mg | Freq: Once | INTRAMUSCULAR | Status: AC
  Administered 2015-05-25: 25 mg via INTRAVENOUS
  Filled 2015-05-25: qty 1

## 2015-05-25 NOTE — ED Notes (Signed)
Pt in c/o weakness, pt states he keeps "passing out", pt was slow to respond on arrival, pale, answering some questions, pt diaphoretic

## 2015-05-25 NOTE — ED Notes (Signed)
Pt c/o sudden numbness to bilateral legs, mild drift noted to R arm, significant drift noted to L arm. MD made aware. Dr. Claretta Fraise at bedside

## 2015-05-25 NOTE — ED Provider Notes (Signed)
CSN: [id number]     Arrival date & time 05/25/15  1925 History   First MD Initiated Contact with Patient 05/25/15 1933     Chief Complaint  Patient presents with  . Altered Mental Status   Patient is a 81 y.o. male presenting with general illness. The history is provided by the patient and a relative.  Illness Location:  Generalized Quality:  Itching, tongue fullness Severity:  Severe Onset quality:  Gradual Timing:  Constant Progression:  Worsening Chronicity:  New Context:  Generalized itching and tongue "fullness". Patient states that her that he was working outside when he was bit by several Physiological scientist. Shortly after this to be an expansion generalized itching. Her this evening patient had progressed to experiencing fullness of his tongue. Patient denies fever, chills, cough, difficulty breathing, difficulty swallowing, wheezing, nausea, vomiting, diarrhea, or previous history of allergic or anaphylactic reaction. Patient did not take any medications at home. Associated symptoms: rash   Associated symptoms: no abdominal pain, no chest pain, no cough, no diarrhea, no fever, no nausea, no shortness of breath and no wheezing     Past Medical History  Diagnosis Date  . CAD (coronary artery disease)   . Hyperlipidemia   . Diabetes mellitus    Past Surgical History  Procedure Laterality Date  . Carpal tunnel release    . Parathyroidectomy    . Eye surgery    . Inguinal hernia repair     Family History  Problem Relation Age of Onset  . Heart attack Paternal Aunt   . Stroke Mother    Social History  Substance Use Topics  . Smoking status: Former Smoker    Quit date: 08/03/2010  . Smokeless tobacco: Current User    Types: Chew  . Alcohol Use: No    Review of Systems  Constitutional: Negative for fever.  HENT:       Tongue swelling  Respiratory: Negative for cough, shortness of breath and wheezing.   Cardiovascular: Negative for chest pain.  Gastrointestinal: Negative for  nausea, abdominal pain and diarrhea.  Skin: Positive for rash.  Allergic/Immunologic: Negative for environmental allergies, food allergies and immunocompromised state.  All other systems reviewed and are negative.   Allergies  Morphine and related and Penicillins  Home Medications   Prior to Admission medications   Medication Sig Start Date End Date Taking? Authorizing Provider  aspirin 81 MG tablet Take 81 mg by mouth daily. 10/01/11  Yes Burnell Blanks, MD  atorvastatin (LIPITOR) 40 MG tablet TAKE 1 TABLET BY MOUTH DAILY 05/14/15  Yes Elby Showers, MD  doxazosin (CARDURA) 2 MG tablet Take 2 mg by mouth daily.   Yes Historical Provider, MD  HUMULIN N 100 UNIT/ML injection INJECT 60 UNITS SUBCUTANEOUS BEFORE BREAKFAST AND INJECT 30 UNITS BEFORE DINNER 07/11/14  Yes Elby Showers, MD  lisinopril (PRINIVIL,ZESTRIL) 20 MG tablet TAKE 1 TABLET BY MOUTH EVERY DAY 12/14/14  Yes Elby Showers, MD  metoprolol (LOPRESSOR) 50 MG tablet TAKE 1 TABLET BY MOUTH TWICE DAILY 12/14/14  Yes Elby Showers, MD  nitroGLYCERIN (NITROSTAT) 0.4 MG SL tablet Place 1 tablet (0.4 mg total) under the tongue every 5 (five) minutes as needed for chest pain. 03/31/14  Yes Burnell Blanks, MD  tamsulosin (FLOMAX) 0.4 MG CAPS capsule TAKE ONE CAPSULE BY MOUTH EVERY DAY 02/22/15  Yes Elby Showers, MD  diphenhydrAMINE (BENADRYL) 25 MG tablet Take 1 tablet (25 mg total) by mouth every 6 (six) hours. 05/25/15  Mayer Camel, MD  doxazosin (CARDURA) 2 MG tablet TAKE 1 TABLET BY MOUTH EVERY NIGHT AT BEDTIME Patient not taking: Reported on 05/25/2015 05/14/15   Elby Showers, MD  EPINEPHrine (EPIPEN JR) 0.15 MG/0.3ML injection Inject 0.3 mLs (0.15 mg total) into the muscle once. 05/25/15   Mayer Camel, MD  HYDROcodone-acetaminophen (NORCO/VICODIN) 5-325 MG per tablet Take 1 tablet by mouth every 6 (six) hours as needed for moderate pain or severe pain. Patient not taking: Reported on 05/25/2015 07/03/14   Starlyn Skeans,  PA-C  Insulin Syringe-Needle U-100 (INSULIN SYRINGE 1CC/30GX5/16") 30G X 5/16" 1 ML MISC USE TWICE DAILY OR AS DIRECTED 03/16/15   Elby Showers, MD  lisinopril (PRINIVIL,ZESTRIL) 20 MG tablet TAKE 1 TABLET BY MOUTH EVERY DAY Patient not taking: Reported on 05/25/2015 02/22/15   Elby Showers, MD  predniSONE (DELTASONE) 10 MG tablet Take 5 tablets (50 mg total) by mouth daily. 05/25/15   Mayer Camel, MD  tamsulosin (FLOMAX) 0.4 MG CAPS capsule TAKE ONE CAPSULE BY MOUTH EVERY DAY. Patient not taking: Reported on 05/25/2015 07/07/14   Elby Showers, MD   BP 132/62 mmHg  Pulse 70  Temp(Src) 98.7 F (37.1 C) (Oral)  Resp 20  SpO2 96%   Physical Exam  Constitutional: He is oriented to person, place, and time. He appears distressed.  HENT:  Head: Normocephalic and atraumatic.  Eyes: Conjunctivae are normal. Pupils are equal, round, and reactive to light.  Neck: Normal range of motion. Neck supple.  Cardiovascular: Normal rate.   Isolated BP of 89/50, improved rapidly with IV fluids and epinephrine.  Pulmonary/Chest: Effort normal and breath sounds normal.  Patient well on room air and maintaining saturations without supplemental option, lung clear to auscultation bilaterally  Abdominal: Soft. Bowel sounds are normal.  Musculoskeletal: Normal range of motion.  Neurological: He is alert and oriented to person, place, and time.  Skin: Skin is warm and dry. He is not diaphoretic.    ED Course  Procedures   Labs Review Labs Reviewed  COMPREHENSIVE METABOLIC PANEL - Abnormal; Notable for the following:    Sodium 132 (*)    Potassium 7.2 (*)    Glucose, Bld 220 (*)    Creatinine, Ser 1.26 (*)    Calcium 8.0 (*)    Total Protein >12.0 (*)    Albumin 3.2 (*)    AST 65 (*)    Total Bilirubin 1.9 (*)    GFR calc non Af Amer 52 (*)    All other components within normal limits  CBC - Abnormal; Notable for the following:    WBC 12.8 (*)    Hemoglobin 17.9 (*)    Platelets 133 (*)    All  other components within normal limits  POTASSIUM  CBG MONITORING, ED   Imaging Review Dg Chest Portable 1 View  05/25/2015   CLINICAL DATA:  Anaphylaxis, unresponsive.  EXAM: PORTABLE CHEST - 1 VIEW  COMPARISON:  06/13/2010 and prior radiographs  FINDINGS: The cardiomediastinal silhouette is unremarkable.  This is a low volume film.  There is no evidence of focal airspace disease, pulmonary edema, suspicious pulmonary nodule/mass, pleural effusion, or pneumothorax. No acute bony abnormalities are identified.  IMPRESSION: No active disease.   Electronically Signed   By: Margarette Canada M.D.   On: 05/25/2015 20:48   I have personally reviewed and evaluated these images and lab results as part of my medical decision-making.   EKG Interpretation None      MDM  Mr.  Hammar is a 81 year old male who presented via personal vehicle with generalized itching and tongue "fullness". Patient states that her that he was working outside when he was bit by several Physiological scientist. Shortly after this to be an expansion generalized itching. Her this evening patient had progressed to experiencing fullness of his tongue. Patient denies fever, chills, cough, difficulty breathing, difficulty swallowing, wheezing, nausea, vomiting, diarrhea, or previous history of allergic or anaphylactic reaction. Patient did not take any medications at home.  Exam above notable for a male lying in stretcher in moderate distress. Afebrile. Not tachycardic. Breathing well on room air and maintaining saturations without supplemental oxygen. Isolated BP of 89/50 - given epinephrine,Solumedrol, and Benadryl shortly following this. IV fluids also started. Diffuse erythematous rash. Posterior oropharynx without erythema or edema. Lungs clear to auscultation bilaterally.  Patient's blood pressure improved dramatically with IV fluids and above-stated medications. Patient observed in the emergency department for a period of 4 hours without evidence of  rebound symptoms. Patient discharged home in stable condition with prescription for a prednisone burst as well as Benadryl as well as EpiPen.- patient has a history of diabetes and family counseled about close monitoring of his glucose over the next 4-5 days. Strict ED precautions discussed. Patient and patient's family understands and agrees with plan and has no further questions or concerns this time.  Patient care discussed with Eddie Dibbles by my attending, Dr. Serita Grit  Final diagnoses:  Allergic reaction, initial encounter  Anaphylaxis, initial encounter    Mayer Camel, MD 05/26/15 2979  Serita Grit, MD 05/27/15 [phone]

## 2015-05-25 NOTE — Discharge Instructions (Signed)

## 2015-05-25 NOTE — ED Notes (Signed)
CBG 234  

## 2015-05-25 NOTE — ED Notes (Signed)
Pt c/o frequent episodes of syncope. Pt pale with slow responses on arrival. Pt has become more alert since assisted to bed. Rash noted to bilateral axilla and lateral abdomen. Reports being "bite by fire ants" and a tick bite x 1 month ago. Pt also c/o L leg pain and bilateral leg numbness. Pt states "my tongue feels swollen." Airway intact

## 2015-05-25 NOTE — ED Notes (Signed)
MD made aware of decreasing blood pressure.

## 2015-05-25 NOTE — ED Notes (Signed)
Pt reports improvement of swelling to tongue

## 2015-05-28 ENCOUNTER — Observation Stay (HOSPITAL_COMMUNITY)
Admission: EM | Admit: 2015-05-28 | Discharge: 2015-06-01 | Disposition: A | Discharge status: Home / Self Care | Payer: Medicare Other | Attending: Internal Medicine | Admitting: Internal Medicine

## 2015-05-28 ENCOUNTER — Encounter (HOSPITAL_COMMUNITY): Payer: Self-pay | Admitting: Emergency Medicine

## 2015-05-28 DIAGNOSIS — Z880 Allergy status to penicillin: Secondary | ICD-10-CM | POA: Insufficient documentation

## 2015-05-28 DIAGNOSIS — T783XXS Angioneurotic edema, sequela: Secondary | ICD-10-CM | POA: Diagnosis not present

## 2015-05-28 DIAGNOSIS — E119 Type 2 diabetes mellitus without complications: Secondary | ICD-10-CM | POA: Insufficient documentation

## 2015-05-28 DIAGNOSIS — G9340 Encephalopathy, unspecified: Secondary | ICD-10-CM | POA: Diagnosis present

## 2015-05-28 DIAGNOSIS — N179 Acute kidney failure, unspecified: Secondary | ICD-10-CM | POA: Diagnosis not present

## 2015-05-28 DIAGNOSIS — R410 Disorientation, unspecified: Secondary | ICD-10-CM | POA: Diagnosis not present

## 2015-05-28 DIAGNOSIS — I2510 Atherosclerotic heart disease of native coronary artery without angina pectoris: Secondary | ICD-10-CM | POA: Diagnosis not present

## 2015-05-28 DIAGNOSIS — T783XXA Angioneurotic edema, initial encounter: Principal | ICD-10-CM | POA: Diagnosis present

## 2015-05-28 DIAGNOSIS — R945 Abnormal results of liver function studies: Secondary | ICD-10-CM

## 2015-05-28 DIAGNOSIS — I493 Ventricular premature depolarization: Secondary | ICD-10-CM | POA: Insufficient documentation

## 2015-05-28 DIAGNOSIS — N400 Enlarged prostate without lower urinary tract symptoms: Secondary | ICD-10-CM | POA: Insufficient documentation

## 2015-05-28 DIAGNOSIS — W57XXXA Bitten or stung by nonvenomous insect and other nonvenomous arthropods, initial encounter: Secondary | ICD-10-CM | POA: Insufficient documentation

## 2015-05-28 DIAGNOSIS — Y998 Other external cause status: Secondary | ICD-10-CM | POA: Insufficient documentation

## 2015-05-28 DIAGNOSIS — Z955 Presence of coronary angioplasty implant and graft: Secondary | ICD-10-CM | POA: Insufficient documentation

## 2015-05-28 DIAGNOSIS — T464X5A Adverse effect of angiotensin-converting-enzyme inhibitors, initial encounter: Secondary | ICD-10-CM

## 2015-05-28 DIAGNOSIS — Z794 Long term (current) use of insulin: Secondary | ICD-10-CM | POA: Insufficient documentation

## 2015-05-28 DIAGNOSIS — E785 Hyperlipidemia, unspecified: Secondary | ICD-10-CM | POA: Diagnosis not present

## 2015-05-28 DIAGNOSIS — R627 Adult failure to thrive: Secondary | ICD-10-CM | POA: Insufficient documentation

## 2015-05-28 DIAGNOSIS — T148 Other injury of unspecified body region: Secondary | ICD-10-CM | POA: Diagnosis not present

## 2015-05-28 DIAGNOSIS — Z885 Allergy status to narcotic agent status: Secondary | ICD-10-CM | POA: Insufficient documentation

## 2015-05-28 DIAGNOSIS — L509 Urticaria, unspecified: Secondary | ICD-10-CM

## 2015-05-28 DIAGNOSIS — Y9289 Other specified places as the place of occurrence of the external cause: Secondary | ICD-10-CM | POA: Diagnosis not present

## 2015-05-28 DIAGNOSIS — G8929 Other chronic pain: Secondary | ICD-10-CM

## 2015-05-28 DIAGNOSIS — I10 Essential (primary) hypertension: Secondary | ICD-10-CM | POA: Diagnosis present

## 2015-05-28 DIAGNOSIS — Z720 Tobacco use: Secondary | ICD-10-CM | POA: Diagnosis not present

## 2015-05-28 DIAGNOSIS — Z7982 Long term (current) use of aspirin: Secondary | ICD-10-CM | POA: Insufficient documentation

## 2015-05-28 DIAGNOSIS — IMO0001 Reserved for inherently not codable concepts without codable children: Secondary | ICD-10-CM

## 2015-05-28 DIAGNOSIS — Y9389 Activity, other specified: Secondary | ICD-10-CM | POA: Insufficient documentation

## 2015-05-28 DIAGNOSIS — R600 Localized edema: Secondary | ICD-10-CM | POA: Diagnosis not present

## 2015-05-28 DIAGNOSIS — R7989 Other specified abnormal findings of blood chemistry: Secondary | ICD-10-CM

## 2015-05-28 DIAGNOSIS — I5030 Unspecified diastolic (congestive) heart failure: Secondary | ICD-10-CM | POA: Insufficient documentation

## 2015-05-28 DIAGNOSIS — S00521A Blister (nonthermal) of lip, initial encounter: Secondary | ICD-10-CM | POA: Diagnosis not present

## 2015-05-28 DIAGNOSIS — M549 Dorsalgia, unspecified: Secondary | ICD-10-CM

## 2015-05-28 DIAGNOSIS — R609 Edema, unspecified: Secondary | ICD-10-CM

## 2015-05-28 HISTORY — DX: Unspecified osteoarthritis, unspecified site: M19.90

## 2015-05-28 HISTORY — DX: Acute myocardial infarction, unspecified: I21.9

## 2015-05-28 LAB — COMPREHENSIVE METABOLIC PANEL
ALK PHOS: 46 U/L (ref 38–126)
ALT: 30 U/L (ref 17–63)
ANION GAP: 7 (ref 5–15)
AST: 53 U/L — ABNORMAL HIGH (ref 15–41)
Albumin: 2.8 g/dL — ABNORMAL LOW (ref 3.5–5.0)
BUN: 29 mg/dL — ABNORMAL HIGH (ref 6–20)
CALCIUM: 8.1 mg/dL — AB (ref 8.9–10.3)
CO2: 25 mmol/L (ref 22–32)
Chloride: 102 mmol/L (ref 101–111)
Creatinine, Ser: 1.52 mg/dL — ABNORMAL HIGH (ref 0.61–1.24)
GFR, EST AFRICAN AMERICAN: 48 mL/min — AB (ref >60)
GFR, EST NON AFRICAN AMERICAN: 42 mL/min — AB (ref >60)
Glucose, Bld: 228 mg/dL — ABNORMAL HIGH (ref 65–99)
Potassium: 4.2 mmol/L (ref 3.5–5.1)
SODIUM: 134 mmol/L — AB (ref 135–145)
TOTAL PROTEIN: 4.8 g/dL — AB (ref 6.5–8.1)
Total Bilirubin: 1.3 mg/dL — ABNORMAL HIGH (ref 0.3–1.2)

## 2015-05-28 LAB — CBC WITH DIFFERENTIAL/PLATELET
BASOS ABS: 0 10*3/uL (ref 0.0–0.1)
BASOS PCT: 0 % (ref 0–1)
EOS ABS: 0.1 10*3/uL (ref 0.0–0.7)
Eosinophils Relative: 1 % (ref 0–5)
HEMATOCRIT: 47.6 % (ref 39.0–52.0)
HEMOGLOBIN: 16.8 g/dL (ref 13.0–17.0)
Lymphocytes Relative: 12 % (ref 12–46)
Lymphs Abs: 0.8 10*3/uL (ref 0.7–4.0)
MCH: 32.6 pg (ref 26.0–34.0)
MCHC: 35.3 g/dL (ref 30.0–36.0)
MCV: 92.4 fL (ref 78.0–100.0)
MONOS PCT: 5 % (ref 3–12)
Monocytes Absolute: 0.4 10*3/uL (ref 0.1–1.0)
NEUTROS ABS: 5.9 10*3/uL (ref 1.7–7.7)
NEUTROS PCT: 82 % — AB (ref 43–77)
Platelets: 95 10*3/uL — ABNORMAL LOW (ref 150–400)
RBC: 5.15 MIL/uL (ref 4.22–5.81)
RDW: 13.4 % (ref 11.5–15.5)
WBC: 7.1 10*3/uL (ref 4.0–10.5)

## 2015-05-28 LAB — URINALYSIS, ROUTINE W REFLEX MICROSCOPIC
Bilirubin Urine: NEGATIVE
Glucose, UA: 100 mg/dL — AB
Hgb urine dipstick: NEGATIVE
Ketones, ur: NEGATIVE mg/dL
LEUKOCYTES UA: NEGATIVE
NITRITE: NEGATIVE
PROTEIN: NEGATIVE mg/dL
SPECIFIC GRAVITY, URINE: 1.022 (ref 1.005–1.030)
UROBILINOGEN UA: 1 mg/dL (ref 0.0–1.0)
pH: 5.5 (ref 5.0–8.0)

## 2015-05-28 LAB — CBC
HCT: 49.4 % (ref 39.0–52.0)
HEMOGLOBIN: 17.1 g/dL — AB (ref 13.0–17.0)
MCH: 32.1 pg (ref 26.0–34.0)
MCHC: 34.6 g/dL (ref 30.0–36.0)
MCV: 92.9 fL (ref 78.0–100.0)
Platelets: 101 10*3/uL — ABNORMAL LOW (ref 150–400)
RBC: 5.32 MIL/uL (ref 4.22–5.81)
RDW: 13.6 % (ref 11.5–15.5)
WBC: 6.4 10*3/uL (ref 4.0–10.5)

## 2015-05-28 LAB — GLUCOSE, CAPILLARY
Glucose-Capillary: 251 mg/dL — ABNORMAL HIGH (ref 65–99)
Glucose-Capillary: 383 mg/dL — ABNORMAL HIGH (ref 65–99)
Glucose-Capillary: 76 mg/dL (ref 65–99)

## 2015-05-28 LAB — TROPONIN I: Troponin I: <0.03 ng/mL (ref <0.031)

## 2015-05-28 LAB — CREATININE, SERUM
CREATININE: 1.38 mg/dL — AB (ref 0.61–1.24)
GFR, EST AFRICAN AMERICAN: 54 mL/min — AB (ref >60)
GFR, EST NON AFRICAN AMERICAN: 47 mL/min — AB (ref >60)

## 2015-05-28 MED ORDER — TAMSULOSIN HCL 0.4 MG PO CAPS
0.4000 mg | ORAL_CAPSULE | Freq: Every day | ORAL | Status: DC
  Administered 2015-05-29 – 2015-06-01 (×4): 0.4 mg via ORAL
  Filled 2015-05-28 (×4): qty 1

## 2015-05-28 MED ORDER — ENOXAPARIN SODIUM 40 MG/0.4ML ~~LOC~~ SOLN
40.0000 mg | SUBCUTANEOUS | Status: DC
  Administered 2015-05-28 – 2015-05-31 (×4): 40 mg via SUBCUTANEOUS
  Filled 2015-05-28 (×4): qty 0.4

## 2015-05-28 MED ORDER — HALOPERIDOL LACTATE 5 MG/ML IJ SOLN
1.0000 mg | Freq: Once | INTRAMUSCULAR | Status: AC
  Administered 2015-05-28: 1 mg via INTRAVENOUS
  Filled 2015-05-28: qty 1

## 2015-05-28 MED ORDER — HALOPERIDOL LACTATE 5 MG/ML IJ SOLN
1.0000 mg | Freq: Four times a day (QID) | INTRAMUSCULAR | Status: DC | PRN
  Administered 2015-05-28 – 2015-05-31 (×4): 1 mg via INTRAVENOUS
  Filled 2015-05-28 (×5): qty 1

## 2015-05-28 MED ORDER — ASPIRIN EC 81 MG PO TBEC
81.0000 mg | DELAYED_RELEASE_TABLET | Freq: Every day | ORAL | Status: DC
  Administered 2015-05-29 – 2015-06-01 (×4): 81 mg via ORAL
  Filled 2015-05-28 (×4): qty 1

## 2015-05-28 MED ORDER — ONDANSETRON HCL 4 MG/2ML IJ SOLN: 4.0000 mg | Freq: Four times a day (QID) | INTRAMUSCULAR | Status: DC | PRN

## 2015-05-28 MED ORDER — INSULIN ASPART 100 UNIT/ML ~~LOC~~ SOLN
0.0000 [IU] | Freq: Three times a day (TID) | SUBCUTANEOUS | Status: DC
  Administered 2015-05-28: 15 [IU] via SUBCUTANEOUS
  Administered 2015-05-30: 2 [IU] via SUBCUTANEOUS
  Administered 2015-05-30: 3 [IU] via SUBCUTANEOUS
  Administered 2015-05-31: 5 [IU] via SUBCUTANEOUS
  Administered 2015-05-31: 2 [IU] via SUBCUTANEOUS

## 2015-05-28 MED ORDER — ACETAMINOPHEN 650 MG RE SUPP: 650.0000 mg | Freq: Four times a day (QID) | RECTAL | Status: DC | PRN

## 2015-05-28 MED ORDER — INSULIN NPH (HUMAN) (ISOPHANE) 100 UNIT/ML ~~LOC~~ SUSP
30.0000 [IU] | Freq: Every day | SUBCUTANEOUS | Status: DC
  Filled 2015-05-28: qty 10

## 2015-05-28 MED ORDER — SODIUM CHLORIDE 0.9 % IV SOLN
INTRAVENOUS | Status: DC
  Administered 2015-05-28 – 2015-05-29 (×2): via INTRAVENOUS

## 2015-05-28 MED ORDER — ONDANSETRON HCL 4 MG PO TABS: 4.0000 mg | ORAL_TABLET | Freq: Four times a day (QID) | ORAL | Status: DC | PRN

## 2015-05-28 MED ORDER — INSULIN ASPART 100 UNIT/ML ~~LOC~~ SOLN: 0.0000 [IU] | Freq: Every day | SUBCUTANEOUS | Status: DC

## 2015-05-28 MED ORDER — ACETAMINOPHEN 325 MG PO TABS: 650.0000 mg | ORAL_TABLET | Freq: Four times a day (QID) | ORAL | Status: DC | PRN

## 2015-05-28 MED ORDER — METOPROLOL TARTRATE 50 MG PO TABS
50.0000 mg | ORAL_TABLET | Freq: Two times a day (BID) | ORAL | Status: DC
  Administered 2015-05-29 – 2015-05-31 (×5): 50 mg via ORAL
  Filled 2015-05-28 (×5): qty 1

## 2015-05-28 MED ORDER — INSULIN NPH (HUMAN) (ISOPHANE) 100 UNIT/ML ~~LOC~~ SUSP
60.0000 [IU] | Freq: Every day | SUBCUTANEOUS | Status: DC
  Administered 2015-05-29: 60 [IU] via SUBCUTANEOUS

## 2015-05-28 MED ORDER — ATORVASTATIN CALCIUM 40 MG PO TABS
40.0000 mg | ORAL_TABLET | Freq: Every day | ORAL | Status: DC
  Administered 2015-05-28 – 2015-06-01 (×5): 40 mg via ORAL
  Filled 2015-05-28 (×5): qty 1

## 2015-05-28 NOTE — ED Notes (Addendum)
During standing to urinate into urinal, patient monitor showed a couple of PVC's during exertion getting out of bed, I asked patient if he felt ok and he stated he was fine.  Showed to MD.

## 2015-05-28 NOTE — ED Notes (Signed)
Pt arrived POV with family, stating he was having an allergic reaction, cause unknown but family states he may have been exposed to fire ants.  Pt states he is itching.

## 2015-05-28 NOTE — ED Notes (Signed)
Attempt to call report x 1  

## 2015-05-28 NOTE — H&P (Addendum)
[facility] History and Physical  Joseph Mora:[id number] DOB: 1932/09/23 DOA: 05/28/2015  Referring physician:  PCP: Elby Showers, MD   Chief Complaint: Facial Swelling/altered mental status  HPI: Joseph Mora is a 81 y.o. male with a past medical history of insulin dependent diabetes mellitus, hypertension on ACE inhibitor therapy, who presented to the emergency room on 05/25/2015 with complaints of tongue swelling associated with significant pruritus. ER report mentioning he was bitten by fire ants prior to onset of symptoms. He was treated with epinephrine and was discharged on Benadryl and prednisone. Despite taking Benadryl and prednisone his wife reports that in the interim has developed progressive rash involving his trunk and arms and legs. This morning she reported patient having facial swelling as "he couldn't even open his eyes." His wife also reports patient having increased confusion, disoriented, having a functional decline. His wife confirming that he takes lisinopril daily.                                             Review of Systems:  Difficult to obtain a reliable review of systems, having acute encephalopathy and confusion  Past Medical History  Diagnosis Date  . CAD (coronary artery disease)   . Hyperlipidemia   . Diabetes mellitus    Past Surgical History  Procedure Laterality Date  . Carpal tunnel release    . Parathyroidectomy    . Eye surgery    . Inguinal hernia repair     Social History:  reports that he quit smoking about 4 years ago. His smokeless tobacco use includes Chew. He reports that he does not drink alcohol or use illicit drugs.  Allergies  Allergen Reactions  . Morphine And Related Itching  . Penicillins Hives    Family History  Problem Relation Age of Onset  . Heart attack Paternal Aunt   . Stroke Mother      Prior to Admission medications   Medication Sig Start Date End Date Taking? Authorizing Provider  aspirin 81  MG tablet Take 81 mg by mouth daily. 10/01/11  Yes Burnell Blanks, MD  atorvastatin (LIPITOR) 40 MG tablet TAKE 1 TABLET BY MOUTH DAILY 05/14/15  Yes Elby Showers, MD  diphenhydrAMINE (BENADRYL) 25 MG tablet Take 1 tablet (25 mg total) by mouth every 6 (six) hours. 05/25/15  Yes Mayer Camel, MD  doxazosin (CARDURA) 2 MG tablet Take 2 mg by mouth daily.   Yes Historical Provider, MD  EPINEPHrine (EPIPEN JR) 0.15 MG/0.3ML injection Inject 0.3 mLs (0.15 mg total) into the muscle once. 05/25/15  Yes Mayer Camel, MD  HUMULIN N 100 UNIT/ML injection INJECT 60 UNITS SUBCUTANEOUS BEFORE BREAKFAST AND INJECT 30 UNITS BEFORE DINNER 07/11/14  Yes Elby Showers, MD  Insulin Syringe-Needle U-100 (INSULIN SYRINGE 1CC/30GX5/16") 30G X 5/16" 1 ML MISC USE TWICE DAILY OR AS DIRECTED 03/16/15  Yes Elby Showers, MD  lisinopril (PRINIVIL,ZESTRIL) 20 MG tablet TAKE 1 TABLET BY MOUTH EVERY DAY 12/14/14  Yes Elby Showers, MD  metoprolol (LOPRESSOR) 50 MG tablet TAKE 1 TABLET BY MOUTH TWICE DAILY 12/14/14  Yes Elby Showers, MD  nitroGLYCERIN (NITROSTAT) 0.4 MG SL tablet Place 1 tablet (0.4 mg total) under the tongue every 5 (five) minutes as needed for chest pain. 03/31/14  Yes Burnell Blanks, MD  predniSONE (DELTASONE) 10 MG tablet Take 5 tablets (50  mg total) by mouth daily. 05/25/15  Yes Mayer Camel, MD  tamsulosin (FLOMAX) 0.4 MG CAPS capsule TAKE ONE CAPSULE BY MOUTH EVERY DAY 02/22/15  Yes Elby Showers, MD   Physical Exam: Filed Vitals:   05/28/15 1100 05/28/15 1115 05/28/15 1118 05/28/15 1130  BP: 129/63 122/65 122/65 127/68  Pulse: 83 84 87 84  Temp:      TempSrc:      Resp: 26 24 20 15   Height:      Weight:      SpO2: 96% 95% 99% 96%    Wt Readings from Last 3 Encounters:  05/28/15 86.183 kg (190 lb)  04/02/15 93.35 kg (205 lb 12.8 oz)  11/16/14 91.173 kg (201 lb)    General:  Appears calm and comfortable, he is in no acute distress awake and alert, pleasantly confused. Eyes: PERRL,  normal lids, irises & conjunctiva ENT: grossly normal hearing, lips & tongue, I did not notes tongue swelling. Neck: no LAD, masses or thyromegaly Cardiovascular: RRR, no m/r/g. No LE edema. Telemetry: SR, no arrhythmias  Respiratory: CTA bilaterally, no w/r/r. Normal respiratory effort. Abdomen: soft, ntnd Skin: He has macular rash over his trunk bilateral arms and upper portion of bilateral lower extremities. There are regions of welts.  Musculoskeletal: grossly normal tone BUE/BLE Psychiatric: Patient is pleasantly confused, having difficulty providing her accurate history Neurologic: grossly non-focal.          Labs on Admission:  Basic Metabolic Panel:  Recent Labs Lab 05/25/15 1940 05/25/15 2106 05/28/15 0920  NA 132*  --  134*  K 7.2* 4.3 4.2  CL 101  --  102  CO2 24  --  25  GLUCOSE 220*  --  228*  BUN 13  --  29*  CREATININE 1.26*  --  1.52*  CALCIUM 8.0*  --  8.1*   Liver Function Tests:  Recent Labs Lab 05/25/15 1940 05/28/15 0920  AST 65* 53*  ALT 36 30  ALKPHOS 64 46  BILITOT 1.9* 1.3*  PROT >12.0* 4.8*  ALBUMIN 3.2* 2.8*   No results for input(s): LIPASE, AMYLASE in the last 168 hours. No results for input(s): AMMONIA in the last 168 hours. CBC:  Recent Labs Lab 05/25/15 1940 05/28/15 0920  WBC 12.8* 7.1  NEUTROABS  --  5.9  HGB 17.9* 16.8  HCT 51.2 47.6  MCV 92.4 92.4  PLT 133* 95*   Cardiac Enzymes: No results for input(s): CKTOTAL, CKMB, CKMBINDEX, TROPONINI in the last 168 hours.  BNP (last 3 results) No results for input(s): BNP in the last 8760 hours.  ProBNP (last 3 results) No results for input(s): PROBNP in the last 8760 hours.  CBG: No results for input(s): GLUCAP in the last 168 hours.  Radiological Exams on Admission: No results found.  EKG: Independently reviewed.   Assessment/Plan Principal Problem:   Angioedema Active Problems:   Acute encephalopathy   AKI (acute kidney injury)   CAD (coronary artery  disease)   Hyperlipidemia   Insulin dependent diabetes mellitus   HTN (hypertension)   1. Possible Angioedema. Patient recently had emergency room visit for tongue swelling associate with generalized pruritus. Records indicating he was bitten by fire ants at the time symptoms started. His wife reporting this morning patient waking up with facial swelling and having difficulty opening his eyes along with confusion. She also reported worsening erythematous rash over trunk and bilateral extremities. He currently denies throat tightness, did not have tongue swelling on exam, though did have  areas of swelling with erythema involving trunk and extremities. He denies pruritis. I am concerned with the possibility of ACE inhibitor angioedema as he continues to take lisinopril 20 mg by mouth daily. His previous reaction characterized by pruritis unlikely to result from ACE induced angioedema, and probably was related to insect bites. With this said I think it would be prudent to discontinue ACE inhibitor. Will stop systemic steroids and Benadryl as well as these agents may be minimally effective in treating ACE inhibitor induced angioedema. Will admit to telemetry for close monitoring. Reassess need for steroid therapy in AM.  2. Acute encephalopathy. Patient having increased confusion over the past several days with associated functional decline. I wonder about the possibility of this reflecting anticholinergic effects from Benadryl. Steroids may also be a contributing factor to mental status changes, particularly in elderly patients.. Will check CT scan of brain. Will hold Benadryl and steroids for now and their role in treatment of this patient will need to be reassessed in am.   3. Insulin dependent diabetes mellitus. Presenting with elevated blood sugars with steroids likely contributing. Will place patient on sliding scale coverage before meals and at bedtime, continue home regimen with insulin NPH 60 units in  a.m. 30 units in p.m. 4. Hypertension. Given my concerns for ACE inhibitor induced angioedema will discontinue lisinopril, treat with metoprolol 50 mg by mouth twice a day 5. Acute kidney injury. Patient presenting with creatinine of 1.52 and BUN of 29, increased from creatinine of 1.26 and BUN of 13 on 05/25/2015. As mentioned above stopping ACE inhibitor therapy. Will provide IV fluids. Repeat BMP in a.m. 6. Generalized weakness. Will consult physical therapy 7. History of coronary artery disease. He does not appear to have acute cardiac issues at this time, will continue antiplatelets therapy, statin and beta blocker    Code Status: CODE STATUS discussed with patient he is a DO NOT RESUSCITATE Family Communication: Spoke with his wife was present at bedside Disposition Plan: Plan to place patient in overnight observation  Time spent: 23 minutes  Kelvin Cellar [facility] Pager [phone]

## 2015-05-28 NOTE — Evaluation (Addendum)
Physical Therapy Evaluation Patient Details Name: Joseph Mora MRN: [id number] DOB: September 16, 1933 Today's Date: 05/28/2015   History of Present Illness  81 y.o. male with a past medical history of insulin dependent diabetes mellitus, hypertension on ACE inhibitor therapy, who presented to the emergency room on 05/25/2015 with complaints of tongue swelling associated with significant pruritus. ER report mentioning he was bitten by fire ants prior to onset of symptoms. He was treated with epinephrine and was discharged on Benadryl and prednisone. Despite taking Benadryl and prednisone his wife reported that in the interim has developed progressive rash involving his trunk and arms and legs. She reported patient having facial swelling as "he couldn't even open his eyes," as well as increased confusion, disorientation, and a functional decline.  Clinical Impression  Patient in bed, agreeable to participate in PT today in order to get better. Patient was able to ambulate and transfer as described below. Initially, patient was pleasantly confused. However, patient became increasingly aggressive during ambulation, during which he was also increasingly unsafe and unsteady, to the point where patient was refusing help from PT while leaned against a wall, and yelled out for help as he thought he was being kidnapped from home. He had no awareness of existence of IV line, which he was constantly tangling himself up in. Nursing staff entered room, and was able to get patient seated in visitor chair with chair alarm although he was aggressive and disoriented with them as well. Security eventually entered room, and a combined effort got patient back into bed along with phone call from his daughter urging him to return to bed. Patient will benefit from continued PT as he cooperates to improve his balance, teach him proper use of cane and return him to independent ambulation with an assistive device.    Follow Up  Recommendations Supervision/Assistance - 24 hour;Home health PT    Equipment Recommendations  Rolling walker with 5" wheels    Recommendations for Other Services       Precautions / Restrictions Precautions Precautions: Fall Restrictions Weight Bearing Restrictions: No      Mobility  Bed Mobility Overal bed mobility: Modified Independent             General bed mobility comments: Very impulsive, no awarness of bed rails impeding getting out of bed or of IV line at all. Able to get out of bed with heavy use of bed rails.  Transfers Overall transfer level: Needs assistance Equipment used: Straight cane Transfers: Sit to/from Stand Sit to Stand: Min assist         General transfer comment: Patient able to stand without physical assistance but is immediately unsteady on his feet, requiring min A to maintain standing. Patient could not maintain standing, had to sit down quickly or start walking each time to maintain upright.  Ambulation/Gait Ambulation/Gait assistance: Min assist Ambulation Distance (Feet): 20 Feet Assistive device: Straight cane Gait Pattern/deviations: Shuffle;Narrow base of support;Staggering left;Staggering right;Drifts right/left;Decreased stride length   Gait velocity interpretation: Below normal speed for age/gender General Gait Details: Patient no demonstrable knowledge of how to use cane, no awareness of IV line attachment or needs for safety, however was continuously reaching for objects to hold onto, whether that be the IV pole or a chair or the wall. Is very impulsive with where he is walking to, very confused as to where he is walking to (started walking to window claiming he was going to go out on the porch). Became increasingly aggressive and refusing  physical assistance. When patient became aggressive, he stumbled back into the wall, tangled in his IV line, and called for help due to a belief he was being kidnapped.  Stairs             Wheelchair Mobility    Modified Rankin (Stroke Patients Only)       Balance Overall balance assessment: Needs assistance Sitting-balance support: Feet supported;Bilateral upper extremity supported Sitting balance-Leahy Scale: Fair     Standing balance support: Bilateral upper extremity supported Standing balance-Leahy Scale: Poor Standing balance comment: Cannot maintain static standing for more than 10 seconds                             Pertinent Vitals/Pain Pain Assessment: No/denies pain    Home Living Family/patient expects to be discharged to:: Private residence Living Arrangements: Spouse/significant other Available Help at Discharge: Family;Friend(s);Neighbor;Available 24 hours/day Type of Home: House Home Access: Stairs to enter Entrance Stairs-Rails: Left Entrance Stairs-Number of Steps: 13 Home Layout: One level Home Equipment: Cane - single point Additional Comments: Patient able to answer questions without hesitation or signs of confusion. 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 Confirm number of steps as mental status improves.    Prior Function Level of Independence: Independent with assistive device(s)               Hand Dominance   Dominant Hand: Right    Extremity/Trunk Assessment               Lower Extremity Assessment: Generalized weakness         Communication   Communication: No difficulties  Cognition Arousal/Alertness: Awake/alert Behavior During Therapy: Agitated;Impulsive Overall Cognitive Status: No family/caregiver present to determine baseline cognitive functioning Area of Impairment: Orientation;Memory;Following commands;Safety/judgement;Problem solving Orientation Level: Disoriented to;Place;Time;Situation   Memory: Decreased recall of precautions;Decreased short-term memory Following Commands: Follows one step commands  inconsistently Safety/Judgement: Decreased awareness of safety;Decreased awareness of deficits   Problem Solving: Difficulty sequencing General Comments: Patient originally pleasantly confused, completely disoriented to place as he at times thought he was at home, at the [facility], or at a motel. He was able to state that he was in the ER last night for his hives. However, upon ambulation, patient became increasingly aggressive and combative with PT, nursing staff, and eventually security.    General Comments General comments (skin integrity, edema, etc.): Patient thoroughly disoriented and confused, which led to aggression.    Exercises        Assessment/Plan    PT Assessment Patient needs continued PT services  PT Diagnosis Difficulty walking;Abnormality of gait;Generalized weakness   PT Problem List Decreased strength;Decreased activity tolerance;Decreased balance;Decreased mobility;Decreased cognition;Decreased knowledge of use of DME;Decreased safety awareness  PT Treatment Interventions DME instruction;Gait training;Stair training;Functional mobility training;Therapeutic activities;Therapeutic exercise;Balance training;Cognitive remediation;Patient/family education   PT Goals (Current goals can be found in the Care Plan section) Acute Rehab PT Goals Patient Stated Goal: Get out of here. PT Goal Formulation: With patient Time For Goal Achievement: 06/11/15 Potential to Achieve Goals: Fair (Depending on pt cooperation w/PT)    Frequency Min 3X/week   Barriers to discharge Inaccessible home environment Unsure of number of steps to enter house if patient report is correct.    Co-evaluation               End of Session Equipment Utilized During Treatment: Gait belt Activity  Tolerance: Treatment limited secondary to agitation Patient left: in bed;with bed alarm set;with nursing/sitter in room;Other (comment) (With security in room as well.) Nurse Communication: Mobility  status;Other (comment) (Aggression.)    Functional Assessment Tool Used: clinical judgment Functional Limitation: Mobility: Walking and moving around Mobility: Walking and Moving Around Current Status [phone]): At least 20 percent but less than 40 percent impaired, limited or restricted Mobility: Walking and Moving Around Goal Status [phone]): At least 1 percent but less than 20 percent impaired, limited or restricted    Time: 5248-1859 PT Time Calculation (min) (ACUTE ONLY): 41 min   Charges:   PT Evaluation $Initial PT Evaluation Tier I: 1 Procedure PT Treatments $Gait Training: 8-22 mins $Self Care/Home Management: 8-22   PT G Codes:   PT G-Codes **NOT FOR INPATIENT CLASS** Functional Assessment Tool Used: clinical judgment Functional Limitation: Mobility: Walking and moving around Mobility: Walking and Moving Around Current Status (M9311): At least 20 percent but less than 40 percent impaired, limited or restricted Mobility: Walking and Moving Around Goal Status [phone]): At least 1 percent but less than 20 percent impaired, limited or restricted    Roanna Epley, SPT [phone] 05/28/2015, 4:20 PM  I have read, reviewed and agree with student's note.   [place] [phone] (pager)

## 2015-05-28 NOTE — ED Provider Notes (Signed)
CSN: [id number]     Arrival date & time 05/28/15  6283 History   First MD Initiated Contact with Patient 05/28/15 0845     Chief Complaint  Patient presents with  . Facial Swelling     (Consider location/radiation/quality/duration/timing/severity/associated sxs/prior Treatment) HPI Comments: 81 year old male with history of coronary artery disease, prostate hypertrophy, diabetes, high blood pressure presents with worsening rash and infusion. Patient was seen on September 2 for rash which was likely attributed to fire and bites. Patient improved in the ER when home with an EpiPen.   Since then patient has had gradually worsening confusion and has been taking Benadryl and prednisone regularly. Last dose this morning. No focal deficits, confusion is mild and generalized per family. No fevers or chills. No head injuries. No neck stiffness. No stroke history or history of similar.  The history is provided by the patient and medical records.    Past Medical History  Diagnosis Date  . CAD (coronary artery disease)   . Hyperlipidemia   . Diabetes mellitus    Past Surgical History  Procedure Laterality Date  . Carpal tunnel release    . Parathyroidectomy    . Eye surgery    . Inguinal hernia repair     Family History  Problem Relation Age of Onset  . Heart attack Paternal Aunt   . Stroke Mother    Social History  Substance Use Topics  . Smoking status: Former Smoker    Quit date: 08/03/2010  . Smokeless tobacco: Current User    Types: Chew  . Alcohol Use: No    Review of Systems  Constitutional: Negative for fever and chills.  HENT: Negative for congestion.   Eyes: Negative for visual disturbance.  Respiratory: Negative for shortness of breath.   Cardiovascular: Negative for chest pain.  Gastrointestinal: Negative for vomiting and abdominal pain.  Genitourinary: Negative for dysuria and flank pain.  Musculoskeletal: Negative for back pain, neck pain and neck stiffness.  Skin:  Positive for rash.  Neurological: Negative for light-headedness and headaches.  Psychiatric/Behavioral: Positive for confusion.      Allergies  Morphine and related and Penicillins  Home Medications   Prior to Admission medications   Medication Sig Start Date End Date Taking? Authorizing Provider  aspirin 81 MG tablet Take 81 mg by mouth daily. 10/01/11  Yes Burnell Blanks, MD  atorvastatin (LIPITOR) 40 MG tablet TAKE 1 TABLET BY MOUTH DAILY 05/14/15  Yes Elby Showers, MD  diphenhydrAMINE (BENADRYL) 25 MG tablet Take 1 tablet (25 mg total) by mouth every 6 (six) hours. 05/25/15  Yes Mayer Camel, MD  doxazosin (CARDURA) 2 MG tablet Take 2 mg by mouth daily.   Yes Historical Provider, MD  EPINEPHrine (EPIPEN JR) 0.15 MG/0.3ML injection Inject 0.3 mLs (0.15 mg total) into the muscle once. 05/25/15  Yes Mayer Camel, MD  HUMULIN N 100 UNIT/ML injection INJECT 60 UNITS SUBCUTANEOUS BEFORE BREAKFAST AND INJECT 30 UNITS BEFORE DINNER 07/11/14  Yes Elby Showers, MD  Insulin Syringe-Needle U-100 (INSULIN SYRINGE 1CC/30GX5/16") 30G X 5/16" 1 ML MISC USE TWICE DAILY OR AS DIRECTED 03/16/15  Yes Elby Showers, MD  lisinopril (PRINIVIL,ZESTRIL) 20 MG tablet TAKE 1 TABLET BY MOUTH EVERY DAY 12/14/14  Yes Elby Showers, MD  metoprolol (LOPRESSOR) 50 MG tablet TAKE 1 TABLET BY MOUTH TWICE DAILY 12/14/14  Yes Elby Showers, MD  nitroGLYCERIN (NITROSTAT) 0.4 MG SL tablet Place 1 tablet (0.4 mg total) under the tongue every 5 (five) minutes  as needed for chest pain. 03/31/14  Yes Burnell Blanks, MD  predniSONE (DELTASONE) 10 MG tablet Take 5 tablets (50 mg total) by mouth daily. 05/25/15  Yes Mayer Camel, MD  tamsulosin (FLOMAX) 0.4 MG CAPS capsule TAKE ONE CAPSULE BY MOUTH EVERY DAY 02/22/15  Yes Elby Showers, MD   BP 122/65 mmHg  Pulse 87  Temp(Src) 97.7 F (36.5 C) (Oral)  Resp 20  Ht 5\' 5"  (1.651 m)  Wt 190 lb (86.183 kg)  BMI 31.62 kg/m2  SpO2 99% Physical Exam  Constitutional: He  appears well-developed and well-nourished.  HENT:  Head: Normocephalic and atraumatic.  Dry mucous membranes no angioedema appreciated neck supple no meningismus. Patient has mild periorbital swelling no swelling.  Eyes: Right eye exhibits no discharge. Left eye exhibits no discharge.  Neck: Normal range of motion. Neck supple. No tracheal deviation present.  Cardiovascular: Normal rate and regular rhythm.   Pulmonary/Chest: Effort normal and breath sounds normal.  Abdominal: Soft. He exhibits no distension. There is no tenderness. There is no guarding.  Musculoskeletal: He exhibits no edema.  Neurological: He is alert.  Patient moves all extremities equal bilateral no obvious arm or leg drift sensation intact grossly to palpation bilateral. Pupils equal at the muscle function is intact. Patient is alert however confused to random questioning. Patient has mild delay with response to certain questions. Finger nose intact with repeated questioning.  Skin: Skin is warm. Rash noted.  Patient has diffuse hive-like rash blanchable throughout entire body no rash on palms appreciated.  Psychiatric: He has a normal mood and affect.  Nursing note and vitals reviewed.   ED Course  Procedures (including critical care time) Labs Review Labs Reviewed  COMPREHENSIVE METABOLIC PANEL - Abnormal; Notable for the following:    Sodium 134 (*)    Glucose, Bld 228 (*)    BUN 29 (*)    Creatinine, Ser 1.52 (*)    Calcium 8.1 (*)    Total Protein 4.8 (*)    Albumin 2.8 (*)    AST 53 (*)    Total Bilirubin 1.3 (*)    GFR calc non Af Amer 42 (*)    GFR calc Af Amer 48 (*)    All other components within normal limits  CBC WITH DIFFERENTIAL/PLATELET - Abnormal; Notable for the following:    Platelets 95 (*)    Neutrophils Relative % 82 (*)    All other components within normal limits  URINALYSIS, ROUTINE W REFLEX MICROSCOPIC (NOT AT [facility]) - Abnormal; Notable for the following:    Color, Urine AMBER (*)     APPearance HAZY (*)    Glucose, UA 100 (*)    All other components within normal limits  URINE CULTURE    Imaging Review No results found. I have personally reviewed and evaluated these images and lab results as part of my medical decision-making.   EKG Interpretation None      MDM   Final diagnoses:  ACE inhibitor-aggravated angioedema, initial encounter  Hives  Confusion   Patient presents with worsening rash and confusion since taking prednisone and Benadryl. No focal neurologic findings. Blood work reviewed in the ER. Clinical concern for side effect from steroid-induced and Benadryl at this time. With age and worsening symptoms and confusion patient will need observation until symptoms and signs resolve or further workup is indicated.  Patient has no angioedema during my exam.   Discussed with hospitalist for admission plan to discontinue lisinopril, hold sterile and monitor  closely.  Any x-rays performed were independently reviewed by myself.   Differential diagnosis were considered with the presenting HPI.  Medications - No data to display  Filed Vitals:   05/28/15 1045 05/28/15 1100 05/28/15 1115 05/28/15 1118  BP: 121/71 129/63 122/65 122/65  Pulse: 33 83 84 87  Temp:      TempSrc:      Resp: 20 26 24 20   Height:      Weight:      SpO2: 100% 96% 95% 99%    Final diagnoses:  ACE inhibitor-aggravated angioedema, initial encounter  Hives  Confusion    Admission/ observation were discussed with the admitting physician, patient and/or family and they are comfortable with the plan.      Elnora Morrison, MD 05/28/15 [phone]

## 2015-05-29 ENCOUNTER — Observation Stay (HOSPITAL_COMMUNITY): Payer: Medicare Other

## 2015-05-29 DIAGNOSIS — I6789 Other cerebrovascular disease: Secondary | ICD-10-CM | POA: Diagnosis not present

## 2015-05-29 DIAGNOSIS — R220 Localized swelling, mass and lump, head: Secondary | ICD-10-CM | POA: Diagnosis not present

## 2015-05-29 DIAGNOSIS — N179 Acute kidney failure, unspecified: Secondary | ICD-10-CM | POA: Diagnosis not present

## 2015-05-29 DIAGNOSIS — G9340 Encephalopathy, unspecified: Secondary | ICD-10-CM

## 2015-05-29 DIAGNOSIS — E119 Type 2 diabetes mellitus without complications: Secondary | ICD-10-CM

## 2015-05-29 DIAGNOSIS — R7989 Other specified abnormal findings of blood chemistry: Secondary | ICD-10-CM

## 2015-05-29 DIAGNOSIS — I10 Essential (primary) hypertension: Secondary | ICD-10-CM | POA: Diagnosis not present

## 2015-05-29 DIAGNOSIS — Z794 Long term (current) use of insulin: Secondary | ICD-10-CM | POA: Diagnosis not present

## 2015-05-29 DIAGNOSIS — T783XXA Angioneurotic edema, initial encounter: Secondary | ICD-10-CM | POA: Diagnosis not present

## 2015-05-29 DIAGNOSIS — F05 Delirium due to known physiological condition: Secondary | ICD-10-CM | POA: Diagnosis not present

## 2015-05-29 LAB — BASIC METABOLIC PANEL
ANION GAP: 7 (ref 5–15)
BUN: 22 mg/dL — AB (ref 6–20)
CALCIUM: 7.8 mg/dL — AB (ref 8.9–10.3)
CO2: 25 mmol/L (ref 22–32)
Chloride: 107 mmol/L (ref 101–111)
Creatinine, Ser: 1.15 mg/dL (ref 0.61–1.24)
GFR calc Af Amer: >60 mL/min (ref >60)
GFR, EST NON AFRICAN AMERICAN: 58 mL/min — AB (ref >60)
GLUCOSE: 107 mg/dL — AB (ref 65–99)
Potassium: 4 mmol/L (ref 3.5–5.1)
Sodium: 139 mmol/L (ref 135–145)

## 2015-05-29 LAB — GLUCOSE, CAPILLARY
GLUCOSE-CAPILLARY: 88 mg/dL (ref 65–99)
GLUCOSE-CAPILLARY: 114 mg/dL — AB (ref 65–99)
Glucose-Capillary: 59 mg/dL — ABNORMAL LOW (ref 65–99)
Glucose-Capillary: 105 mg/dL — ABNORMAL HIGH (ref 65–99)
Glucose-Capillary: 124 mg/dL — ABNORMAL HIGH (ref 65–99)
Glucose-Capillary: 48 mg/dL — ABNORMAL LOW (ref 65–99)

## 2015-05-29 LAB — URINE CULTURE

## 2015-05-29 LAB — CBC
HEMATOCRIT: 43.5 % (ref 39.0–52.0)
HEMOGLOBIN: 14.7 g/dL (ref 13.0–17.0)
MCH: 31.7 pg (ref 26.0–34.0)
MCHC: 33.8 g/dL (ref 30.0–36.0)
MCV: 94 fL (ref 78.0–100.0)
Platelets: 105 10*3/uL — ABNORMAL LOW (ref 150–400)
RBC: 4.63 MIL/uL (ref 4.22–5.81)
RDW: 13.7 % (ref 11.5–15.5)
WBC: 5.6 10*3/uL (ref 4.0–10.5)

## 2015-05-29 LAB — CBG MONITORING, ED: Glucose-Capillary: 234 mg/dL — ABNORMAL HIGH (ref 65–99)

## 2015-05-29 LAB — TROPONIN I

## 2015-05-29 MED ORDER — INSULIN NPH (HUMAN) (ISOPHANE) 100 UNIT/ML ~~LOC~~ SUSP
15.0000 [IU] | Freq: Once | SUBCUTANEOUS | Status: AC
Start: 2015-05-29 — End: 2015-05-29
  Administered 2015-05-29: 15 [IU] via SUBCUTANEOUS
  Filled 2015-05-29 (×2): qty 10

## 2015-05-29 MED ORDER — DIPHENHYDRAMINE HCL 50 MG/ML IJ SOLN
12.5000 mg | Freq: Three times a day (TID) | INTRAMUSCULAR | Status: DC | PRN
  Administered 2015-05-29 – 2015-05-30 (×2): 12.5 mg via INTRAVENOUS
  Filled 2015-05-29 (×2): qty 1

## 2015-05-29 MED ORDER — FAMOTIDINE 20 MG PO TABS
20.0000 mg | ORAL_TABLET | Freq: Two times a day (BID) | ORAL | Status: DC
  Administered 2015-05-29 – 2015-06-01 (×6): 20 mg via ORAL
  Filled 2015-05-29 (×6): qty 1

## 2015-05-29 NOTE — Progress Notes (Signed)
Hypoglycemic Event  CBG: 48  Treatment: 15 GM carbohydrate snack  Symptoms: Shaky  Follow-up CBG: Time:2220 CBG Result:124  Possible Reasons for Event: Inadequate meal intake  Comments/MD notified: K.Schorr,NP    Corena Pilgrim C  Remember to initiate Hypoglycemia Order Set & complete

## 2015-05-29 NOTE — Progress Notes (Addendum)
Patient combative, do not want to be touched, wants to stand up and scratches his sides and hands d/t rashes.  This nurse applied moisturizer all over his body and arms.  Another RN administered PRN haldol 1 mg Inj at 1923 and noted effective around 2000.  Phlebotomist was not able to draw blood from patient d/t combative behavior.

## 2015-05-29 NOTE — Progress Notes (Signed)
Physical Therapy Treatment Patient Details Name: Joseph Mora MRN: [id number] DOB: 1933-02-22 Today's Date: 05/29/2015    History of Present Illness 81 y.o. male with a past medical history of insulin dependent diabetes mellitus, hypertension on ACE inhibitor therapy, who presented to the emergency room on 05/25/2015 with complaints of tongue swelling associated with significant pruritus. ER report mentioning he was bitten by fire ants prior to onset of symptoms. He was treated with epinephrine and was discharged on Benadryl and prednisone. Despite taking Benadryl and prednisone his wife reported that in the interim has developed progressive rash involving his trunk and arms and legs. She reported patient having facial swelling as "he couldn't even open his eyes," as well as increased confusion, disorientation, and a functional decline.    PT Comments    Patient progressing better towards PT goals. No aggression or agitation today. Pt unsteady on feet and mildly impulsive requiring Min A for balance/safety. Continues to be confused. Session limited as pt going down for CT scan. Will continue to follow acutely per current POC.   Follow Up Recommendations  Supervision/Assistance - 24 hour;Home health PT     Equipment Recommendations  Rolling walker with 5" wheels    Recommendations for Other Services       Precautions / Restrictions Precautions Precautions: Fall Restrictions Weight Bearing Restrictions: No    Mobility  Bed Mobility Overal bed mobility: Modified Independent                Transfers Overall transfer level: Needs assistance Equipment used: Rolling walker (2 wheeled) Transfers: Sit to/from Stand Sit to Stand: Min assist         General transfer comment: Min A to boost from EOB. Despite cues for hand placement, pt pulling up on RW.   Ambulation/Gait Ambulation/Gait assistance: Min assist Ambulation Distance (Feet): 120 Feet Assistive device: Rolling  walker (2 wheeled) Gait Pattern/deviations: Step-through pattern;Decreased stride length;Wide base of support;Drifts right/left   Gait velocity interpretation: Below normal speed for age/gender General Gait Details: Cues for RW management/proximity and safety. 1 standing rest break due to fatigue. "my legs are going to give out."  Min A for balance/safety.    Stairs            Wheelchair Mobility    Modified Rankin (Stroke Patients Only)       Balance Overall balance assessment: Needs assistance Sitting-balance support: Feet supported;No upper extremity supported Sitting balance-Leahy Scale: Good     Standing balance support: During functional activity Standing balance-Leahy Scale: Poor                      Cognition Arousal/Alertness: Awake/alert Behavior During Therapy: WFL for tasks assessed/performed Overall Cognitive Status: Impaired/Different from baseline Area of Impairment: Orientation;Safety/judgement;Problem solving Orientation Level: Disoriented to;Time (Knows he is going down for a CT scan this AM.)     Following Commands: Follows multi-step commands with increased time Safety/Judgement: Decreased awareness of safety;Decreased awareness of deficits   Problem Solving: Requires verbal cues General Comments: Pt with difficulty finding way to exit room, running into closed door. Able to state it was Labor day but states this is November.    Exercises General Exercises - Lower Extremity Ankle Circles/Pumps: Both;15 reps;Seated Long Arc Quad: Both;15 reps;Seated    General Comments General comments (skin integrity, edema, etc.): Family present in room during session.      Pertinent Vitals/Pain Pain Assessment: No/denies pain    Home Living  Prior Function            PT Goals (current goals can now be found in the care plan section) Progress towards PT goals: Progressing toward goals    Frequency  Min  3X/week    PT Plan Current plan remains appropriate    Co-evaluation             End of Session Equipment Utilized During Treatment: Gait belt Activity Tolerance: Patient tolerated treatment well Patient left: in bed;with call bell/phone within reach;with family/visitor present;with bed alarm set;Other (comment) (with transport in room getting ready to take pt down for CT)     Time: 1478-2956 PT Time Calculation (min) (ACUTE ONLY): 15 min  Charges:  $Gait Training: 8-22 mins                    G Codes:      [place] 05/29/2015, 10:04 AM  Wray Kearns, PT, DPT [phone]

## 2015-05-29 NOTE — Progress Notes (Signed)
PROGRESS NOTE  Joseph Mora [id number] DOB: 1933-02-19 DOA: 05/28/2015 PCP: Elby Showers, MD  HPI/Recap of past 24 hours:  Laying in bed , reported feeling better, still slight confusion, but oriented x3, denies dysphagia or sob. Wanting to eat.  Assessment/Plan: Principal Problem:   Angioedema Active Problems:   CAD (coronary artery disease)   Hyperlipidemia   Insulin dependent diabetes mellitus   HTN (hypertension)   Acute encephalopathy   AKI (acute kidney injury)  Angioedema: patient reported his tongue was" thick" with facial swelling prior to being admitted to the hospital. Now better, he received treatment with steroids/benadryl/epipen, continue pepcid here. Better. D/c lisinopril.  Confusion: patient reported he has noticed memory impairment recently, ct head no acute findings. Cbc/cmp with mild bun/cr /lft elevation. Will check tsh/b12/thiamine/ammonia level.  Acute kidney injury: improved with ivf, now has lower extremity edema, d/c ivf. ua no infection. Close monitor renal function.  Mild elevation of lft: unclear etiology, will check ab [place], acute hepatitis panel. No n/v, no ab pain, wanting to eat. Denies h/o alcohol.  Bilateral lower extremity edema: no echo in chart, echo ordered, close monitor volume status.  IDDM2, recent a1c 8.2 in 04/2015, continue insulin, adjust prn  HTN; continue lopressor, d/c acei, bp stable.  H/o CAD s/p stent, denies chest pain, continue asa/statin/betablocker  Frequent ectopic beats: keep k>4, mag >2, check tsh, continue lopressor, keep on tele.  Bph: d/c cardura, change to flomax due to low normal bp.  FTT/ generalized weakess/confusion: pt eval, need home health    Code Status: full  Family Communication: patient   Disposition Plan: home with home health when medically stable   Consultants:  none  Procedures:  none  Antibiotics:  none   Objective: BP 113/47 mmHg  Pulse 92  Temp(Src) 98.5 F (36.9  C) (Oral)  Resp 18  Ht 5\' 4"  (1.626 m)  Wt 203 lb 7.8 oz (92.3 kg)  BMI 34.91 kg/m2  SpO2 99%  Intake/Output Summary (Last 24 hours) at 05/29/15 1616 Last data filed at 05/29/15 1504  Gross per 24 hour  Intake   2044 ml  Output      0 ml  Net   2044 ml   Filed Weights   05/28/15 0854 05/28/15 1301  Weight: 190 lb (86.183 kg) 203 lb 7.8 oz (92.3 kg)    Exam:   General:  NAD  Cardiovascular: RRR with frequent ectopic beats and +murmur  Respiratory: CTABL  Abdomen: Soft/ND/NT, positive BS  Musculoskeletal: 1-2+pitting  Edema bilateral lower extremity  Neuro: aaox3, poor immediate recall 2/3, mild confusion about the situation.  Data Reviewed: Basic Metabolic Panel:  Recent Labs Lab 05/25/15 1940 05/25/15 2106 05/28/15 0920 05/28/15 1348 05/29/15 0350  NA 132*  --  134*  --  139  K 7.2* 4.3 4.2  --  4.0  CL 101  --  102  --  107  CO2 24  --  25  --  25  GLUCOSE 220*  --  228*  --  107*  BUN 13  --  29*  --  22*  CREATININE 1.26*  --  1.52* 1.38* 1.15  CALCIUM 8.0*  --  8.1*  --  7.8*   Liver Function Tests:  Recent Labs Lab 05/25/15 1940 05/28/15 0920  AST 65* 53*  ALT 36 30  ALKPHOS 64 46  BILITOT 1.9* 1.3*  PROT >12.0* 4.8*  ALBUMIN 3.2* 2.8*   No results for input(s): LIPASE, AMYLASE in the last 168  hours. No results for input(s): AMMONIA in the last 168 hours. CBC:  Recent Labs Lab 05/25/15 1940 05/28/15 0920 05/28/15 1348 05/29/15 0350  WBC 12.8* 7.1 6.4 5.6  NEUTROABS  --  5.9  --   --   HGB 17.9* 16.8 17.1* 14.7  HCT 51.2 47.6 49.4 43.5  MCV 92.4 92.4 92.9 94.0  PLT 133* 95* 101* 105*   Cardiac Enzymes:    Recent Labs Lab 05/28/15 1348 05/28/15 2100 05/29/15 0350  TROPONINI <0.03 <0.03 <0.03   BNP (last 3 results) No results for input(s): BNP in the last 8760 hours.  ProBNP (last 3 results) No results for input(s): PROBNP in the last 8760 hours.  CBG:  Recent Labs Lab 05/28/15 1305 05/28/15 1743 05/28/15 2301  05/29/15 0814 05/29/15 1137  GLUCAP 251* 383* 76 88 114*    Recent Results (from the past 240 hour(s))  Urine culture     Status: None   Collection Time: 05/28/15  9:34 AM  Result Value Ref Range Status   Specimen Description URINE, CLEAN CATCH  Final   Special Requests NONE  Final   Culture MULTIPLE SPECIES PRESENT, SUGGEST RECOLLECTION  Final   Report Status 05/29/2015 FINAL  Final     Studies: Ct Head Wo Contrast  05/29/2015   CLINICAL DATA:  81 year old who presented yesterday with encephalopathy, acute mental status changes, and was combative. The encephalopathy is felt to be secondary to an acute allergic reaction to fire ant bites, as he presented with tongue swelling and facial swelling as well. Patient much improved today. Current history of hypertension and diabetes.  EXAM: CT HEAD WITHOUT CONTRAST  TECHNIQUE: Contiguous axial images were obtained from the base of the skull through the vertex without intravenous contrast.  COMPARISON:  None.  FINDINGS: Moderate cortical atrophy. Mild deep atrophy. No visible cerebellar atrophy. Moderate changes of small vessel disease of the white matter diffusely, including the pons and midbrain. No mass lesion. No midline shift. No acute hemorrhage or hematoma. No extra-axial fluid collections. No evidence of acute infarction.  No skull fracture or other focal osseous abnormality involving the skull. Old healed fracture involving the medial wall of the left orbit. Visualized paranasal sinuses, bilateral mastoid air cells and bilateral middle ear cavities well-aerated. Moderate to severe bilateral carotid siphon atherosclerosis. Note made of calcified pannus posterior to the visualized dense.  IMPRESSION: 1. No acute intracranial abnormality. 2. Mild to moderate generalized atrophy and moderate chronic microvascular ischemic changes of the white matter.   Electronically Signed   By: Evangeline Dakin M.D.   On: 05/29/2015 10:33    Scheduled Meds: .  aspirin EC  81 mg Oral Daily  . atorvastatin  40 mg Oral Daily  . enoxaparin (LOVENOX) injection  40 mg Subcutaneous Q24H  . famotidine  20 mg Oral BID  . insulin aspart  0-15 Units Subcutaneous TID WC  . insulin aspart  0-5 Units Subcutaneous QHS  . insulin NPH Human  30 Units Subcutaneous QHS  . insulin NPH Human  60 Units Subcutaneous QAC breakfast  . metoprolol  50 mg Oral BID  . tamsulosin  0.4 mg Oral Daily    Continuous Infusions:    Time spent: 55mins  Ellana Kawa MD, PhD  [facility] Pager [phone]. If 7PM-7AM, please contact night-coverage at [email or web address], password [facility] 05/29/2015, 4:16 PM

## 2015-05-29 NOTE — Progress Notes (Addendum)
Patient asleep around 2030 and then this nurse called phlebotomist to draw blood for troponin level per order.  CBG was then taken by CNA at 2301 (CBG=76).  Patient woke up, calm and cooperative around 0030 to have a BM using BSC. Patient drank a cup of orange juice.  This nurse was able to place telemetry monitor after the BM and patient went back to bed to sleep.  Family member at bedside.  Will closely monitor patient.

## 2015-05-29 NOTE — Progress Notes (Signed)
This nurse applied moisturizer to patient's body d/t c/o itching and patient felt relieved.  MD earlier ordered to hold any Benadryl per MD's notes as this may be minimally effective in treating ACE inhibitor induced edema.

## 2015-05-29 NOTE — Care Management (Signed)
Received order for home health PT . Patient confused called wife Joseph Mora , no answer and voice mail. Patient not discharging today , will attempt again tomorrow. Magdalen Spatz RN BSN [phone]

## 2015-05-30 ENCOUNTER — Observation Stay (HOSPITAL_BASED_OUTPATIENT_CLINIC_OR_DEPARTMENT_OTHER): Payer: Medicare Other

## 2015-05-30 ENCOUNTER — Observation Stay (HOSPITAL_COMMUNITY): Payer: Medicare Other

## 2015-05-30 DIAGNOSIS — E119 Type 2 diabetes mellitus without complications: Secondary | ICD-10-CM | POA: Diagnosis not present

## 2015-05-30 DIAGNOSIS — N179 Acute kidney failure, unspecified: Secondary | ICD-10-CM | POA: Diagnosis not present

## 2015-05-30 DIAGNOSIS — G9340 Encephalopathy, unspecified: Secondary | ICD-10-CM | POA: Diagnosis not present

## 2015-05-30 DIAGNOSIS — I509 Heart failure, unspecified: Secondary | ICD-10-CM

## 2015-05-30 DIAGNOSIS — R945 Abnormal results of liver function studies: Secondary | ICD-10-CM | POA: Diagnosis not present

## 2015-05-30 DIAGNOSIS — T783XXA Angioneurotic edema, initial encounter: Secondary | ICD-10-CM | POA: Diagnosis not present

## 2015-05-30 DIAGNOSIS — Z794 Long term (current) use of insulin: Secondary | ICD-10-CM | POA: Diagnosis not present

## 2015-05-30 LAB — COMPREHENSIVE METABOLIC PANEL
ALT: 28 U/L (ref 17–63)
ANION GAP: 7 (ref 5–15)
AST: 26 U/L (ref 15–41)
Albumin: 2.5 g/dL — ABNORMAL LOW (ref 3.5–5.0)
Alkaline Phosphatase: 41 U/L (ref 38–126)
BUN: 13 mg/dL (ref 6–20)
CALCIUM: 7.8 mg/dL — AB (ref 8.9–10.3)
CHLORIDE: 104 mmol/L (ref 101–111)
CO2: 28 mmol/L (ref 22–32)
Creatinine, Ser: 1.01 mg/dL (ref 0.61–1.24)
Glucose, Bld: 58 mg/dL — ABNORMAL LOW (ref 65–99)
Potassium: 3.4 mmol/L — ABNORMAL LOW (ref 3.5–5.1)
SODIUM: 139 mmol/L (ref 135–145)
Total Bilirubin: 0.8 mg/dL (ref 0.3–1.2)
Total Protein: 4.7 g/dL — ABNORMAL LOW (ref 6.5–8.1)

## 2015-05-30 LAB — SEDIMENTATION RATE: SED RATE: 6 mm/h (ref 0–16)

## 2015-05-30 LAB — GLUCOSE, CAPILLARY
GLUCOSE-CAPILLARY: 75 mg/dL (ref 65–99)
GLUCOSE-CAPILLARY: 137 mg/dL — AB (ref 65–99)
GLUCOSE-CAPILLARY: 160 mg/dL — AB (ref 65–99)
Glucose-Capillary: 57 mg/dL — ABNORMAL LOW (ref 65–99)
Glucose-Capillary: 110 mg/dL — ABNORMAL HIGH (ref 65–99)
Glucose-Capillary: 86 mg/dL (ref 65–99)

## 2015-05-30 LAB — TSH: TSH: 2.073 u[IU]/mL (ref 0.350–4.500)

## 2015-05-30 LAB — C-REACTIVE PROTEIN: CRP: 5 mg/dL — ABNORMAL HIGH (ref <1.0)

## 2015-05-30 LAB — AMMONIA: AMMONIA: 22 umol/L (ref 9–35)

## 2015-05-30 LAB — MAGNESIUM: MAGNESIUM: 2 mg/dL (ref 1.7–2.4)

## 2015-05-30 LAB — VITAMIN B12: VITAMIN B 12: 216 pg/mL (ref 180–914)

## 2015-05-30 MED ORDER — DEXTROSE 50 % IV SOLN
25.0000 mL | Freq: Once | INTRAVENOUS | Status: AC
  Administered 2015-05-30: 25 mL via INTRAVENOUS

## 2015-05-30 MED ORDER — DEXTROSE 5 % IV SOLN
600.0000 mg | Freq: Three times a day (TID) | INTRAVENOUS | Status: DC
  Administered 2015-05-31 – 2015-06-01 (×4): 600 mg via INTRAVENOUS
  Filled 2015-05-30 (×8): qty 12

## 2015-05-30 MED ORDER — INSULIN NPH (HUMAN) (ISOPHANE) 100 UNIT/ML ~~LOC~~ SUSP
15.0000 [IU] | Freq: Every day | SUBCUTANEOUS | Status: DC
  Administered 2015-05-30 – 2015-05-31 (×2): 15 [IU] via SUBCUTANEOUS
  Filled 2015-05-30: qty 10

## 2015-05-30 MED ORDER — DEXTROSE 50 % IV SOLN
INTRAVENOUS | Status: AC
  Filled 2015-05-30: qty 50

## 2015-05-30 MED ORDER — INSULIN NPH (HUMAN) (ISOPHANE) 100 UNIT/ML ~~LOC~~ SUSP
40.0000 [IU] | Freq: Every day | SUBCUTANEOUS | Status: DC
  Administered 2015-05-31: 40 [IU] via SUBCUTANEOUS

## 2015-05-30 MED ORDER — POTASSIUM CHLORIDE CRYS ER 20 MEQ PO TBCR
40.0000 meq | EXTENDED_RELEASE_TABLET | Freq: Once | ORAL | Status: AC
  Administered 2015-05-30: 40 meq via ORAL
  Filled 2015-05-30: qty 2

## 2015-05-30 MED ORDER — DEXTROSE 5 % IV SOLN
10.0000 mg/kg | Freq: Three times a day (TID) | INTRAVENOUS | Status: DC
  Administered 2015-05-30 (×2): 925 mg via INTRAVENOUS
  Filled 2015-05-30 (×4): qty 18.5

## 2015-05-30 MED ORDER — BLISTEX MEDICATED EX OINT
TOPICAL_OINTMENT | CUTANEOUS | Status: DC | PRN
  Filled 2015-05-30: qty 10

## 2015-05-30 MED ORDER — VITAMIN B-12 100 MCG PO TABS
100.0000 ug | ORAL_TABLET | Freq: Every day | ORAL | Status: DC
  Administered 2015-05-30 – 2015-06-01 (×3): 100 ug via ORAL
  Filled 2015-05-30 (×4): qty 1

## 2015-05-30 NOTE — Care Management (Signed)
Attempted to call patient's wife again , no answer , no voice mail. No family presently at bedside. Magdalen Spatz RN BSN

## 2015-05-30 NOTE — Care Management Note (Signed)
Case Management Note  Patient Details  Name: JAKIE DEBOW MRN: [id number] Date of Birth: 09/18/1933  Subjective/Objective:                    Action/Plan:  Wife at bedside. Face sheet information confirmed . No secondary number .  Expected Discharge Date:                  Expected Discharge Plan:  [place]  In-House Referral:     Discharge planning Services  CM Consult  Post Acute Care Choice:  Home Health Choice offered to:  Spouse  DME Arranged:  [place] [facility] DME Agency:  [facility] Arranged:  PT [place] Agency:  [facility]  Status of Service:  Completed, signed off  Medicare Important Message Given:    Date Medicare IM Given:    Medicare IM give by:    Date Additional Medicare IM Given:    Additional Medicare Important Message give by:     If discussed at [place] of Stay Meetings, dates discussed:    Additional Comments:  Marilu Favre, RN 05/30/2015, 10:15 AM

## 2015-05-30 NOTE — Progress Notes (Signed)
PROGRESS NOTE  Joseph Mora [id number] DOB: September 25, 1932 DOA: 05/28/2015 PCP: Elby Showers, MD  HPI/Recap of past 24 hours:  Remain confused, know the year but not the month. Reported lip sore for a few weeks, denies headache, no fever. Denies history of herpes. Wanting to eat.  Assessment/Plan: Principal Problem:   Angioedema Active Problems:   CAD (coronary artery disease)   Hyperlipidemia   Insulin dependent diabetes mellitus   HTN (hypertension)   Acute encephalopathy   AKI (acute kidney injury)  Angioedema?: patient reported his tongue was" thick" with facial swelling prior to being admitted to the hospital. Now better, he received treatment with steroids/benadryl/epipen, continue pepcid here. Better. D/c lisinopril.  Confusion: patient reported he has noticed memory impairment recently, ct head no acute findings. Cbc/cmp with mild bun/cr /lft elevation. tsh wnl, low normal b12/thiamine pending/ammonia wnl. HIV/rpr pending.  Lip blister/pain, no oral lesion, no head ache, check hsv titer, start topical treatment and acyclovir iv, monitor mental status and renal function.  Acute kidney injury: improved with ivf, now has lower extremity edema, d/c ivf. ua no infection. Close monitor renal function. Cr normalized on 9/7  Mild elevation of lft: unclear etiology,  ab [place] poor quality, lft return to normal on 9/7, acute hepatitis panel pending. No n/v, no ab pain, wanting to eat. Denies h/o alcohol.   Bilateral lower extremity edema: no echo in chart, echo ordered, close monitor volume status. Less edema on 9/7.  IDDM2, recent a1c 8.2 in 04/2015, continue insulin, adjust prn, had hypoglycemia episode on 9/6, insulin dose decreased  HTN; continue lopressor, d/c acei, bp stable.  H/o CAD s/p stent, denies chest pain, continue asa/statin/betablocker  Frequent ectopic beats: keep k>4, mag >2, check tsh, continue lopressor, keep on tele.  Bph: d/c cardura, change to flomax due  to low normal bp.  FTT/ generalized weakess/confusion: pt eval, need home health    Code Status: full  Family Communication: patient   Disposition Plan: home with home health when medically stable, in 1-2 days   Consultants:  none  Procedures:  none  Antibiotics:  none   Objective: BP 133/63 mmHg  Pulse 86  Temp(Src) 99 F (37.2 C) (Oral)  Resp 19  Ht 5\' 4"  (1.626 m)  Wt 203 lb 7.8 oz (92.3 kg)  BMI 34.91 kg/m2  SpO2 100%  Intake/Output Summary (Last 24 hours) at 05/30/15 1253 Last data filed at 05/30/15 0847  Gross per 24 hour  Intake   1295 ml  Output    550 ml  Net    745 ml   Filed Weights   05/28/15 0854 05/28/15 1301  Weight: 190 lb (86.183 kg) 203 lb 7.8 oz (92.3 kg)    Exam:   General:  NAD  Cardiovascular: RRR with frequent ectopic beats and +murmur  Respiratory: CTABL  Abdomen: Soft/ND/NT, positive BS  Musculoskeletal: 1-2+pitting  Edema bilateral lower extremity, subsiding  Neuro: not able to state the correct month, remain slightly confused.  Data Reviewed: Basic Metabolic Panel:  Recent Labs Lab 05/25/15 1940 05/25/15 2106 05/28/15 0920 05/28/15 1348 05/29/15 0350 05/30/15 0420  NA 132*  --  134*  --  139 139  K 7.2* 4.3 4.2  --  4.0 3.4*  CL 101  --  102  --  107 104  CO2 24  --  25  --  25 28  GLUCOSE 220*  --  228*  --  107* 58*  BUN 13  --  29*  --  22* 13  CREATININE 1.26*  --  1.52* 1.38* 1.15 1.01  CALCIUM 8.0*  --  8.1*  --  7.8* 7.8*  MG  --   --   --   --   --  2.0   Liver Function Tests:  Recent Labs Lab 05/25/15 1940 05/28/15 0920 06/10/2015 0420  AST 65* 53* 26  ALT 36 30 28  ALKPHOS 64 46 41  BILITOT 1.9* 1.3* 0.8  PROT >12.0* 4.8* 4.7*  ALBUMIN 3.2* 2.8* 2.5*   No results for input(s): LIPASE, AMYLASE in the last 168 hours.  Recent Labs Lab June 10, 2015 0420  AMMONIA 22   CBC:  Recent Labs Lab 05/25/15 1940 05/28/15 0920 05/28/15 1348 05/29/15 0350  WBC 12.8* 7.1 6.4 5.6  NEUTROABS   --  5.9  --   --   HGB 17.9* 16.8 17.1* 14.7  HCT 51.2 47.6 49.4 43.5  MCV 92.4 92.4 92.9 94.0  PLT 133* 95* 101* 105*   Cardiac Enzymes:    Recent Labs Lab 05/28/15 1348 05/28/15 2100 05/29/15 0350  TROPONINI <0.03 <0.03 <0.03   BNP (last 3 results) No results for input(s): BNP in the last 8760 hours.  ProBNP (last 3 results) No results for input(s): PROBNP in the last 8760 hours.  CBG:  Recent Labs Lab 05/29/15 2220 June 10, 2015 0608 2015/06/10 0640 2015-06-10 0747 10-Jun-2015 1146  GLUCAP 124* 57* 110* 75 137*    Recent Results (from the past 240 hour(s))  Urine culture     Status: None   Collection Time: 05/28/15  9:34 AM  Result Value Ref Range Status   Specimen Description URINE, CLEAN CATCH  Final   Special Requests NONE  Final   Culture MULTIPLE SPECIES PRESENT, SUGGEST RECOLLECTION  Final   Report Status 05/29/2015 FINAL  Final     Studies: US Abdomen Limited Ruq  06/10/2015   CLINICAL DATA:  Elevated liver function studies, history of diabetes and hypertension  EXAM: US ABDOMEN LIMITED - RIGHT UPPER QUADRANT  COMPARISON:  None in PACs  FINDINGS: Gallbladder:  The gallbladder is adequately distended with no evidence of stones, wall thickening, or pericholecystic fluid. There is no positive sonographic Murphy's sign.  Common bile duct:  Diameter: 5 mm  Liver:  Evaluation of the left hepatic lobe is limited due to bowel gas. Otherwise the liver is unremarkable.  IMPRESSION: 1. No gallstones or other acute gallbladder pathology. 2. Evaluation of the the liver is limited. Further evaluation with hepatic protocol CT scanning or MRI is recommended given the abnormal liver function studies.   Electronically Signed   By: David  Martinique M.D.   On: 06/10/2015 09:30    Scheduled Meds: . acyclovir  10 mg/kg Intravenous 3 times per day  . aspirin EC  81 mg Oral Daily  . atorvastatin  40 mg Oral Daily  . enoxaparin (LOVENOX) injection  40 mg Subcutaneous Q24H  . famotidine  20 mg  Oral BID  . insulin aspart  0-15 Units Subcutaneous TID WC  . insulin aspart  0-5 Units Subcutaneous QHS  . insulin NPH Human  15 Units Subcutaneous QHS  . [START ON 05/31/2015] insulin NPH Human  40 Units Subcutaneous QAC breakfast  . metoprolol  50 mg Oral BID  . tamsulosin  0.4 mg Oral Daily  . vitamin B-12  100 mcg Oral Daily    Continuous Infusions:    Time spent: 72mins  Dazja Houchin MD, PhD  [facility] Pager [phone]. If 7PM-7AM, please contact night-coverage at [email or web address],  password [facility] 05/30/2015, 12:53 PM

## 2015-05-30 NOTE — Progress Notes (Signed)
  Echocardiogram 2D Echocardiogram has been performed.  Joseph Mora 05/30/2015, 3:46 PM

## 2015-05-30 NOTE — Progress Notes (Signed)
Inpatient Diabetes Program Recommendations  AACE/ADA: New Consensus Statement on Inpatient Glycemic Control (2013)  Target Ranges:  Prepandial:   less than 140 mg/dL      Peak postprandial:   less than 180 mg/dL (1-2 hours)      Critically ill patients:  140 - 180 mg/dL   Consult received for "guidance for DM management". Home meds: NPH 60 units in the am 30 units at [facility] In[facility]: Pt ordered NPH 60 units in the am and 15 units last HS. Pt has had hypoglycemia following correction dose of 15 units novolog on first day of admission. PO intake has been NPO to 25-50%. Would recommend decrease of NPH to 30 units and 10 units at HS and sensitive correction only tidwc. Can increase doses as needed. Per ADA for a patient at 81 y/o it is not recommended to control glucose quite as tightly as younger patients. Will follow and glad to assist.   Thank you Rosita Kea, RN, MSN, CDE  Diabetes Inpatient Program Office: [phone] Pager: [phone] 8:00 am to 5:00 pm

## 2015-05-30 NOTE — Progress Notes (Signed)
Hypoglycemic Event  CBG: 57  Treatment: D50 IV 25 mL  Symptoms: Shaky  Follow-up CBG: [id number] CBG Result:110  Possible Reasons for Event: Inadequate meal intake  Comments/MD notified:K.Schorr,NP    Joseph Mora  Remember to initiate Hypoglycemia Order Set & complete

## 2015-05-31 ENCOUNTER — Observation Stay (HOSPITAL_BASED_OUTPATIENT_CLINIC_OR_DEPARTMENT_OTHER): Payer: Medicare Other

## 2015-05-31 ENCOUNTER — Encounter: Payer: Medicare Other | Admitting: Internal Medicine

## 2015-05-31 DIAGNOSIS — R609 Edema, unspecified: Secondary | ICD-10-CM

## 2015-05-31 DIAGNOSIS — T783XXA Angioneurotic edema, initial encounter: Secondary | ICD-10-CM | POA: Diagnosis not present

## 2015-05-31 DIAGNOSIS — F05 Delirium due to known physiological condition: Secondary | ICD-10-CM

## 2015-05-31 DIAGNOSIS — I10 Essential (primary) hypertension: Secondary | ICD-10-CM | POA: Diagnosis not present

## 2015-05-31 DIAGNOSIS — R627 Adult failure to thrive: Secondary | ICD-10-CM

## 2015-05-31 DIAGNOSIS — E119 Type 2 diabetes mellitus without complications: Secondary | ICD-10-CM | POA: Diagnosis not present

## 2015-05-31 DIAGNOSIS — N179 Acute kidney failure, unspecified: Secondary | ICD-10-CM | POA: Diagnosis not present

## 2015-05-31 LAB — CBC WITH DIFFERENTIAL/PLATELET
BASOS ABS: 0 10*3/uL (ref 0.0–0.1)
BASOS PCT: 0 % (ref 0–1)
EOS ABS: 0.3 10*3/uL (ref 0.0–0.7)
EOS PCT: 4 % (ref 0–5)
HCT: 44 % (ref 39.0–52.0)
Hemoglobin: 14.6 g/dL (ref 13.0–17.0)
Lymphocytes Relative: 22 % (ref 12–46)
Lymphs Abs: 1.3 10*3/uL (ref 0.7–4.0)
MCH: 31.6 pg (ref 26.0–34.0)
MCHC: 33.2 g/dL (ref 30.0–36.0)
MCV: 95.2 fL (ref 78.0–100.0)
MONO ABS: 0.4 10*3/uL (ref 0.1–1.0)
MONOS PCT: 7 % (ref 3–12)
NEUTROS ABS: 4 10*3/uL (ref 1.7–7.7)
Neutrophils Relative %: 66 % (ref 43–77)
PLATELETS: 109 10*3/uL — AB (ref 150–400)
RBC: 4.62 MIL/uL (ref 4.22–5.81)
RDW: 13.5 % (ref 11.5–15.5)
WBC: 6 10*3/uL (ref 4.0–10.5)

## 2015-05-31 LAB — HEPATITIS PANEL, ACUTE
HCV Ab: <0.1 s/co ratio (ref 0.0–0.9)
HEP A IGM: NEGATIVE
HEP B C IGM: NEGATIVE
HEP B S AG: NEGATIVE

## 2015-05-31 LAB — GLUCOSE, CAPILLARY
GLUCOSE-CAPILLARY: 133 mg/dL — AB (ref 65–99)
GLUCOSE-CAPILLARY: 230 mg/dL — AB (ref 65–99)
GLUCOSE-CAPILLARY: 99 mg/dL (ref 65–99)
GLUCOSE-CAPILLARY: 122 mg/dL — AB (ref 65–99)

## 2015-05-31 LAB — COMPREHENSIVE METABOLIC PANEL
ALK PHOS: 51 U/L (ref 38–126)
ALT: 27 U/L (ref 17–63)
AST: 23 U/L (ref 15–41)
Albumin: 2.6 g/dL — ABNORMAL LOW (ref 3.5–5.0)
Anion gap: 8 (ref 5–15)
BILIRUBIN TOTAL: 1.2 mg/dL (ref 0.3–1.2)
BUN: 10 mg/dL (ref 6–20)
CALCIUM: 8.1 mg/dL — AB (ref 8.9–10.3)
CO2: 28 mmol/L (ref 22–32)
Chloride: 102 mmol/L (ref 101–111)
Creatinine, Ser: 0.99 mg/dL (ref 0.61–1.24)
GFR calc Af Amer: >60 mL/min (ref >60)
GLUCOSE: 120 mg/dL — AB (ref 65–99)
POTASSIUM: 4.1 mmol/L (ref 3.5–5.1)
Sodium: 138 mmol/L (ref 135–145)
TOTAL PROTEIN: 5.1 g/dL — AB (ref 6.5–8.1)

## 2015-05-31 LAB — RPR: RPR: NONREACTIVE

## 2015-05-31 LAB — MAGNESIUM: MAGNESIUM: 2.1 mg/dL (ref 1.7–2.4)

## 2015-05-31 LAB — HIV ANTIBODY (ROUTINE TESTING W REFLEX): HIV Screen 4th Generation wRfx: NONREACTIVE

## 2015-05-31 MED ORDER — METOPROLOL TARTRATE 100 MG PO TABS
100.0000 mg | ORAL_TABLET | Freq: Two times a day (BID) | ORAL | Status: DC
Start: 2015-05-31 — End: 2015-05-31

## 2015-05-31 MED ORDER — METOPROLOL TARTRATE 50 MG PO TABS
75.0000 mg | ORAL_TABLET | Freq: Two times a day (BID) | ORAL | Status: DC
  Administered 2015-05-31 – 2015-06-01 (×2): 75 mg via ORAL
  Filled 2015-05-31 (×4): qty 1

## 2015-05-31 NOTE — Progress Notes (Addendum)
PROGRESS NOTE  Joseph Mora KGY:[id number] DOB: 1933/02/10 DOA: 05/28/2015 PCP: Elby Showers, MD  HPI/Recap of past 24 hours:  Sitting in chair, multiple family member in room, Remain confused, but very pleasant, know the year but not the month.  Reported lip blister started to dry up and crust, denies pain , denies headache, no fever.   Assessment/Plan: Principal Problem:   Angioedema Active Problems:   CAD (coronary artery disease)   Hyperlipidemia   Insulin dependent diabetes mellitus   HTN (hypertension)   Acute encephalopathy   AKI (acute kidney injury)  Angioedema?: patient reported his tongue was" thick" with facial swelling prior to being admitted to the hospital. Now better, he received treatment with steroids/benadryl/epipen, continue pepcid here. Better. D/c lisinopril.  Confusion:  Talked to wife who reported patient has been having intermittent confusion at least for 72months, she also noticed resting tremor , more on the right hand,  Patient has multiple family member has dementia.  CT head no acute findings, +atrophy and chronic small vessel disease. Initial mild bun/cr /lft elevation has resolved.  tsh wnl, low normal b12/thiamine pending/ammonia wnl. HIV/rpr negative. Will get MRI of brain today, if negative, refer to outpatient neurology for evaluation and management of dementia? Parkinson's ( mild resting tremor right hand)  Lip blister/pain,  no oral lesion, no head ache, blood hsv titer pending,  start topical treatment and acyclovir iv, monitor mental status and renal function. Better. Will liked discharge with oral acyclovir.  Acute kidney injury: improved with ivf, now has lower extremity edema, d/c ivf. ua no infection. Close monitor renal function. Cr normalized on 9/7  Mild elevation of lft:  unclear etiology,  ab [place] poor quality, acute hepatitis panel negative. No n/v, no ab pain, wanting to eat. Denies h/o alcohol.   lft return to normal on  9/7,  Bilateral lower extremity edema:  Venous doppler pending Echo showed LVEF WNL, grade 1 diastolic dysfunction. close monitor volume status.  Need lasix prn and outpatient cardiology follow up.  IDDM2, recent a1c 8.2 in 04/2015, continue insulin, adjust prn, had hypoglycemia episode on 9/6, insulin dose decreased  HTN; continue lopressor, d/c acei, bp stable.  H/o CAD s/p stents, denies chest pain, continue asa/statin/betablocker. Most recent stress test in 03/2013 no obvious ischemia. Followed by cardiology Dr Angelena Form.   Frequent ectopic beats (PVC's): asymptomatic, keep k>4, mag >2, tsh wnl , increase lopressor from 50mg  po bid to 75mg  po bid, keep on tele. Monitor heart rate and blood pressure.  Bph: d/c cardura, change to flomax, give room to titrate up lopressor.   FTT/ generalized weakess/confusion: pt eval, need home health    Code Status: full  Family Communication: patient and family  Disposition Plan: home with home health on 9/9   Consultants:  none  Procedures:  none  Antibiotics:  none   Objective: BP 157/82 mmHg  Pulse 80  Temp(Src) 98.4 F (36.9 C) (Oral)  Resp 20  Ht 5\' 4"  (1.626 m)  Wt 203 lb 7.8 oz (92.3 kg)  BMI 34.91 kg/m2  SpO2 96%  Intake/Output Summary (Last 24 hours) at 05/31/15 1350 Last data filed at 05/31/15 0700  Gross per 24 hour  Intake    480 ml  Output    650 ml  Net   -170 ml   Filed Weights   05/28/15 0854 05/28/15 1301  Weight: 190 lb (86.183 kg) 203 lb 7.8 oz (92.3 kg)    Exam:   General:  NAD, lip  blister start to crust, no pain today.  Cardiovascular: RRR with frequent ectopic beats and +murmur  Respiratory: CTABL  Abdomen: Soft/ND/NT, positive BS  Musculoskeletal: 1-2+pitting  Edema bilateral lower extremity,   Neuro: not able to state the correct month, remain slightly confused.  Data Reviewed: Basic Metabolic Panel:  Recent Labs Lab 05/25/15 1940 05/25/15 2106 05/28/15 0920 05/28/15 1348  05/29/15 0350 05/30/15 0420 05/31/15 0432  NA 132*  --  134*  --  139 139 138  K 7.2* 4.3 4.2  --  4.0 3.4* 4.1  CL 101  --  102  --  107 104 102  CO2 24  --  25  --  25 28 28   GLUCOSE 220*  --  228*  --  107* 58* 120*  BUN 13  --  29*  --  22* 13 10  CREATININE 1.26*  --  1.52* 1.38* 1.15 1.01 0.99  CALCIUM 8.0*  --  8.1*  --  7.8* 7.8* 8.1*  MG  --   --   --   --   --  2.0 2.1   Liver Function Tests:  Recent Labs Lab 05/25/15 1940 05/28/15 0920 05/30/15 0420 05/31/15 0432  AST 65* 53* 26 23  ALT 36 30 28 27   ALKPHOS 64 46 41 51  BILITOT 1.9* 1.3* 0.8 1.2  PROT >12.0* 4.8* 4.7* 5.1*  ALBUMIN 3.2* 2.8* 2.5* 2.6*   No results for input(s): LIPASE, AMYLASE in the last 168 hours.  Recent Labs Lab 05/30/15 0420  AMMONIA 22   CBC:  Recent Labs Lab 05/25/15 1940 05/28/15 0920 05/28/15 1348 05/29/15 0350 05/31/15 0432  WBC 12.8* 7.1 6.4 5.6 6.0  NEUTROABS  --  5.9  --   --  4.0  HGB 17.9* 16.8 17.1* 14.7 14.6  HCT 51.2 47.6 49.4 43.5 44.0  MCV 92.4 92.4 92.9 94.0 95.2  PLT 133* 95* 101* 105* 109*   Cardiac Enzymes:    Recent Labs Lab 05/28/15 1348 05/28/15 2100 05/29/15 0350  TROPONINI <0.03 <0.03 <0.03   BNP (last 3 results) No results for input(s): BNP in the last 8760 hours.  ProBNP (last 3 results) No results for input(s): PROBNP in the last 8760 hours.  CBG:  Recent Labs Lab 05/30/15 1146 05/30/15 1643 05/30/15 2123 05/31/15 0801 05/31/15 1140  GLUCAP 137* 160* 86 133* 230*    Recent Results (from the past 240 hour(s))  Urine culture     Status: None   Collection Time: 05/28/15  9:34 AM  Result Value Ref Range Status   Specimen Description URINE, CLEAN CATCH  Final   Special Requests NONE  Final   Culture MULTIPLE SPECIES PRESENT, SUGGEST RECOLLECTION  Final   Report Status 05/29/2015 FINAL  Final     Studies: No results found.  Scheduled Meds: . acyclovir  600 mg Intravenous 3 times per day  . aspirin EC  81 mg Oral Daily    . atorvastatin  40 mg Oral Daily  . enoxaparin (LOVENOX) injection  40 mg Subcutaneous Q24H  . famotidine  20 mg Oral BID  . insulin aspart  0-15 Units Subcutaneous TID WC  . insulin aspart  0-5 Units Subcutaneous QHS  . insulin NPH Human  15 Units Subcutaneous QHS  . insulin NPH Human  40 Units Subcutaneous QAC breakfast  . metoprolol  50 mg Oral BID  . tamsulosin  0.4 mg Oral Daily  . vitamin B-12  100 mcg Oral Daily    Continuous Infusions:  Time spent: 20mins  Lorena Benham MD, PhD  [facility] Pager [phone]. If 7PM-7AM, please contact night-coverage at [email or web address], password [facility] 05/31/2015, 1:50 PM

## 2015-05-31 NOTE — Progress Notes (Signed)
VASCULAR LAB PRELIMINARY  PRELIMINARY  PRELIMINARY  PRELIMINARY  Bilateral lower extremity venous duplex completed.    Preliminary report: Bilateral:  No evidence of DVT, superficial thrombosis, or Baker's Cyst.   Matika Bartell, RVS 05/31/2015, 3:08 PM

## 2015-05-31 NOTE — Progress Notes (Signed)
Physical Therapy Treatment Patient Details Name: Joseph Mora MRN: [id number] DOB: [birth year] Today's Date: 05/31/2015    History of Present Illness 81 y.o. male with a past medical history of insulin dependent diabetes mellitus, hypertension on ACE inhibitor therapy, who presented to the emergency room on 05/25/2015 with complaints of tongue swelling associated with significant pruritus. ER report mentioning he was bitten by fire ants prior to onset of symptoms. He was treated with epinephrine and was discharged on Benadryl and prednisone. Despite taking Benadryl and prednisone his wife reported that in the interim has developed progressive rash involving his trunk and arms and legs. She reported patient having facial swelling as "he couldn't even open his eyes," as well as increased confusion, disorientation, and a functional decline.    PT Comments    Pt making progress with PT at overall steady to min A level for mobility with RW at this time. Pt requires cues for safety and presents with impaired cognition. Pt's family (wife and son (son appears to have cognitive deficits)) present during session with wife reporting that pt still has increased issue with agitation and confusion as the day progresses. Continue to recommend 24/7 Supervision upon d/c for safety and continued PT to address impairments in balance, strength, endurance, and safety due to increased fall risk.   Follow Up Recommendations  Supervision/Assistance - 24 hour;Home health PT     Equipment Recommendations  Rolling walker with 5" wheels    Recommendations for Other Services       Precautions / Restrictions Precautions Precautions: Fall Restrictions Weight Bearing Restrictions: No    Mobility  Bed Mobility Overal bed mobility: Modified Independent                Transfers Overall transfer level: Needs assistance Equipment used: Rolling walker (2 wheeled) Transfers: Sit to/from Stand Sit to Stand: Min  guard         General transfer comment: Cues for safety and technique - steady A for balance needed initially  Ambulation/Gait Ambulation/Gait assistance: Min guard;Min assist Ambulation Distance (Feet): 125 Feet Assistive device: Rolling walker (2 wheeled) Gait Pattern/deviations: Staggering left;Staggering right;Decreased stride length;Shuffle     General Gait Details: Cues for Rw management especially during turns and obstacle negotiation. Cues for longer step length and safety with AD.   Stairs            Wheelchair Mobility    Modified Rankin (Stroke Patients Only)       Balance Overall balance assessment: Needs assistance Sitting-balance support: Feet supported;No upper extremity supported Sitting balance-Leahy Scale: Good     Standing balance support: During functional activity;No upper extremity supported Standing balance-Leahy Scale: Fair Standing balance comment: Requires steady A when donning gown in standing                    Cognition Arousal/Alertness: Awake/alert Behavior During Therapy: Impulsive Overall Cognitive Status: Impaired/Different from baseline Area of Impairment: Memory;Problem solving;Safety/judgement;Orientation Orientation Level: Disoriented to;Time   Memory: Decreased short-term memory Following Commands: Follows multi-step commands consistently;Follows one step commands with increased time Safety/Judgement: Decreased awareness of safety;Decreased awareness of deficits   Problem Solving: Requires verbal cues General Comments: Pt know where and why he is here but difficulty with recall of information (knows they fostered children but unclear about # adopted), difficulty with pathfinding or recalling room # or unit when PT told pt. Pt impulsive at times but able to follow commands with extra time and dislayed no aggressive behaviors  Exercises      General Comments        Pertinent Vitals/Pain Pain Assessment:  No/denies pain    Home Living                      Prior Function            PT Goals (current goals can now be found in the care plan section) Acute Rehab PT Goals PT Goal Formulation: With patient/family Time For Goal Achievement: 06/11/15 Potential to Achieve Goals: Fair Progress towards PT goals: Progressing toward goals    Frequency  Min 3X/week    PT Plan Current plan remains appropriate    Co-evaluation             End of Session   Activity Tolerance: Patient tolerated treatment well Patient left: in chair;with call bell/phone within reach;with chair alarm set;with family/visitor present     Time: 1045-1100 PT Time Calculation (min) (ACUTE ONLY): 15 min  Charges:  $Gait Training: 8-22 mins                    G Codes:      Juanna Cao, PT, DPT Pager #: [phone]  05/31/2015, 11:09 AM

## 2015-06-01 ENCOUNTER — Observation Stay (HOSPITAL_COMMUNITY): Payer: Medicare Other

## 2015-06-01 DIAGNOSIS — M6281 Muscle weakness (generalized): Secondary | ICD-10-CM | POA: Diagnosis not present

## 2015-06-01 DIAGNOSIS — R269 Unspecified abnormalities of gait and mobility: Secondary | ICD-10-CM | POA: Diagnosis not present

## 2015-06-01 DIAGNOSIS — R410 Disorientation, unspecified: Secondary | ICD-10-CM | POA: Diagnosis not present

## 2015-06-01 DIAGNOSIS — L299 Pruritus, unspecified: Secondary | ICD-10-CM | POA: Diagnosis not present

## 2015-06-01 DIAGNOSIS — F0390 Unspecified dementia without behavioral disturbance: Secondary | ICD-10-CM

## 2015-06-01 DIAGNOSIS — B009 Herpesviral infection, unspecified: Secondary | ICD-10-CM

## 2015-06-01 DIAGNOSIS — N179 Acute kidney failure, unspecified: Secondary | ICD-10-CM | POA: Diagnosis not present

## 2015-06-01 DIAGNOSIS — T783XXA Angioneurotic edema, initial encounter: Secondary | ICD-10-CM | POA: Diagnosis not present

## 2015-06-01 DIAGNOSIS — E119 Type 2 diabetes mellitus without complications: Secondary | ICD-10-CM | POA: Diagnosis not present

## 2015-06-01 DIAGNOSIS — I10 Essential (primary) hypertension: Secondary | ICD-10-CM | POA: Diagnosis not present

## 2015-06-01 DIAGNOSIS — I5032 Chronic diastolic (congestive) heart failure: Secondary | ICD-10-CM

## 2015-06-01 LAB — BASIC METABOLIC PANEL
ANION GAP: 8 (ref 5–15)
BUN: 10 mg/dL (ref 6–20)
CALCIUM: 8.1 mg/dL — AB (ref 8.9–10.3)
CO2: 29 mmol/L (ref 22–32)
Chloride: 102 mmol/L (ref 101–111)
Creatinine, Ser: 1.04 mg/dL (ref 0.61–1.24)
Glucose, Bld: 69 mg/dL (ref 65–99)
Potassium: 3.4 mmol/L — ABNORMAL LOW (ref 3.5–5.1)
Sodium: 139 mmol/L (ref 135–145)

## 2015-06-01 LAB — CBC
HEMATOCRIT: 44.4 % (ref 39.0–52.0)
Hemoglobin: 15 g/dL (ref 13.0–17.0)
MCH: 32 pg (ref 26.0–34.0)
MCHC: 33.8 g/dL (ref 30.0–36.0)
MCV: 94.7 fL (ref 78.0–100.0)
PLATELETS: 130 10*3/uL — AB (ref 150–400)
RBC: 4.69 MIL/uL (ref 4.22–5.81)
RDW: 13.4 % (ref 11.5–15.5)
WBC: 7.6 10*3/uL (ref 4.0–10.5)

## 2015-06-01 LAB — HERPES SIMPLEX VIRUS(HSV) DNA BY PCR
HSV 1 DNA: NEGATIVE
HSV 2 DNA: NEGATIVE

## 2015-06-01 LAB — GLUCOSE, CAPILLARY
GLUCOSE-CAPILLARY: 161 mg/dL — AB (ref 65–99)
Glucose-Capillary: 78 mg/dL (ref 65–99)

## 2015-06-01 LAB — VITAMIN B1: Vitamin B1 (Thiamine): 176.4 nmol/L (ref 66.5–200.0)

## 2015-06-01 MED ORDER — CYANOCOBALAMIN 100 MCG PO TABS: 100.0000 ug | ORAL_TABLET | Freq: Every day | ORAL | Status: DC

## 2015-06-01 MED ORDER — TRIAMTERENE-HCTZ 37.5-25 MG PO CAPS: 1.0000 | ORAL_CAPSULE | Freq: Every day | ORAL | Status: DC

## 2015-06-01 MED ORDER — INSULIN NPH (HUMAN) (ISOPHANE) 100 UNIT/ML ~~LOC~~ SUSP: SUBCUTANEOUS | Status: DC

## 2015-06-01 MED ORDER — FAMOTIDINE 20 MG PO TABS: 20.0000 mg | ORAL_TABLET | Freq: Two times a day (BID) | ORAL | Status: DC

## 2015-06-01 MED ORDER — METOPROLOL TARTRATE 75 MG PO TABS: 75.0000 mg | ORAL_TABLET | Freq: Two times a day (BID) | ORAL | Status: DC

## 2015-06-01 MED ORDER — ACYCLOVIR 400 MG PO TABS: 400.0000 mg | ORAL_TABLET | Freq: Three times a day (TID) | ORAL | Status: DC

## 2015-06-01 NOTE — Discharge Summary (Signed)
Discharge Summary  Joseph Mora PIR:[id number] DOB: [birth year]  PCP: Elby Showers, MD  Admit date: 05/28/2015 Discharge date: 06/01/2015  Time spent: >20mins  Recommendations for Outpatient Follow-up:  1. F/u with PMD within a week , repeat bmp/mag at follow up, newly started on diuretics maxzide, and is prescribed acyclovir for 7 days. pmd to continue monitor blood sugar control and adjust insulin dose. pmd to follow up on final hsv titer result. 2. F/u with [facility] for possible dementia/parkinson's management. 3. F/u with cardiology for diastolic chf/frequent pvc's , meds adjusted at discharge, lopressor dose increased to suppress frequent pvc's, newly started on maxide for lower extremity edema control and bp control, lisinopril stopped (due to possible angioedema)  Discharge Diagnoses:  Active Hospital Problems   Diagnosis Date Noted  . Angioedema 05/28/2015  . Acute encephalopathy 05/28/2015  . AKI (acute kidney injury) 05/28/2015  . HTN (hypertension) 05/23/2012  . Insulin dependent diabetes mellitus 04/21/2011  . CAD (coronary artery disease) 04/03/2011  . Hyperlipidemia 04/03/2011    Resolved Hospital Problems   Diagnosis Date Noted Date Resolved  No resolved problems to display.    Discharge Condition: stable  Diet recommendation: heart healthy/carb modified  Filed Weights   05/28/15 0854 05/28/15 1301  Weight: 190 lb (86.183 kg) 203 lb 7.8 oz (92.3 kg)    History of present illness:  Joseph Mora is a 81 y.o. male with a past medical history of insulin dependent diabetes mellitus, hypertension on ACE inhibitor therapy, who presented to the emergency room on 05/25/2015 with complaints of tongue swelling associated with significant pruritus. ER report mentioning he was bitten by fire ants prior to onset of symptoms. He was treated with epinephrine and was discharged on Benadryl and prednisone. Despite taking Benadryl and prednisone his wife  reports that in the interim has developed progressive rash involving his trunk and arms and legs. This morning she reported patient having facial swelling as "he couldn't even open his eyes." His wife also reports patient having increased confusion, disoriented, having a functional decline. His wife confirming that he takes lisinopril daily.    Hospital Course:  Principal Problem:   Angioedema Active Problems:   CAD (coronary artery disease)   Hyperlipidemia   Insulin dependent diabetes mellitus   HTN (hypertension)   Acute encephalopathy   AKI (acute kidney injury)  Angioedema?: patient reported his tongue was" thick" with facial swelling prior to being admitted to the hospital. Now better, he received treatment with steroids/benadryl/epipen, continue pepcid. No facial edema at time of discharge. D/c lisinopril.  Confusion:  Talked to wife who reported patient has been having intermittent confusion at least for 76months, she also noticed resting tremor , more on the right hand, Patient has multiple family member has dementia.  CT head no acute findings, +atrophy and chronic small vessel disease. Initial mild bun/cr /lft elevation has resolved. tsh wnl, low normal b12/thiamine wnl/ammonia wnl. HIV/rpr negative. MRI of brain no acute findings, again showed chronic small vessel disease and atrophy. refer to outpatient neurology for evaluation and management of likely progressive dementia and possible ? Parkinson's ?( mild resting tremor right hand) Started b12 supplement.  Lip blister/pain,  no oral lesion, no head ache, blood hsv titer pending,  start topical treatment and acyclovir iv, monitor mental status and renal function. Better.   discharge with oral acyclovir 400mg  po tid for 20more days, pmd to repeat bmp to monitor renal function while on acyclovir.  Acute kidney injury: (initial on admission)  resolved with ivf. ua no infection.    Close monitor renal function. Cr normalized on 9/7  Mild elevation of lft: (initial on admission) unclear etiology, ab [place] poor quality, acute hepatitis panel negative.  No n/v, no ab pain, wanting to eat. Denies h/o alcohol.  lft return to normal on 9/7,  Bilateral lower extremity edema:  Venous doppler no DVT. Echo showed LVEF WNL, grade 1 diastolic dysfunction. close monitor volume status.  Start maxzide at discharge and outpatient cardiology follow up.  IDDM2, recent a1c 8.2 in 04/2015, continue insulin, adjust prn, had an hypoglycemia episode in the hospital , insulin dose decreased Patient was on 60units before breakfast and 30units before dinner, this is changed to 40units before breakfast and 15units before dinner.  Suspect patient is not compliant with carb modified diet at home, need ongoing education and pmd to continue monitor blood glucose control and titrate insulin dose.  HTN; increase lopressor, start maxzide, d/c acei, bp stable. pmd to continue adjust bp meds  H/o CAD s/p stents, denies chest pain, continue asa/statin/betablocker. Most recent stress test in 03/2013 no obvious ischemia. Followed by cardiology Dr Angelena Form.   Frequent ectopic beats (PVC's): asymptomatic, keep k>4, mag >2, tsh wnl , increase lopressor from 50mg  po bid to 75mg  po bid, outpatient cardiology follow up.  Bph: d/c cardura, change to flomax, give room to titrate up lopressor.   FTT/ generalized weakess/confusion: pt eval, home health arranged    Code Status: full  Family Communication: patient and family  Disposition Plan: home with home health on 9/9   Consultants:  none  Procedures:  none  Antibiotics:  IV acyclovir in the hospital, discharge with oral acyclovir  Discharge Exam: BP 153/76 mmHg  Pulse 79  Temp(Src) 98.1 F (36.7 C) (Axillary)  Resp 18  Ht 5\' 4"  (1.626 m)  Wt 203 lb 7.8 oz (92.3 kg)  BMI 34.91 kg/m2  SpO2 100%   General: NAD, lip blister start  to crust, no pain today.  Cardiovascular: RRR with frequent ectopic beats and +murmur  Respiratory: CTABL  Abdomen: Soft/ND/NT, positive BS  Musculoskeletal: resolving pitting Edema bilateral lower extremity  Neuro:  remain slightly confused, oriented to person and place, not to time, very pleasant.  Discharge Instructions You were cared for by a hospitalist during your hospital stay. If you have any questions about your discharge medications or the care you received while you were in the hospital after you are discharged, you can call the unit and asked to speak with the hospitalist on call if the hospitalist that took care of you is not available. Once you are discharged, your primary care physician will handle any further medical issues. Please note that NO REFILLS for any discharge medications will be authorized once you are discharged, as it is imperative that you return to your primary care physician (or establish a relationship with a primary care physician if you do not have one) for your aftercare needs so that they can reassess your need for medications and monitor your lab values.      Discharge Instructions    Diet - low sodium heart healthy    Complete by:  As directed   Carb modified     Face-to-face encounter (required for Medicare/Medicaid patients)    Complete by:  As directed   I Yacqub Baston certify that this patient is under my care and that I, or a nurse practitioner or physician's assistant working with me, had a face-to-face encounter that meets the physician  face-to-face encounter requirements with this patient on 05/29/2015. The encounter with the patient was in whole, or in part for the following medical condition(s) which is the primary reason for home health care (List medical condition): FTT  The encounter with the patient was in whole, or in part, for the following medical condition, which is the primary reason for home health care:  FTT  I certify that, based on my  findings, the following services are medically necessary home health services:  Physical therapy  Reason for Medically Necessary Home Health Services:  Skilled Nursing- Change/Decline in Patient Status  My clinical findings support the need for the above services:  Unsafe ambulation due to balance issues  Further, I certify that my clinical findings support that this patient is homebound due to:  Unsafe ambulation due to balance issues     Home Health    Complete by:  As directed   To provide the following care/treatments:  PT     Increase activity slowly    Complete by:  As directed             Medication List    STOP taking these medications        diphenhydrAMINE 25 MG tablet  Commonly known as:  BENADRYL     doxazosin 2 MG tablet  Commonly known as:  CARDURA     EPINEPHrine 0.15 MG/0.3ML injection  Commonly known as:  EPIPEN JR     lisinopril 20 MG tablet  Commonly known as:  PRINIVIL,ZESTRIL     predniSONE 10 MG tablet  Commonly known as:  DELTASONE      TAKE these medications        acyclovir 400 MG tablet  Commonly known as:  ZOVIRAX  Take 1 tablet (400 mg total) by mouth 3 (three) times daily.     aspirin 81 MG tablet  Take 81 mg by mouth daily.     atorvastatin 40 MG tablet  Commonly known as:  LIPITOR  TAKE 1 TABLET BY MOUTH DAILY     cyanocobalamin 100 MCG tablet  Take 1 tablet (100 mcg total) by mouth daily.     famotidine 20 MG tablet  Commonly known as:  PEPCID  Take 1 tablet (20 mg total) by mouth 2 (two) times daily.     insulin NPH Human 100 UNIT/ML injection  Commonly known as:  HUMULIN N  40units subq before breakfast and 15units before dinner     INSULIN SYRINGE 1CC/30GX5/16" 30G X 5/16" 1 ML Misc  USE TWICE DAILY OR AS DIRECTED     Metoprolol Tartrate 75 MG Tabs  Take 75 mg by mouth 2 (two) times daily.     nitroGLYCERIN 0.4 MG SL tablet  Commonly known as:  NITROSTAT  Place 1 tablet (0.4 mg total) under the tongue every 5 (five)  minutes as needed for chest pain.     tamsulosin 0.4 MG Caps capsule  Commonly known as:  FLOMAX  TAKE ONE CAPSULE BY MOUTH EVERY DAY     triamterene-hydrochlorothiazide 37.5-25 MG per capsule  Commonly known as:  DYAZIDE  Take 1 each (1 capsule total) by mouth daily.       Allergies  Allergen Reactions  . Morphine And Related Itching  . Penicillins Hives   Follow-up Information    Follow up with [facility] In 2 weeks.   Why:  for dementia and possible parkinson's evaluation.   Contact information:   [address]  [place] [address] [phone]      Follow up with Elby Showers, MD In 1 week.   Specialty:  Internal Medicine   Why:  hospital discharge follow up, repeat bmp/mag at follow up   Contact information:   [address] [place] [address] [phone]       Follow up with Lauree Chandler, MD In 3 weeks.   Specialty:  Cardiology   Why:  diastolic CHF,lower extremity edema, frequent PVC's. linisopril allergy.   Contact information:   [address] [place]  [address] [phone]        The results of significant diagnostics from this hospitalization (including imaging, microbiology, ancillary and laboratory) are listed below for reference.    Significant Diagnostic Studies: Ct Head Wo Contrast  05/29/2015   CLINICAL DATA:  81 year old who presented yesterday with encephalopathy, acute mental status changes, and was combative. The encephalopathy is felt to be secondary to an acute allergic reaction to fire ant bites, as he presented with tongue swelling and facial swelling as well. Patient much improved today. Current history of hypertension and diabetes.  EXAM: CT HEAD WITHOUT CONTRAST  TECHNIQUE: Contiguous axial images were obtained from the base of the skull through the vertex without intravenous contrast.  COMPARISON:  None.  FINDINGS: Moderate cortical atrophy. Mild deep  atrophy. No visible cerebellar atrophy. Moderate changes of small vessel disease of the white matter diffusely, including the pons and midbrain. No mass lesion. No midline shift. No acute hemorrhage or hematoma. No extra-axial fluid collections. No evidence of acute infarction.  No skull fracture or other focal osseous abnormality involving the skull. Old healed fracture involving the medial wall of the left orbit. Visualized paranasal sinuses, bilateral mastoid air cells and bilateral middle ear cavities well-aerated. Moderate to severe bilateral carotid siphon atherosclerosis. Note made of calcified pannus posterior to the visualized dense.  IMPRESSION: 1. No acute intracranial abnormality. 2. Mild to moderate generalized atrophy and moderate chronic microvascular ischemic changes of the white matter.   Electronically Signed   By: Evangeline Dakin M.D.   On: 05/29/2015 10:33   Mr Brain Wo Contrast  06/01/2015   CLINICAL DATA:  81 year old male with increased confusion, acute functional decline. Acute tongue swelling, pruritus. Initial encounter.  EXAM: MRI HEAD WITHOUT CONTRAST  TECHNIQUE: Multiplanar, multiecho pulse sequences of the brain and surrounding structures were obtained without intravenous contrast.  COMPARISON:  Head CT without contrast 05/29/2015.  FINDINGS: Cerebral volume is within normal limits for age. No restricted diffusion to suggest acute infarction. No midline shift, mass effect, evidence of mass lesion, ventriculomegaly, extra-axial collection or acute intracranial hemorrhage. Cervicomedullary junction and pituitary are within normal limits. Negative visualized cervical spine. Major intracranial vascular flow voids are preserved. There is mild ectasia at the basilar artery tip. There may be azygos type proximal ACA anatomy.  Mild for age scattered nonspecific cerebral white matter T2 and FLAIR hyperintensity. No cortical encephalomalacia or chronic cerebral blood products are identified.  Mild to moderate T2 heterogeneity in the deep gray matter nuclei and pons. T2 heterogeneity also in the left cerebellar peduncle. Cerebellum otherwise within normal limits for age.  Visible internal auditory structures appear normal. Trace paranasal sinus and mastoid fluid. Postoperative changes to both globes, otherwise negative orbit and scalp soft tissues. Normal bone marrow signal.  IMPRESSION: 1.  No acute intracranial abnormality. 2. Mild to moderate for age nonspecific signal changes, most commonly due to chronic small vessel disease.   Electronically Signed   By:  Genevie Ann M.D.   On: 06/01/2015 09:30   Dg Chest Portable 1 View  05/25/2015   CLINICAL DATA:  Anaphylaxis, unresponsive.  EXAM: PORTABLE CHEST - 1 VIEW  COMPARISON:  06/13/2010 and prior radiographs  FINDINGS: The cardiomediastinal silhouette is unremarkable.  This is a low volume film.  There is no evidence of focal airspace disease, pulmonary edema, suspicious pulmonary nodule/mass, pleural effusion, or pneumothorax. No acute bony abnormalities are identified.  IMPRESSION: No active disease.   Electronically Signed   By: Margarette Canada M.D.   On: 05/25/2015 20:48   US Abdomen Limited Ruq  05/30/2015   CLINICAL DATA:  Elevated liver function studies, history of diabetes and hypertension  EXAM: US ABDOMEN LIMITED - RIGHT UPPER QUADRANT  COMPARISON:  None in PACs  FINDINGS: Gallbladder:  The gallbladder is adequately distended with no evidence of stones, wall thickening, or pericholecystic fluid. There is no positive sonographic Murphy's sign.  Common bile duct:  Diameter: 5 mm  Liver:  Evaluation of the left hepatic lobe is limited due to bowel gas. Otherwise the liver is unremarkable.  IMPRESSION: 1. No gallstones or other acute gallbladder pathology. 2. Evaluation of the the liver is limited. Further evaluation with hepatic protocol CT scanning or MRI is recommended given the abnormal liver function studies.   Electronically Signed   By: David   Martinique M.D.   On: 05/30/2015 09:30    Microbiology: Recent Results (from the past 240 hour(s))  Urine culture     Status: None   Collection Time: 05/28/15  9:34 AM  Result Value Ref Range Status   Specimen Description URINE, CLEAN CATCH  Final   Special Requests NONE  Final   Culture MULTIPLE SPECIES PRESENT, SUGGEST RECOLLECTION  Final   Report Status 05/29/2015 FINAL  Final     Labs: Basic Metabolic Panel:  Recent Labs Lab 05/28/15 0920 05/28/15 1348 05/29/15 0350 05/30/15 0420 05/31/15 0432 06/01/15 0430  NA 134*  --  139 139 138 139  K 4.2  --  4.0 3.4* 4.1 3.4*  CL 102  --  107 104 102 102  CO2 25  --  25 28 28 29   GLUCOSE 228*  --  107* 58* 120* 69  BUN 29*  --  22* 13 10 10   CREATININE 1.52* 1.38* 1.15 1.01 0.99 1.04  CALCIUM 8.1*  --  7.8* 7.8* 8.1* 8.1*  MG  --   --   --  2.0 2.1  --    Liver Function Tests:  Recent Labs Lab 05/25/15 1940 05/28/15 0920 05/30/15 0420 05/31/15 0432  AST 65* 53* 26 23  ALT 36 30 28 27   ALKPHOS 64 46 41 51  BILITOT 1.9* 1.3* 0.8 1.2  PROT >12.0* 4.8* 4.7* 5.1*  ALBUMIN 3.2* 2.8* 2.5* 2.6*   No results for input(s): LIPASE, AMYLASE in the last 168 hours.  Recent Labs Lab 05/30/15 0420  AMMONIA 22   CBC:  Recent Labs Lab 05/28/15 0920 05/28/15 1348 05/29/15 0350 05/31/15 0432 06/01/15 0430  WBC 7.1 6.4 5.6 6.0 7.6  NEUTROABS 5.9  --   --  4.0  --   HGB 16.8 17.1* 14.7 14.6 15.0  HCT 47.6 49.4 43.5 44.0 44.4  MCV 92.4 92.9 94.0 95.2 94.7  PLT 95* 101* 105* 109* 130*   Cardiac Enzymes:  Recent Labs Lab 05/28/15 1348 05/28/15 2100 05/29/15 0350  TROPONINI <0.03 <0.03 <0.03   BNP: BNP (last 3 results) No results for input(s): BNP in the  last 8760 hours.  ProBNP (last 3 results) No results for input(s): PROBNP in the last 8760 hours.  CBG:  Recent Labs Lab 05/31/15 0801 05/31/15 1140 05/31/15 1701 05/31/15 2148 06/01/15 0755  GLUCAP 133* 230* 99 122* 78       Signed:  Mendi Constable MD,  PhD  [facility] 06/01/2015, 11:08 AM

## 2015-06-04 DIAGNOSIS — T783XXD Angioneurotic edema, subsequent encounter: Secondary | ICD-10-CM | POA: Diagnosis not present

## 2015-06-04 DIAGNOSIS — Z794 Long term (current) use of insulin: Secondary | ICD-10-CM | POA: Diagnosis not present

## 2015-06-04 DIAGNOSIS — E785 Hyperlipidemia, unspecified: Secondary | ICD-10-CM | POA: Diagnosis not present

## 2015-06-04 DIAGNOSIS — I10 Essential (primary) hypertension: Secondary | ICD-10-CM | POA: Diagnosis not present

## 2015-06-04 DIAGNOSIS — E109 Type 1 diabetes mellitus without complications: Secondary | ICD-10-CM | POA: Diagnosis not present

## 2015-06-04 DIAGNOSIS — I2510 Atherosclerotic heart disease of native coronary artery without angina pectoris: Secondary | ICD-10-CM | POA: Diagnosis not present

## 2015-06-04 DIAGNOSIS — E1165 Type 2 diabetes mellitus with hyperglycemia: Secondary | ICD-10-CM | POA: Diagnosis not present

## 2015-06-07 DIAGNOSIS — E1165 Type 2 diabetes mellitus with hyperglycemia: Secondary | ICD-10-CM | POA: Diagnosis not present

## 2015-06-07 DIAGNOSIS — I2510 Atherosclerotic heart disease of native coronary artery without angina pectoris: Secondary | ICD-10-CM | POA: Diagnosis not present

## 2015-06-07 DIAGNOSIS — Z794 Long term (current) use of insulin: Secondary | ICD-10-CM | POA: Diagnosis not present

## 2015-06-07 DIAGNOSIS — I10 Essential (primary) hypertension: Secondary | ICD-10-CM | POA: Diagnosis not present

## 2015-06-07 DIAGNOSIS — T783XXD Angioneurotic edema, subsequent encounter: Secondary | ICD-10-CM | POA: Diagnosis not present

## 2015-06-07 DIAGNOSIS — E785 Hyperlipidemia, unspecified: Secondary | ICD-10-CM | POA: Diagnosis not present

## 2015-06-11 ENCOUNTER — Encounter: Payer: Self-pay | Admitting: Internal Medicine

## 2015-06-11 ENCOUNTER — Ambulatory Visit (INDEPENDENT_AMBULATORY_CARE_PROVIDER_SITE_OTHER): Payer: Medicare Other | Admitting: Internal Medicine

## 2015-06-11 VITALS — BP 98/62 | HR 65 | Temp 97.2°F | Wt 203.0 lb

## 2015-06-11 DIAGNOSIS — Z794 Long term (current) use of insulin: Secondary | ICD-10-CM | POA: Diagnosis not present

## 2015-06-11 DIAGNOSIS — E785 Hyperlipidemia, unspecified: Secondary | ICD-10-CM | POA: Diagnosis not present

## 2015-06-11 DIAGNOSIS — I10 Essential (primary) hypertension: Secondary | ICD-10-CM

## 2015-06-11 DIAGNOSIS — W57XXXA Bitten or stung by nonvenomous insect and other nonvenomous arthropods, initial encounter: Secondary | ICD-10-CM | POA: Diagnosis not present

## 2015-06-11 DIAGNOSIS — T148 Other injury of unspecified body region: Secondary | ICD-10-CM

## 2015-06-11 DIAGNOSIS — E119 Type 2 diabetes mellitus without complications: Secondary | ICD-10-CM

## 2015-06-11 DIAGNOSIS — T63421A Toxic effect of venom of ants, accidental (unintentional), initial encounter: Secondary | ICD-10-CM

## 2015-06-11 DIAGNOSIS — F05 Delirium due to known physiological condition: Secondary | ICD-10-CM | POA: Diagnosis not present

## 2015-06-11 MED ORDER — METOPROLOL TARTRATE 50 MG PO TABS: 50.0000 mg | ORAL_TABLET | Freq: Two times a day (BID) | ORAL | Status: DC

## 2015-06-11 MED ORDER — LISINOPRIL 5 MG PO TABS: 5.0000 mg | ORAL_TABLET | Freq: Every day | ORAL | Status: DC

## 2015-06-11 NOTE — Patient Instructions (Signed)
Decrease metoprolol to 50 mg twice daily. Restart lisinopril 5 mg daily. Discontinue diuretic. Lab work will be done in one week when you are followed up here again. Call if any questions or complications. Continue same dose of insulin.

## 2015-06-11 NOTE — Progress Notes (Signed)
   Subjective:    Patient ID: Joseph Mora, male    DOB: [birth year], 81 y.o.   MRN: [id number]  HPI For hospital follow up s/p multiple insect stings from fire ants causing confusion. MRI showed some mild atrophy. No evidence of CVA. He was placed on acyclovir for possible herpetic encephalitis. He was confused quite a bit during the hospitalization and some since being discharged according to his wife who is with him today. At times his Accu-Cheks have been slightly over 200 but nothing over 280. His ACE inhibitor was discontinued thinking maybe caused angioedema apparently. I would like him to restart that at a lower dose. He was placed on metoprolol 75 mg twice daily in the hospital and had previously been on 50 mg twice daily. I am changing it back to 50 mg twice daily. He is hypotensive today.  He knows the year, had to be prompted regarding the month, Knows President of the [place].  Wife says that right arm had a tremor. They were concerned about Parkinsonism. Apparently he has developed shuffling gait. He has appointment see neurologist soon. Wife says at times he is been confused prior to hospitalization but this was much worse.    Review of Systems     Objective:   Physical Exam He is alert and oriented 3. He is quiet. Usually has much more to say. Seems to have some slight cogwheel rigidity. No tremor identified today. Chest clear. Cardiac exam regular rate and rhythm. Extremities without edema. No facial weakness.       Assessment & Plan:  Hypotension-decreased metoprolol to 50 mg twice daily from 75 mg twice daily  Diabetes mellitus insulin-dependent-continue to monitor. Will not add regular insulin at this time. Follow-up in one week.  Restart lisinopril at 5 mg daily  Confusion/and cephalopathy-to follow-up with neurologist regarding possible parkinsonism. Was started on oral B-12 for low normal B-12 level.  Hyperlipidemia-currently on statin medication  Plan:  Follow-up in one week. At that time he'll have magnesium, C met, CBC, B-12 level at that time. He is scheduled to get flu vaccine through the [facility] October 1

## 2015-06-12 DIAGNOSIS — I10 Essential (primary) hypertension: Secondary | ICD-10-CM | POA: Diagnosis not present

## 2015-06-12 DIAGNOSIS — E1165 Type 2 diabetes mellitus with hyperglycemia: Secondary | ICD-10-CM | POA: Diagnosis not present

## 2015-06-12 DIAGNOSIS — Z794 Long term (current) use of insulin: Secondary | ICD-10-CM | POA: Diagnosis not present

## 2015-06-12 DIAGNOSIS — E785 Hyperlipidemia, unspecified: Secondary | ICD-10-CM | POA: Diagnosis not present

## 2015-06-12 DIAGNOSIS — I2510 Atherosclerotic heart disease of native coronary artery without angina pectoris: Secondary | ICD-10-CM | POA: Diagnosis not present

## 2015-06-12 DIAGNOSIS — T783XXD Angioneurotic edema, subsequent encounter: Secondary | ICD-10-CM | POA: Diagnosis not present

## 2015-06-13 DIAGNOSIS — E1165 Type 2 diabetes mellitus with hyperglycemia: Secondary | ICD-10-CM | POA: Diagnosis not present

## 2015-06-13 DIAGNOSIS — I10 Essential (primary) hypertension: Secondary | ICD-10-CM | POA: Diagnosis not present

## 2015-06-13 DIAGNOSIS — I2510 Atherosclerotic heart disease of native coronary artery without angina pectoris: Secondary | ICD-10-CM | POA: Diagnosis not present

## 2015-06-14 DIAGNOSIS — I10 Essential (primary) hypertension: Secondary | ICD-10-CM | POA: Diagnosis not present

## 2015-06-14 DIAGNOSIS — E1165 Type 2 diabetes mellitus with hyperglycemia: Secondary | ICD-10-CM | POA: Diagnosis not present

## 2015-06-14 DIAGNOSIS — I2510 Atherosclerotic heart disease of native coronary artery without angina pectoris: Secondary | ICD-10-CM | POA: Diagnosis not present

## 2015-06-14 DIAGNOSIS — Z794 Long term (current) use of insulin: Secondary | ICD-10-CM | POA: Diagnosis not present

## 2015-06-14 DIAGNOSIS — E785 Hyperlipidemia, unspecified: Secondary | ICD-10-CM | POA: Diagnosis not present

## 2015-06-14 DIAGNOSIS — T783XXD Angioneurotic edema, subsequent encounter: Secondary | ICD-10-CM | POA: Diagnosis not present

## 2015-06-20 ENCOUNTER — Ambulatory Visit (INDEPENDENT_AMBULATORY_CARE_PROVIDER_SITE_OTHER): Payer: Medicare Other | Admitting: Internal Medicine

## 2015-06-20 ENCOUNTER — Other Ambulatory Visit: Payer: Self-pay | Admitting: Internal Medicine

## 2015-06-20 ENCOUNTER — Encounter: Payer: Self-pay | Admitting: Internal Medicine

## 2015-06-20 VITALS — BP 116/64 | HR 68 | Temp 97.9°F | Wt 195.0 lb

## 2015-06-20 DIAGNOSIS — R410 Disorientation, unspecified: Secondary | ICD-10-CM | POA: Diagnosis not present

## 2015-06-20 DIAGNOSIS — Z794 Long term (current) use of insulin: Secondary | ICD-10-CM | POA: Diagnosis not present

## 2015-06-20 DIAGNOSIS — R5383 Other fatigue: Secondary | ICD-10-CM | POA: Diagnosis not present

## 2015-06-20 DIAGNOSIS — IMO0001 Reserved for inherently not codable concepts without codable children: Secondary | ICD-10-CM

## 2015-06-20 DIAGNOSIS — R269 Unspecified abnormalities of gait and mobility: Secondary | ICD-10-CM

## 2015-06-20 DIAGNOSIS — E119 Type 2 diabetes mellitus without complications: Secondary | ICD-10-CM | POA: Diagnosis not present

## 2015-06-20 DIAGNOSIS — R779 Abnormality of plasma protein, unspecified: Secondary | ICD-10-CM | POA: Diagnosis not present

## 2015-06-20 LAB — CBC WITH DIFFERENTIAL/PLATELET
BASOS ABS: 0 10*3/uL (ref 0.0–0.1)
BASOS PCT: 0 % (ref 0–1)
EOS ABS: 0.2 10*3/uL (ref 0.0–0.7)
EOS PCT: 3 % (ref 0–5)
HCT: 46.5 % (ref 39.0–52.0)
Hemoglobin: 15.9 g/dL (ref 13.0–17.0)
Lymphocytes Relative: 27 % (ref 12–46)
Lymphs Abs: 1.8 10*3/uL (ref 0.7–4.0)
MCH: 32.1 pg (ref 26.0–34.0)
MCHC: 34.2 g/dL (ref 30.0–36.0)
MCV: 93.8 fL (ref 78.0–100.0)
MPV: 10.6 fL (ref 8.6–12.4)
Monocytes Absolute: 0.5 10*3/uL (ref 0.1–1.0)
Monocytes Relative: 8 % (ref 3–12)
Neutro Abs: 4.2 10*3/uL (ref 1.7–7.7)
Neutrophils Relative %: 62 % (ref 43–77)
PLATELETS: 127 10*3/uL — AB (ref 150–400)
RBC: 4.96 MIL/uL (ref 4.22–5.81)
RDW: 14.2 % (ref 11.5–15.5)
WBC: 6.8 10*3/uL (ref 4.0–10.5)

## 2015-06-20 LAB — COMPREHENSIVE METABOLIC PANEL
ALK PHOS: 74 U/L (ref 40–115)
ALT: 22 U/L (ref 9–46)
AST: 18 U/L (ref 10–35)
Albumin: 3.4 g/dL — ABNORMAL LOW (ref 3.6–5.1)
BILIRUBIN TOTAL: 0.6 mg/dL (ref 0.2–1.2)
BUN: 13 mg/dL (ref 7–25)
CO2: 31 mmol/L (ref 20–31)
Calcium: 8.7 mg/dL (ref 8.6–10.3)
Chloride: 103 mmol/L (ref 98–110)
Creat: 1 mg/dL (ref 0.70–1.11)
GLUCOSE: 71 mg/dL (ref 65–99)
Potassium: 4.1 mmol/L (ref 3.5–5.3)
Sodium: 139 mmol/L (ref 135–146)
Total Protein: 5.8 g/dL — ABNORMAL LOW (ref 6.1–8.1)

## 2015-06-20 LAB — MAGNESIUM: Magnesium: 2.1 mg/dL (ref 1.5–2.5)

## 2015-06-20 MED ORDER — INSULIN REGULAR HUMAN 100 UNIT/ML IJ SOLN: 10.0000 [IU] | Freq: Every day | INTRAMUSCULAR | Status: DC

## 2015-06-20 NOTE — Patient Instructions (Signed)
Start regular insulin 10 units every morning. Call with Accu-Chek reports next week. Follow-up with cardiologist and neurologist.

## 2015-06-20 NOTE — Progress Notes (Signed)
   Subjective:    Patient ID: Joseph Mora, male    DOB: [birth year], 81 y.o.   MRN: [id number]  HPI He has appointment with neurologist this coming Friday as well as cardiologist. Doesn't feel that he is getting along too well. Has some difficulty ambulating. Lack of energy. Accu-Cheks are running high before supper. Wife says that he is not eating correctly. Says sometimes his feet not seem to want to work correctly. Other times he feels he walks okay. Will be evaluated for possible Parkinson's disease by  neurologist.  Had flu vaccine through the [facility] yesterday.  I do think he had a severe anaphylactic reaction due to fire ants and possibly could have had a serum sickness-like illness. I think he will improve with time. However I'm concerned he could have Parkinson's disease based on difficulty ambulating.   Review of Systems     Objective:   Physical Exam He is alert and oriented 3 today. He ambulates with a cane. Chest clear. Cardiac exam regular rate and rhythm. Extremities without edema.      Assessment & Plan:  Insulin-dependent diabetes-running high 300-500 before supper at times. Accu-Cheks are always good in the mornings. He will start Humulin R 10 units every morning and continue same dose of Humulin N every morning and every afternoon. Wife will call me with Accu-Chek reports next week. Apparently he's not followed by endocrinologist any longer. He became aggravated when he had to wait in office to see the endocrinologist. We will see if we can manage his diabetes here.

## 2015-06-20 NOTE — Addendum Note (Signed)
Addended by: Leota Jacobsen on: 06/20/2015 11:24 AM   Modules accepted: Orders

## 2015-06-21 LAB — PREALBUMIN: Prealbumin: 21 mg/dL (ref 21–43)

## 2015-06-21 LAB — VITAMIN B12: Vitamin B-12: 504 pg/mL (ref 211–911)

## 2015-06-22 ENCOUNTER — Encounter: Payer: Self-pay | Admitting: Physician Assistant

## 2015-06-22 ENCOUNTER — Ambulatory Visit (INDEPENDENT_AMBULATORY_CARE_PROVIDER_SITE_OTHER): Payer: Medicare Other | Admitting: Physician Assistant

## 2015-06-22 ENCOUNTER — Telehealth: Payer: Self-pay | Admitting: *Deleted

## 2015-06-22 ENCOUNTER — Ambulatory Visit (INDEPENDENT_AMBULATORY_CARE_PROVIDER_SITE_OTHER): Payer: Medicare Other | Admitting: Neurology

## 2015-06-22 ENCOUNTER — Encounter: Payer: Self-pay | Admitting: Neurology

## 2015-06-22 VITALS — BP 110/60 | HR 78 | Ht 65.0 in | Wt 199.0 lb

## 2015-06-22 VITALS — BP 156/72 | HR 96 | Ht 65.0 in | Wt 197.5 lb

## 2015-06-22 DIAGNOSIS — I10 Essential (primary) hypertension: Secondary | ICD-10-CM

## 2015-06-22 DIAGNOSIS — R251 Tremor, unspecified: Secondary | ICD-10-CM

## 2015-06-22 DIAGNOSIS — I493 Ventricular premature depolarization: Secondary | ICD-10-CM

## 2015-06-22 DIAGNOSIS — R413 Other amnesia: Secondary | ICD-10-CM

## 2015-06-22 HISTORY — DX: Tremor, unspecified: R25.1

## 2015-06-22 HISTORY — DX: Other amnesia: R41.3

## 2015-06-22 LAB — HEMOGLOBIN A1C
HEMOGLOBIN A1C: 8.5 % — AB (ref <5.7)
MEAN PLASMA GLUCOSE: 197 mg/dL — AB (ref <117)

## 2015-06-22 NOTE — Addendum Note (Signed)
Addended by: Freada Bergeron on: 06/22/2015 05:42 PM   Modules accepted: Orders

## 2015-06-22 NOTE — Patient Instructions (Signed)

## 2015-06-22 NOTE — Patient Instructions (Signed)
Medication Instructions:  Your physician recommends that you continue on your current medications as directed. Please refer to the Current Medication list given to you today.   Labwork: None ordered  Testing/Procedures: None ordered  Follow-Up: Your physician wants you to follow-up in: [place].  You will receive a reminder letter in the mail two months in advance. If you don't receive a letter, please call our office to schedule the follow-up appointment.

## 2015-06-22 NOTE — Progress Notes (Signed)
Patient ID: Joseph Mora, male   DOB: [birth year], 81 y.o.   MRN: [id number]    Date:  06/22/2015   ID:  Joseph Mora, DOB 1933-03-30, MRN [id number]  PCP:  Elby Showers, MD  Primary Cardiologist:  Angelena Form   Chief complaint: Post hospital follow-up for PVCs   History of Present Illness: Joseph Mora is a 81 y.o. male with history of coronary disease, MI, hyperlipidemia, diabetes mellitus, tremor, arthritis.  He has stent placed to the circumflex in 1990. He had a stent placed to the RCA in 2000. His most recent stress test was July 2014 with normal LV function and no ischemia.  Patient was admitted 05/28/2015 is 06/01/2015 with acute encephalopathy, angioedema and acute kidney injury.  He apparently got bit by a nest of fire ants.   Was having quite a few PVCs and was asked to follow-up with cardiology. His Lopressor dose was increased at that time.    The patient currently denies nausea, vomiting, fever, chest pain, shortness of breath, orthopnea, dizziness, PND, cough, congestion, abdominal pain, hematochezia, melena,  claudication.  Wt Readings from Last 3 Encounters:  06/22/15 199 lb (90.266 kg)  06/22/15 197 lb 8 oz (89.585 kg)  06/20/15 195 lb (88.451 kg)     Past Medical History  Diagnosis Date  . CAD (coronary artery disease)   . Hyperlipidemia   . Diabetes mellitus   . Myocardial infarction   . Arthritis     RA  . Memory difficulty 06/22/2015  . Tremor 06/22/2015    Intentional, right hand    Current Outpatient Prescriptions  Medication Sig Dispense Refill  . aspirin 81 MG tablet Take 81 mg by mouth daily.    Marland Kitchen atorvastatin (LIPITOR) 40 MG tablet TAKE 1 TABLET BY MOUTH DAILY 30 tablet 0  . doxazosin (CARDURA) 2 MG tablet Take 2 mg by mouth daily.     . insulin NPH Human (HUMULIN N) 100 UNIT/ML injection 40units subq before breakfast and 15units before dinner (Patient taking differently: 60 units subq before breakfast and 30 units before dinner) 30 mL prn  .  insulin regular (HUMULIN R) 100 units/mL injection Inject 0.1 mLs (10 Units total) into the skin daily before breakfast. 10 mL 11  . Insulin Syringe-Needle U-100 (INSULIN SYRINGE 1CC/30GX5/16") 30G X 5/16" 1 ML MISC USE TWICE DAILY OR AS DIRECTED 100 each 0  . lisinopril (PRINIVIL,ZESTRIL) 5 MG tablet Take 1 tablet (5 mg total) by mouth daily. 30 tablet 0  . metoprolol (LOPRESSOR) 50 MG tablet Take 1 tablet (50 mg total) by mouth 2 (two) times daily. 180 tablet 3  . nitroGLYCERIN (NITROSTAT) 0.4 MG SL tablet Place 1 tablet (0.4 mg total) under the tongue every 5 (five) minutes as needed for chest pain. 25 tablet 6  . tamsulosin (FLOMAX) 0.4 MG CAPS capsule TAKE ONE CAPSULE BY MOUTH EVERY DAY 30 capsule 11  . vitamin B-12 100 MCG tablet Take 1 tablet (100 mcg total) by mouth daily. 30 tablet 0   No current facility-administered medications for this visit.    Allergies:    Allergies  Allergen Reactions  . Morphine And Related Itching  . Penicillins Hives    Social History:  The patient  reports that he quit smoking about 4 years ago. His smokeless tobacco use includes Chew. He reports that he does not drink alcohol or use illicit drugs.   Family history:   Family History  Problem Relation Age of Onset  . Heart attack Paternal  24   . Stroke Mother   . Diabetes Mother   . Dementia Mother   . Diabetes Sister   . Stroke Sister   . Dementia Sister   . Diabetes Brother   . Dementia Brother   . Hypertension Neg Hx     ROS:  Please see the history of present illness.  All other systems reviewed and negative.   PHYSICAL EXAM: VS:  BP 110/60 mmHg  Pulse 78  Ht 5\' 5"  (1.651 m)  Wt 199 lb (90.266 kg)  BMI 33.12 kg/m2  SpO2 95% Obese, well developed, in no acute distress HEENT: Pupils are equal round react to light accommodation extraocular movements are intact.  Neck: no JVDNo cervical lymphadenopathy. Cardiac: Regular rate and rhythm without murmurs rubs or gallops. Lungs:  clear  to auscultation bilaterally, no wheezing, rhonchi or rales Abd: soft, nontender, positive bowel sounds all quadrants, no hepatosplenomegaly Ext: 1+ lower extremity edema.  2+ radial and dorsalis pedis pulses. Skin: warm and dry Neuro:  Grossly normal  EKG:    Right bundle branch block rate 76 bpm 1 PVC on this EKG  ASSESSMENT AND PLAN:  Problem List Items Addressed This Visit    Premature ventricular contractions - Primary   Essential hypertension     PVCs Lopressor was increased to 50 mg twice daily while he was in the hospital. He now has infrequent PVCs with one showing on his EKG. Blood pressure is very well controlled. Continue current dosing.  Coronary artery disease  Complaints of chest pain.  Essential hypertension Well-controlled no changes to medications.  Lower extremity edema Be related to venous insufficiency.  He does have grade 1 diastolic dysfunction. I offered compression socks. May need diuretic later on

## 2015-06-22 NOTE — Progress Notes (Signed)
Reason for visit: Tremor, memory disorder  Referring physician: [facility] [name] is a 81 y.o. male  History of present illness:  [name] is a 81 year old right-handed white male with a history of a memory disturbance that dates back at least 6 months. The patient has had increasing problems with short-term memory, he has difficulty remembering names for people and things. He has had problems with repeating himself frequently, and misplacing things about the house. He gave up driving about 6 months ago because of difficulty with directions. He has had to have assistance with keeping up with medications and appointments. The patient reports that he is sleeping well at night, but he has some problems with acting out his dreams, he occasionally will fall out of bed. Within the last several months, he has also developed a tremor that seems to affect mainly the right arm. The patient himself notes the tremor when he is trying to use the arm with feeding or handwriting. The patient uses a cane for ambulation, he has been using a cane for 2 or 3 years. He does have some occasional urinary incontinence and urinary urgency. He denies any family history of tremor. The patient reports some burning and stinging in the feet at night. He denies weakness of the extremities, he has not had any dizziness or blackout episodes. He recently was in the hospital around 05/28/2015 with angioedema. He was encephalopathic at that time, MRI of the brain was done showing minimal small vessel disease, some cortical atrophy was seen. Blood work was done that includes a thyroid profile, ammonia level, vitamin B12 level, HIV, and thiamine level. These studies were unremarkable. He is sent to this office for an evaluation.    Past Medical History  Diagnosis Date  . CAD (coronary artery disease)   . Hyperlipidemia   . Diabetes mellitus   . Myocardial infarction   . Arthritis     RA  . Memory difficulty  06/22/2015  . Tremor 06/22/2015    Intentional, right hand    Past Surgical History  Procedure Laterality Date  . Carpal tunnel release Bilateral   . Parathyroidectomy    . Cataract extraction Bilateral   . Inguinal hernia repair    . Back surgery      Family History  Problem Relation Age of Onset  . Heart attack Paternal Aunt   . Stroke Mother   . Diabetes Mother   . Dementia Mother   . Diabetes Sister   . Stroke Sister   . Dementia Sister   . Diabetes Brother   . Dementia Brother   . Hypertension Neg Hx     Social history:  reports that he quit smoking about 4 years ago. His smokeless tobacco use includes Chew. He reports that he does not drink alcohol or use illicit drugs.  Medications:  Prior to Admission medications   Medication Sig Start Date End Date Taking? Authorizing Provider  aspirin 81 MG tablet Take 81 mg by mouth daily. 10/01/11  Yes Burnell Blanks, MD  atorvastatin (LIPITOR) 40 MG tablet TAKE 1 TABLET BY MOUTH DAILY 05/14/15  Yes Elby Showers, MD  doxazosin (CARDURA) 2 MG tablet  06/17/15  Yes Historical Provider, MD  insulin NPH Human (HUMULIN N) 100 UNIT/ML injection 40units subq before breakfast and 15units before dinner Patient taking differently: 60 units subq before breakfast and 30 units before dinner 06/01/15  Yes Florencia Reasons, MD  insulin regular (HUMULIN R) 100 units/mL  injection Inject 0.1 mLs (10 Units total) into the skin daily before breakfast. 06/20/15  Yes Elby Showers, MD  Insulin Syringe-Needle U-100 (INSULIN SYRINGE 1CC/30GX5/16") 30G X 5/16" 1 ML MISC USE TWICE DAILY OR AS DIRECTED 03/16/15  Yes Elby Showers, MD  lisinopril (PRINIVIL,ZESTRIL) 5 MG tablet Take 1 tablet (5 mg total) by mouth daily. 06/11/15  Yes Elby Showers, MD  metoprolol (LOPRESSOR) 50 MG tablet Take 1 tablet (50 mg total) by mouth 2 (two) times daily. 06/11/15  Yes Elby Showers, MD  nitroGLYCERIN (NITROSTAT) 0.4 MG SL tablet Place 1 tablet (0.4 mg total) under the tongue  every 5 (five) minutes as needed for chest pain. 03/31/14  Yes Burnell Blanks, MD  tamsulosin (FLOMAX) 0.4 MG CAPS capsule TAKE ONE CAPSULE BY MOUTH EVERY DAY 02/22/15  Yes Elby Showers, MD  vitamin B-12 100 MCG tablet Take 1 tablet (100 mcg total) by mouth daily. 06/01/15  Yes Florencia Reasons, MD      Allergies  Allergen Reactions  . Morphine And Related Itching  . Penicillins Hives    ROS:  Out of a complete 14 system review of symptoms, the patient complains only of the following symptoms, and all other reviewed systems are negative.  Swelling in the legs Hearing loss Urinary incontinence, impotence, urination problems Feeling cold Joint pain Memory loss, confusion, tremor  Blood pressure 156/72, pulse 96, height 5\' 5"  (1.651 m), weight 197 lb 8 oz (89.585 kg).  Physical Exam  General: The patient is alert and cooperative at the time of the examination.  Eyes: Pupils are equal, round, and reactive to light. Discs are flat bilaterally.  Neck: The neck is supple, no carotid bruits are noted.  Respiratory: The respiratory examination is clear.  Cardiovascular: The cardiovascular examination reveals a regular rate and rhythm, no obvious murmurs or rubs are noted.  Skin: Extremities are with 1-2+ edema below the knees bilaterally.  Neurologic Exam  Mental status: The patient is alert and oriented x 2 at the time of the examination (the patient is not oriented to date). The Mini-Mental Status Examination done today shows a total score of 18/30. The patient is able to name 6 animals in 30 seconds.  Cranial nerves: Facial symmetry is present. There is good sensation of the face to pinprick and soft touch bilaterally. The strength of the facial muscles and the muscles to head turning and shoulder shrug are normal bilaterally. Speech is well enunciated, no aphasia or dysarthria is noted. Extraocular movements are full. Visual fields are full. The tongue is midline, and the patient has  symmetric elevation of the soft palate. No obvious hearing deficits are noted.  Motor: The motor testing reveals 5 over 5 strength of all 4 extremities. Good symmetric motor tone is noted throughout.  Sensory: Sensory testing is intact to pinprick, soft touch, vibration sensation, and position sense on all 4 extremities, with exception that there is a stocking pattern pinprick sensory deficit one half way up the legs bilaterally and position sense seems to be depressed on the right greater than left foot. No evidence of extinction is noted.  Coordination: Cerebellar testing reveals good finger-nose-finger and heel-to-shin bilaterally. An intention tremor is seen with finger-nose-finger bilaterally. A resting tremor seen with right upper extremity.  Gait and station: The patient is able to rise from a seated position with arms crossed. He usually uses a cane for ambulation. Gait is wide-based. Tandem gait is unsteady. Romberg is negative. No drift is  seen.  Reflexes: Deep tendon reflexes are symmetric, but are depressed bilaterally. Toes are downgoing bilaterally.   MRI brain 06/01/15:  IMPRESSION: 1. No acute intracranial abnormality. 2. Mild to moderate for age nonspecific signal changes, most commonly due to chronic small vessel disease.  * MRI scan images were reviewed online. I agree with the written report.    Assessment/Plan:   1. Memory disturbance  2. Resting tremor, right upper extremity  3. Intention tremor, bilateral upper extremities  4. Gait disorder  5. Diabetes  The patient likely has a diabetic peripheral neuropathy, he appears to have a wide-based gait associated with this. He appears to have 2 different types of tremors, he has a true resting tremor of the right upper extremity, mild masking of the face. He may be developing signs of Parkinson's disease, he will need to be followed for this. He also has a true intention tremor bilaterally that could be secondary  to a benign essential tremor. The patient has a memory disturbance, I have offered him treatment with Aricept at this time, but he does not wish to start a new medication at this point. He will follow-up in 5 or 6 months. We will follow him for possible developing Parkinson's disease.  Jill Alexanders MD 06/22/2015 5:07 PM  [facility] [address] [place], [place] [address]  Phone [phone] Fax [phone]

## 2015-06-26 NOTE — Telephone Encounter (Signed)
Spoke with patient wife reviewed recent lab results. She states his FSBS still running above 2oo in the evenings she will call back Thursday with exact readings. She also states Neurologist suggested Aricept but he didn't want it at that time but now has changed his mind. She wants to know if we will order this. Do you want to see them for OV to address this and his elevated blood sugars? Please advise

## 2015-06-26 NOTE — Telephone Encounter (Signed)
I can prescribe Aricept 5 mg daily. We need exact times and readings on accuchecks to adjust medication

## 2015-06-27 MED ORDER — DONEPEZIL HCL 5 MG PO TABS: 5.0000 mg | ORAL_TABLET | Freq: Every day | ORAL | Status: DC

## 2015-06-27 NOTE — Telephone Encounter (Signed)
Rx for Aricept sent to patient pharmacy

## 2015-06-28 MED ORDER — INSULIN REGULAR HUMAN 100 UNIT/ML IJ SOLN: INTRAMUSCULAR | Status: DC

## 2015-06-28 NOTE — Telephone Encounter (Signed)
                       Spoke with patient wife gave her instructions for Insulin dosing as per Dr Renold Genta. Patient wife will call us with FSBS readings next week or sooner if patient runs too low with increased dose of Regular Insulin

## 2015-06-28 NOTE — Telephone Encounter (Signed)
Increase Humulin R to 20 units every am and keep taking 10 units before supper. Speak with Mrs. Dicamillo please. Call withh accuchecks next week

## 2015-06-28 NOTE — Telephone Encounter (Signed)
Wife is calling to provide blood sugar readings for 3 times per day.  These readings are all prior to his meals.    9/30 6:30 (90) 1:00 (226) 4:00 (266) 10/1 6:00 (80) 12:30 (228) 5:00 (289) 10/2 7:00 (108) 2:30 (329) 6:30 (388) 10/3 6:30 (106) Didn't do 5:00 (244) 10/4 7:45 (100)  10/5 6:30 (89) 12:30 (378) 5:30 (338) 10/6  6:45 (100) Didn't do Has not done yet this evening  Please advise.

## 2015-07-10 ENCOUNTER — Telehealth: Payer: Self-pay

## 2015-07-10 NOTE — Telephone Encounter (Signed)
Patient wife called with glucose readings: 07/02/2015 7am 100, lunch 338, 5p 355 07/03/2015 7am 164, 6p 132 07/04/2015 lunch 116, 5pm 298 07/05/2015 7am 120, 130p 114, 530p 287 07/06/2015 7am 87, 5pm 327 07/07/2015 8am 181, 430p 150 07/08/2015 7am 161, 230p 298, 630p 180 07/09/2015 7am 153, 5p 258 07/10/2015 730am 109, 1p 158 Please advise.

## 2015-07-10 NOTE — Telephone Encounter (Signed)
Spoke with Mrs. Joseph Mora. She will increase Humulin R to 25 Units before breakfast and 15 Units Humulin R before supper. Continue Humulin N as previously ordered. [name] is snacking a lot throughout the day.

## 2015-07-16 ENCOUNTER — Other Ambulatory Visit: Payer: Self-pay | Admitting: Internal Medicine

## 2015-07-18 DIAGNOSIS — G9340 Encephalopathy, unspecified: Secondary | ICD-10-CM

## 2015-07-18 DIAGNOSIS — R410 Disorientation, unspecified: Secondary | ICD-10-CM

## 2015-08-15 ENCOUNTER — Other Ambulatory Visit: Payer: Self-pay | Admitting: Internal Medicine

## 2015-09-04 DIAGNOSIS — E109 Type 1 diabetes mellitus without complications: Secondary | ICD-10-CM | POA: Diagnosis not present

## 2015-09-11 ENCOUNTER — Other Ambulatory Visit: Payer: Self-pay | Admitting: Internal Medicine

## 2015-10-14 ENCOUNTER — Emergency Department (HOSPITAL_COMMUNITY): Payer: PPO

## 2015-10-14 ENCOUNTER — Encounter (HOSPITAL_COMMUNITY): Payer: Self-pay | Admitting: *Deleted

## 2015-10-14 ENCOUNTER — Inpatient Hospital Stay (HOSPITAL_COMMUNITY)
Admission: EM | Admit: 2015-10-14 | Discharge: 2015-10-16 | DRG: 637 | Disposition: A | Discharge status: Home Health | Payer: PPO | Attending: Internal Medicine | Admitting: Internal Medicine

## 2015-10-14 DIAGNOSIS — M069 Rheumatoid arthritis, unspecified: Secondary | ICD-10-CM | POA: Diagnosis not present

## 2015-10-14 DIAGNOSIS — E161 Other hypoglycemia: Secondary | ICD-10-CM | POA: Diagnosis not present

## 2015-10-14 DIAGNOSIS — I10 Essential (primary) hypertension: Secondary | ICD-10-CM | POA: Diagnosis not present

## 2015-10-14 DIAGNOSIS — I252 Old myocardial infarction: Secondary | ICD-10-CM | POA: Diagnosis not present

## 2015-10-14 DIAGNOSIS — IMO0001 Reserved for inherently not codable concepts without codable children: Secondary | ICD-10-CM

## 2015-10-14 DIAGNOSIS — Z79899 Other long term (current) drug therapy: Secondary | ICD-10-CM | POA: Diagnosis not present

## 2015-10-14 DIAGNOSIS — G9340 Encephalopathy, unspecified: Secondary | ICD-10-CM | POA: Diagnosis present

## 2015-10-14 DIAGNOSIS — I2510 Atherosclerotic heart disease of native coronary artery without angina pectoris: Secondary | ICD-10-CM | POA: Diagnosis not present

## 2015-10-14 DIAGNOSIS — I959 Hypotension, unspecified: Secondary | ICD-10-CM | POA: Diagnosis not present

## 2015-10-14 DIAGNOSIS — T68XXXA Hypothermia, initial encounter: Secondary | ICD-10-CM | POA: Diagnosis not present

## 2015-10-14 DIAGNOSIS — Z885 Allergy status to narcotic agent status: Secondary | ICD-10-CM

## 2015-10-14 DIAGNOSIS — R7309 Other abnormal glucose: Secondary | ICD-10-CM | POA: Diagnosis not present

## 2015-10-14 DIAGNOSIS — Z7982 Long term (current) use of aspirin: Secondary | ICD-10-CM | POA: Diagnosis not present

## 2015-10-14 DIAGNOSIS — R251 Tremor, unspecified: Secondary | ICD-10-CM | POA: Diagnosis present

## 2015-10-14 DIAGNOSIS — Z880 Allergy status to penicillin: Secondary | ICD-10-CM | POA: Diagnosis not present

## 2015-10-14 DIAGNOSIS — R680 Hypothermia, not associated with low environmental temperature: Secondary | ICD-10-CM | POA: Diagnosis present

## 2015-10-14 DIAGNOSIS — I4510 Unspecified right bundle-branch block: Secondary | ICD-10-CM | POA: Diagnosis not present

## 2015-10-14 DIAGNOSIS — I440 Atrioventricular block, first degree: Secondary | ICD-10-CM | POA: Diagnosis not present

## 2015-10-14 DIAGNOSIS — E785 Hyperlipidemia, unspecified: Secondary | ICD-10-CM | POA: Diagnosis present

## 2015-10-14 DIAGNOSIS — Z794 Long term (current) use of insulin: Secondary | ICD-10-CM

## 2015-10-14 DIAGNOSIS — Z87891 Personal history of nicotine dependence: Secondary | ICD-10-CM | POA: Diagnosis not present

## 2015-10-14 DIAGNOSIS — E11649 Type 2 diabetes mellitus with hypoglycemia without coma: Secondary | ICD-10-CM | POA: Diagnosis not present

## 2015-10-14 DIAGNOSIS — E162 Hypoglycemia, unspecified: Secondary | ICD-10-CM

## 2015-10-14 DIAGNOSIS — R4182 Altered mental status, unspecified: Secondary | ICD-10-CM | POA: Diagnosis not present

## 2015-10-14 DIAGNOSIS — R55 Syncope and collapse: Secondary | ICD-10-CM | POA: Diagnosis not present

## 2015-10-14 DIAGNOSIS — E119 Type 2 diabetes mellitus without complications: Secondary | ICD-10-CM

## 2015-10-14 LAB — COMPREHENSIVE METABOLIC PANEL
ALT: 18 U/L (ref 17–63)
AST: 30 U/L (ref 15–41)
Albumin: 3.1 g/dL — ABNORMAL LOW (ref 3.5–5.0)
Alkaline Phosphatase: 69 U/L (ref 38–126)
Anion gap: 10 (ref 5–15)
BILIRUBIN TOTAL: 1 mg/dL (ref 0.3–1.2)
BUN: 11 mg/dL (ref 6–20)
CO2: 26 mmol/L (ref 22–32)
CREATININE: 1.2 mg/dL (ref 0.61–1.24)
Calcium: 8.7 mg/dL — ABNORMAL LOW (ref 8.9–10.3)
Chloride: 106 mmol/L (ref 101–111)
GFR, EST NON AFRICAN AMERICAN: 55 mL/min — AB (ref >60)
Glucose, Bld: 111 mg/dL — ABNORMAL HIGH (ref 65–99)
POTASSIUM: 3.8 mmol/L (ref 3.5–5.1)
Sodium: 142 mmol/L (ref 135–145)
TOTAL PROTEIN: 5.9 g/dL — AB (ref 6.5–8.1)

## 2015-10-14 LAB — CBC WITH DIFFERENTIAL/PLATELET
BASOS ABS: 0 10*3/uL (ref 0.0–0.1)
Basophils Relative: 0 %
Eosinophils Absolute: 0 10*3/uL (ref 0.0–0.7)
Eosinophils Relative: 0 %
HEMATOCRIT: 48.5 % (ref 39.0–52.0)
Hemoglobin: 16.4 g/dL (ref 13.0–17.0)
LYMPHS ABS: 0.7 10*3/uL (ref 0.7–4.0)
LYMPHS PCT: 6 %
MCH: 32.3 pg (ref 26.0–34.0)
MCHC: 33.8 g/dL (ref 30.0–36.0)
MCV: 95.5 fL (ref 78.0–100.0)
MONO ABS: 0.9 10*3/uL (ref 0.1–1.0)
MONOS PCT: 9 %
NEUTROS ABS: 9.1 10*3/uL — AB (ref 1.7–7.7)
Neutrophils Relative %: 85 %
Platelets: 110 10*3/uL — ABNORMAL LOW (ref 150–400)
RBC: 5.08 MIL/uL (ref 4.22–5.81)
RDW: 13.1 % (ref 11.5–15.5)
WBC: 10.7 10*3/uL — ABNORMAL HIGH (ref 4.0–10.5)

## 2015-10-14 LAB — GLUCOSE, CAPILLARY
GLUCOSE-CAPILLARY: 196 mg/dL — AB (ref 65–99)
Glucose-Capillary: 159 mg/dL — ABNORMAL HIGH (ref 65–99)

## 2015-10-14 LAB — PROTIME-INR
INR: 1.19 (ref 0.00–1.49)
Prothrombin Time: 15.2 seconds (ref 11.6–15.2)

## 2015-10-14 LAB — I-STAT CG4 LACTIC ACID, ED
LACTIC ACID, VENOUS: 1.02 mmol/L (ref 0.5–2.0)
Lactic Acid, Venous: 2.4 mmol/L (ref 0.5–2.0)

## 2015-10-14 LAB — URINALYSIS, ROUTINE W REFLEX MICROSCOPIC
BILIRUBIN URINE: NEGATIVE
Glucose, UA: 100 mg/dL — AB
HGB URINE DIPSTICK: NEGATIVE
Ketones, ur: NEGATIVE mg/dL
Leukocytes, UA: NEGATIVE
Nitrite: NEGATIVE
PH: 6 (ref 5.0–8.0)
Protein, ur: NEGATIVE mg/dL
SPECIFIC GRAVITY, URINE: 1.014 (ref 1.005–1.030)

## 2015-10-14 LAB — I-STAT TROPONIN, ED: TROPONIN I, POC: 0.02 ng/mL (ref 0.00–0.08)

## 2015-10-14 LAB — CBG MONITORING, ED
GLUCOSE-CAPILLARY: 124 mg/dL — AB (ref 65–99)
Glucose-Capillary: 96 mg/dL (ref 65–99)
Glucose-Capillary: 102 mg/dL — ABNORMAL HIGH (ref 65–99)

## 2015-10-14 LAB — TROPONIN I: Troponin I: 0.03 ng/mL (ref <0.031)

## 2015-10-14 MED ORDER — ENOXAPARIN SODIUM 40 MG/0.4ML ~~LOC~~ SOLN
40.0000 mg | SUBCUTANEOUS | Status: DC
  Administered 2015-10-14: 40 mg via SUBCUTANEOUS
  Filled 2015-10-14: qty 0.4

## 2015-10-14 MED ORDER — ACETAMINOPHEN 650 MG RE SUPP: 650.0000 mg | Freq: Four times a day (QID) | RECTAL | Status: DC | PRN

## 2015-10-14 MED ORDER — SODIUM CHLORIDE 0.9 % IV SOLN
1000.0000 mL | INTRAVENOUS | Status: DC
  Administered 2015-10-14 (×2): 1000 mL via INTRAVENOUS

## 2015-10-14 MED ORDER — POLYETHYLENE GLYCOL 3350 17 G PO PACK: 17.0000 g | PACK | Freq: Every day | ORAL | Status: DC | PRN

## 2015-10-14 MED ORDER — ATORVASTATIN CALCIUM 40 MG PO TABS
40.0000 mg | ORAL_TABLET | Freq: Every day | ORAL | Status: DC
  Administered 2015-10-14 – 2015-10-16 (×3): 40 mg via ORAL
  Filled 2015-10-14 (×3): qty 1

## 2015-10-14 MED ORDER — INSULIN ASPART 100 UNIT/ML ~~LOC~~ SOLN
0.0000 [IU] | Freq: Three times a day (TID) | SUBCUTANEOUS | Status: DC
  Administered 2015-10-14 – 2015-10-15 (×2): 2 [IU] via SUBCUTANEOUS

## 2015-10-14 MED ORDER — VANCOMYCIN HCL 10 G IV SOLR
2000.0000 mg | Freq: Once | INTRAVENOUS | Status: DC
  Filled 2015-10-14: qty 2000

## 2015-10-14 MED ORDER — ACETAMINOPHEN 325 MG PO TABS
650.0000 mg | ORAL_TABLET | Freq: Four times a day (QID) | ORAL | Status: DC | PRN
  Administered 2015-10-15: 650 mg via ORAL
  Filled 2015-10-14: qty 2

## 2015-10-14 MED ORDER — SODIUM CHLORIDE 0.9 % IV BOLUS (SEPSIS)
1000.0000 mL | INTRAVENOUS | Status: AC
  Administered 2015-10-14 (×2): 1000 mL via INTRAVENOUS

## 2015-10-14 MED ORDER — LEVOFLOXACIN IN D5W 750 MG/150ML IV SOLN
750.0000 mg | Freq: Once | INTRAVENOUS | Status: AC
  Administered 2015-10-14: 750 mg via INTRAVENOUS
  Filled 2015-10-14: qty 150

## 2015-10-14 MED ORDER — DEXTROSE 5 % IV SOLN: 2.0000 g | Freq: Once | INTRAVENOUS | Status: DC

## 2015-10-14 MED ORDER — LEVOFLOXACIN IN D5W 750 MG/150ML IV SOLN: 750.0000 mg | INTRAVENOUS | Status: DC

## 2015-10-14 MED ORDER — DEXTROSE 5 % IV SOLN
2.0000 g | Freq: Three times a day (TID) | INTRAVENOUS | Status: DC
  Administered 2015-10-14 – 2015-10-15 (×2): 2 g via INTRAVENOUS
  Filled 2015-10-14 (×7): qty 2

## 2015-10-14 MED ORDER — ONDANSETRON HCL 4 MG PO TABS: 4.0000 mg | ORAL_TABLET | Freq: Four times a day (QID) | ORAL | Status: DC | PRN

## 2015-10-14 MED ORDER — ONDANSETRON HCL 4 MG/2ML IJ SOLN: 4.0000 mg | Freq: Four times a day (QID) | INTRAMUSCULAR | Status: DC | PRN

## 2015-10-14 MED ORDER — ASPIRIN 81 MG PO CHEW
81.0000 mg | CHEWABLE_TABLET | Freq: Every day | ORAL | Status: DC
  Administered 2015-10-15 – 2015-10-16 (×2): 81 mg via ORAL
  Filled 2015-10-14 (×2): qty 1

## 2015-10-14 MED ORDER — ASPIRIN 81 MG PO TABS: 81.0000 mg | ORAL_TABLET | Freq: Every day | ORAL | Status: DC

## 2015-10-14 MED ORDER — VANCOMYCIN HCL IN DEXTROSE 1-5 GM/200ML-% IV SOLN: 1000.0000 mg | Freq: Once | INTRAVENOUS | Status: DC

## 2015-10-14 MED ORDER — METOPROLOL TARTRATE 12.5 MG HALF TABLET
12.5000 mg | ORAL_TABLET | Freq: Two times a day (BID) | ORAL | Status: DC
  Administered 2015-10-14 – 2015-10-16 (×4): 12.5 mg via ORAL
  Filled 2015-10-14 (×4): qty 1

## 2015-10-14 MED ORDER — VANCOMYCIN HCL IN DEXTROSE 750-5 MG/150ML-% IV SOLN
750.0000 mg | Freq: Two times a day (BID) | INTRAVENOUS | Status: DC
  Administered 2015-10-14 – 2015-10-16 (×4): 750 mg via INTRAVENOUS
  Filled 2015-10-14 (×4): qty 150

## 2015-10-14 MED ORDER — SODIUM CHLORIDE 0.9 % IJ SOLN
3.0000 mL | Freq: Two times a day (BID) | INTRAMUSCULAR | Status: DC
  Administered 2015-10-14 – 2015-10-16 (×3): 3 mL via INTRAVENOUS

## 2015-10-14 MED ORDER — DEXTROSE 50 % IV SOLN: 1.0000 | Freq: Once | INTRAVENOUS | Status: DC

## 2015-10-14 MED ORDER — SODIUM CHLORIDE 0.9 % IV BOLUS (SEPSIS)
500.0000 mL | INTRAVENOUS | Status: AC
  Administered 2015-10-14: 500 mL via INTRAVENOUS

## 2015-10-14 NOTE — ED Notes (Signed)
Pt arrives from home via [facility]. 911 called rt pt not answering phone. Pt was found unresponsive by EMS and was diaphoretic with urinary incontinence. Pt ate breakfast this morning and has had no recent changes meds and had his usual dose of insulin this morning. En route patient received 25G of dextrose and 554ml NS. Pt cbg went from 17 to 249 down to 98.

## 2015-10-14 NOTE — Progress Notes (Signed)
ANTIBIOTIC CONSULT NOTE - INITIAL  Pharmacy Consult for Aztreonam, vancomycin, and levaquin Indication: Sepsis  Allergies  Allergen Reactions  . Morphine And Related Itching  . Penicillins Hives    Patient Measurements: Height: 5' 4.5" (163.8 cm) Weight: 185 lb (83.915 kg) IBW/kg (Calculated) : 60.35  Vital Signs: BP: 103/57 mmHg (01/22 1045) Pulse Rate: 72 (01/22 1045) Intake/Output from previous day:   Intake/Output from this shift:    Labs: No results for input(s): WBC, HGB, PLT, LABCREA, CREATININE in the last 72 hours. CrCl cannot be calculated (Patient has no serum creatinine result on file.). No results for input(s): VANCOTROUGH, VANCOPEAK, VANCORANDOM, GENTTROUGH, GENTPEAK, GENTRANDOM, TOBRATROUGH, TOBRAPEAK, TOBRARND, AMIKACINPEAK, AMIKACINTROU, AMIKACIN in the last 72 hours.   Microbiology: No results found for this or any previous visit (from the past 720 hour(s)).  Medical History: Past Medical History  Diagnosis Date  . CAD (coronary artery disease)   . Hyperlipidemia   . Diabetes mellitus   . Myocardial infarction ([place])   . Arthritis     RA  . Memory difficulty 06/22/2015  . Tremor 06/22/2015    Intentional, right hand    Assessment: 82 yo M presents on 1/22 after being found unresponsive and diaphoretic. Pharmacy consulted to dose abx for sepsis. One time doses of abx ordered in the ED. WBC slightly elevated at 10.7. SCr ok at 1.2, CrCl ~45-32ml/min.  Goal of Therapy:  Vancomycin trough level 15-20 mcg/ml  Resolution of infection  Plan:  Start aztreonam 2g IV Q8 Start Levaquin 750mg  IV Q48 Start vancomycin 750mg  IV Q12 Monitor clinical picture, renal function, VT prn F/U C&S, abx deescalation / LOT  Consider need for both aztreonam and levaquin?  Elenor Quinones, PharmD, BCPS Clinical Pharmacist Pager [phone] 10/14/2015 11:20 AM

## 2015-10-14 NOTE — ED Notes (Signed)
First set of blood cultures drawn, will begin antibiotics at this time.

## 2015-10-14 NOTE — ED Provider Notes (Signed)
CSN: [id number]     Arrival date & time 10/14/15  1033 History   First MD Initiated Contact with Patient 10/14/15 1034     Chief Complaint  Patient presents with  . Hypoglycemia     (Consider location/radiation/quality/duration/timing/severity/associated sxs/prior  Treatment) HPI Joseph Mora is a 82 y.o. male with hx of CAD, hyperlipidimia, DM, MI, presents to ED with complaint of unresponsive episode.apparently patient woke up feeling well. He received his regular medications which are given to him by his wife. Wife had to leave and called him at home to check on him. After she was unable to get in touch with him which is unusual, EMS was called, patient found at home unresponsive, CBG was 17.  patient was given 25 g of dextrose and 500 mg bolus of fluids. He became more alert and oriented. PerEMS, sugar went up to 249, and down to 98 upon arrival to the ER. Patient has no complaints. He is sure that his wife gave him a correct dose of his insulin. He has not had any change in his medications recently. He denies any recent illnesses, no fever or chills, he does state that his urine has a strong smell but denies any urinary frequency, urgency, dysuria.   Past Medical History  Diagnosis Date  . CAD (coronary artery disease)   . Hyperlipidemia   . Diabetes mellitus   . Myocardial infarction ([place])   . Arthritis     RA  . Memory difficulty 06/22/2015  . Tremor 06/22/2015    Intentional, right hand   Past Surgical History  Procedure Laterality Date  . Carpal tunnel release Bilateral   . Parathyroidectomy    . Cataract extraction Bilateral   . Inguinal hernia repair    . Back surgery     Family History  Problem Relation Age of Onset  . Heart attack Paternal Aunt   . Stroke Mother   . Diabetes Mother   . Dementia Mother   . Diabetes Sister   . Stroke Sister   . Dementia Sister   . Diabetes Brother   . Dementia Brother   . Hypertension Neg Hx    Social History  Substance Use  Topics  . Smoking status: Former Smoker    Quit date: 08/03/2010  . Smokeless tobacco: Current User    Types: Chew  . Alcohol Use: No    Review of Systems  Constitutional: Negative for fever and chills.  Respiratory: Negative for cough, chest tightness and shortness of breath.   Cardiovascular: Negative for chest pain, palpitations and leg swelling.  Gastrointestinal: Negative for nausea, vomiting, abdominal pain, diarrhea and abdominal distention.  Genitourinary: Negative for dysuria, urgency, frequency and hematuria.  Musculoskeletal: Negative for myalgias, arthralgias, neck pain and neck stiffness.  Skin: Negative for rash.  Allergic/Immunologic: Negative for immunocompromised state.  Neurological: Negative for dizziness, weakness, light-headedness, numbness and headaches.  Psychiatric/Behavioral: Positive for confusion.  All other systems reviewed and are negative.     Allergies  Morphine and related and Penicillins  Home Medications   Prior to Admission medications   Medication Sig Start Date End Date Taking? Authorizing Provider  aspirin 81 MG tablet Take 81 mg by mouth daily. 10/01/11   Burnell Blanks, MD  atorvastatin (LIPITOR) 40 MG tablet TAKE 1 TABLET BY MOUTH DAILY 07/16/15   Elby Showers, MD  donepezil (ARICEPT) 5 MG tablet TAKE 1 TABLET(5 MG) BY MOUTH AT BEDTIME 09/11/15   Elby Showers, MD  doxazosin (CARDURA)  2 MG tablet Take 2 mg by mouth daily.  06/17/15   Historical Provider, MD  insulin NPH Human (HUMULIN N) 100 UNIT/ML injection 40units subq before breakfast and 15units before dinner Patient taking differently: 60 units subq before breakfast and 30 units before dinner 06/01/15   Florencia Reasons, MD  insulin regular (HUMULIN R) 100 units/mL injection 20 units before breakfast and 10 units before supper 06/28/15   Elby Showers, MD  Insulin Syringe-Needle U-100 (INSULIN SYRINGE 1CC/30GX5/16") 30G X 5/16" 1 ML MISC USE TWICE DAILY OR AS DIRECTED 03/16/15   Elby Showers, MD  lisinopril (PRINIVIL,ZESTRIL) 20 MG tablet TAKE 1 TABLET BY MOUTH EVERY DAY 09/11/15   Elby Showers, MD  lisinopril (PRINIVIL,ZESTRIL) 5 MG tablet Take 1 tablet (5 mg total) by mouth daily. 06/11/15   Elby Showers, MD  metoprolol (LOPRESSOR) 50 MG tablet Take 1 tablet (50 mg total) by mouth 2 (two) times daily. 06/11/15   Elby Showers, MD  nitroGLYCERIN (NITROSTAT) 0.4 MG SL tablet Place 1 tablet (0.4 mg total) under the tongue every 5 (five) minutes as needed for chest pain. 03/31/14   Burnell Blanks, MD  tamsulosin (FLOMAX) 0.4 MG CAPS capsule TAKE ONE CAPSULE BY MOUTH EVERY DAY 02/22/15   Elby Showers, MD  vitamin B-12 100 MCG tablet Take 1 tablet (100 mcg total) by mouth daily. 06/01/15   Florencia Reasons, MD   BP 103/57 mmHg  Pulse 72  Resp 18  Ht 5' 4.5" (1.638 m)  Wt 83.915 kg  BMI 31.28 kg/m2  SpO2 98% Physical Exam  Constitutional: He is oriented to person, place, and time. He appears well-developed and well-nourished. No distress.  HENT:  Head: Normocephalic and atraumatic.  Eyes: Conjunctivae and EOM are normal. Pupils are equal, round, and reactive to light.  Neck: Neck supple.  Cardiovascular: Normal rate, regular rhythm and normal heart sounds.   Pulmonary/Chest: Effort normal. No respiratory distress. He has no wheezes. He has no rales.  Abdominal: Soft. Bowel sounds are normal. He exhibits no distension. There is no tenderness. There is no rebound.  Musculoskeletal: He exhibits no edema.  Neurological: He is alert and oriented to person, place, and time.  5/5 and equal upper and lower extremity strength bilaterally. Equal grip strength bilaterally. Normal finger to nose and heel to shin. No pronator drift.   Skin: Skin is warm and dry.  Nursing note and vitals reviewed.   ED Course  Procedures (including critical care time) Labs Review Labs Reviewed  CBC WITH DIFFERENTIAL/PLATELET - Abnormal; Notable for the following:    WBC 10.7 (*)    Platelets 110 (*)     Neutro Abs 9.1 (*)    All other components within normal limits  COMPREHENSIVE METABOLIC PANEL - Abnormal; Notable for the following:    Glucose, Bld 111 (*)    Calcium 8.7 (*)    Total Protein 5.9 (*)    Albumin 3.1 (*)    GFR calc non Af Amer 55 (*)    All other components within normal limits  URINALYSIS, ROUTINE W REFLEX MICROSCOPIC (NOT AT [facility]) - Abnormal; Notable for the following:    Glucose, UA 100 (*)    All other components within normal limits  I-STAT CG4 LACTIC ACID, ED - Abnormal; Notable for the following:    Lactic Acid, Venous 2.40 (*)    All other components within normal limits  CBG MONITORING, ED - Abnormal; Notable for the following:  Glucose-Capillary 102 (*)    All other components within normal limits  CBG MONITORING, ED - Abnormal; Notable for the following:    Glucose-Capillary 124 (*)    All other components within normal limits  URINE CULTURE  CULTURE, BLOOD (ROUTINE X 2)  CULTURE, BLOOD (ROUTINE X 2)  C DIFFICILE QUICK SCREEN W PCR REFLEX  PROTIME-INR  HEMOGLOBIN A1C  I-STAT TROPOININ, ED  CBG MONITORING, ED  I-STAT CG4 LACTIC ACID, ED    Imaging Review Dg Chest Port 1 View  10/14/2015  CLINICAL DATA:  Unresponsive, hypoglycemic EXAM: PORTABLE CHEST - 1 VIEW COMPARISON:  05/25/2015 FINDINGS: The heart size and mediastinal contours are within normal limits. Both lungs are clear. The visualized skeletal structures are unremarkable. IMPRESSION: No active disease. Electronically Signed   By: Inez Catalina M.D.   On: 10/14/2015 12:59   I have personally reviewed and evaluated these images and lab results as part of my medical decision-making.   EKG Interpretation   Date/Time:  Sunday October 14 2015 10:42:02 EST Ventricular Rate:  73 PR Interval:  221 QRS Duration: 152 QT Interval:  439 QTC Calculation: 484 R Axis:   85 Text Interpretation:  Sinus rhythm Ventricular premature complex Prolonged  PR interval Right bundle branch block No  significant change since last  tracing Confirmed by LITTLE MD, RACHEL [id number]) on 10/14/2015 11:02:24 AM      MDM   Final diagnoses:  Hypoglycemia  Hypothermia, initial encounter    Pt found at home unresponsive by EMS. CBG 17. Given 25g of Dextrose, BP in 123XX123 systolic, given 123456 bolus. Pt has no complaints. States "i am feeling fine." Rectal temp 96. With borderline hypotension, suspicious for sepsis. Will start sepsis order protocol. BP here 103/57. CBG 96.  11:47 AM Pt ate crackers and had some juice. He continues to be alert and oriented. Will rechck blood sugar and continue to monitor. Labs pending  Labs unremarkable except for lactic acid which is elevated. Will admit for hypglycemia, hypothermia, possible sepsis. Spoke with [facility], will admit.   Filed Vitals:   10/14/15 1430 10/14/15 1432 10/14/15 1500 10/14/15 1515  BP: 130/88  135/93 140/85  Pulse: 96  93 96  Temp:  97.6 F (36.4 C)    TempSrc:  Oral    Resp: 23  17 21   Height:      Weight:      SpO2: 93%  97% 99%     Jeannett Senior, PA-C 10/14/15 [place], MD 10/14/15 [phone]

## 2015-10-14 NOTE — H&P (Signed)
[facility] History and Physical  RENATO PENNICK [id number] DOB: 1932/12/11 DOA: 10/14/2015  Referring physician: Lahoma Rocker PCP: Elby Showers, MD   Chief Complaint: acute encephalopathy/hypoglycemia  HPI: Joseph Mora is a very pleasant 82 y.o. male with a past medical history that includes CAD, hyperlipidemia, diabetes, presents to the emergency department with chief complaint acute encephalopathy. Initial evaluation reveals hypoglycemia, hypothermia and hypotension.  Information is obtained from the wife who is at the bedside. She reports her husband being in his usual state of health this morning when he awakened she gave him his regular medications and went to church. Mentally disabled son who lives with the patient reports seeing patient lying in bed was unresponsive to his stimuli. EMS was called patient was found unresponsive CBG was 17. He was given 25 g of dextrose and 500 mg bolus of normal saline. Per EMS his blood sugar went to 249. Upon arrival to the emergency department blood sugar was 98. Patient has no recollection of these events. They he denies chest pain palpitations, fever chills cough shortness of breath recent travel or sick contacts. He denies abdominal pain nausea vomiting but does report frequent loose stools over the last several days. Denies bright red blood per rectum or melena. He denies dysuria hematuria frequency or urgency.  In the emergency department temperature 96.4 is hemodynamically stable and not hypoxic. Was given IV fluids and broad spectrum antibiotics are initiated.  Review of Systems:  10 point review of systems complete and all systems are negative except as indicated in the history of present illness  Past Medical History  Diagnosis Date  . CAD (coronary artery disease)   . Hyperlipidemia   . Diabetes mellitus   . Myocardial infarction ([place])   . Arthritis     RA  . Memory difficulty 06/22/2015  . Tremor 06/22/2015    Intentional,  right hand   Past Surgical History  Procedure Laterality Date  . Carpal tunnel release Bilateral   . Parathyroidectomy    . Cataract extraction Bilateral   . Inguinal hernia repair    . Back surgery     Social History:  reports that he quit smoking about 5 years ago. His smokeless tobacco use includes Chew. He reports that he does not drink alcohol or use illicit drugs.  Allergies  Allergen Reactions  . Morphine And Related Itching  . Penicillins Hives    Family History  Problem Relation Age of Onset  . Heart attack Paternal Aunt   . Stroke Mother   . Diabetes Mother   . Dementia Mother   . Diabetes Sister   . Stroke Sister   . Dementia Sister   . Diabetes Brother   . Dementia Brother   . Hypertension Neg Hx      Prior to Admission medications   Medication Sig Start Date End Date Taking? Authorizing Provider  aspirin 81 MG tablet Take 81 mg by mouth daily. 10/01/11  Yes Burnell Blanks, MD  atorvastatin (LIPITOR) 40 MG tablet TAKE 1 TABLET BY MOUTH DAILY 07/16/15  Yes Elby Showers, MD  donepezil (ARICEPT) 5 MG tablet TAKE 1 TABLET(5 MG) BY MOUTH AT BEDTIME 09/11/15  Yes Elby Showers, MD  doxazosin (CARDURA) 2 MG tablet Take 2 mg by mouth daily.  06/17/15  Yes Historical Provider, MD  insulin NPH Human (HUMULIN N) 100 UNIT/ML injection 40units subq before breakfast and 15units before dinner Patient taking differently: 60 units subq before breakfast and 30 units before dinner  06/01/15  Yes Florencia Reasons, MD  insulin regular (NOVOLIN R,HUMULIN R) 100 units/mL injection Inject 15-25 Units into the skin 2 (two) times daily before a meal. 25 units in the morning and 15 units at bedtime   Yes Historical Provider, MD  Insulin Syringe-Needle U-100 (INSULIN SYRINGE 1CC/30GX5/16") 30G X 5/16" 1 ML MISC USE TWICE DAILY OR AS DIRECTED 03/16/15  Yes Elby Showers, MD  lisinopril (PRINIVIL,ZESTRIL) 20 MG tablet TAKE 1 TABLET BY MOUTH EVERY DAY 09/11/15  Yes Elby Showers, MD  metoprolol  (LOPRESSOR) 50 MG tablet Take 1 tablet (50 mg total) by mouth 2 (two) times daily. 06/11/15  Yes Elby Showers, MD  tamsulosin (FLOMAX) 0.4 MG CAPS capsule TAKE ONE CAPSULE BY MOUTH EVERY DAY 02/22/15  Yes Elby Showers, MD  vitamin B-12 100 MCG tablet Take 1 tablet (100 mcg total) by mouth daily. 06/01/15  Yes Florencia Reasons, MD  nitroGLYCERIN (NITROSTAT) 0.4 MG SL tablet Place 1 tablet (0.4 mg total) under the tongue every 5 (five) minutes as needed for chest pain. 03/31/14   Burnell Blanks, MD   Physical Exam: Filed Vitals:   10/14/15 1500 10/14/15 1515 10/14/15 1545 10/14/15 1751  BP: 135/93 140/85 101/87 100/75  Pulse: 93 96 91   Temp:    97.8 F (36.6 C)  TempSrc:    Oral  Resp: 17 21 18 18   Height:      Weight:      SpO2: 97% 99% 100% 100%    Wt Readings from Last 3 Encounters:  10/14/15 83.915 kg (185 lb)  06/22/15 90.266 kg (199 lb)  06/22/15 89.585 kg (197 lb 8 oz)    General:  Appears calm and comfortable, cooperative obese Eyes: PERRL, normal lids, irises & conjunctiva ENT: grossly normal hearing, mucous membranes of his mouth are pink slightly dry Neck: no LAD, masses or thyromegaly Cardiovascular: RRR, no m/r/g. No LE edema. Pedal pulses present and palpable Telemetry: SR, no arrhythmias  Respiratory: CTA bilaterally, no w/r/r. Normal respiratory effort. Abdomen: soft, ntnd positive bowel sounds no guarding or rebounding Skin: no rash or induration seen on limited exam Musculoskeletal: grossly normal tone BUE/BLE, joints without swelling/erythema Psychiatric: grossly normal mood and affect, speech fluent and appropriate Neurologic: grossly non-focal.          Labs on Admission:  Basic Metabolic Panel:  Recent Labs Lab 10/14/15 1213  NA 142  K 3.8  CL 106  CO2 26  GLUCOSE 111*  BUN 11  CREATININE 1.20  CALCIUM 8.7*   Liver Function Tests:  Recent Labs Lab 10/14/15 1213  AST 30  ALT 18  ALKPHOS 69  BILITOT 1.0  PROT 5.9*  ALBUMIN 3.1*   No  results for input(s): LIPASE, AMYLASE in the last 168 hours. No results for input(s): AMMONIA in the last 168 hours. CBC:  Recent Labs Lab 10/14/15 1213  WBC 10.7*  NEUTROABS 9.1*  HGB 16.4  HCT 48.5  MCV 95.5  PLT 110*   Cardiac Enzymes: No results for input(s): CKTOTAL, CKMB, CKMBINDEX, TROPONINI in the last 168 hours.  BNP (last 3 results) No results for input(s): BNP in the last 8760 hours.  ProBNP (last 3 results) No results for input(s): PROBNP in the last 8760 hours.  CBG:  Recent Labs Lab 10/14/15 1053 10/14/15 1213 10/14/15 1254 10/14/15 1647  GLUCAP 96 102* 124* 196*    Radiological Exams on Admission: Dg Chest Port 1 View  10/14/2015  CLINICAL DATA:  Unresponsive, hypoglycemic EXAM:  PORTABLE CHEST - 1 VIEW COMPARISON:  05/25/2015 FINDINGS: The heart size and mediastinal contours are within normal limits. Both lungs are clear. The visualized skeletal structures are unremarkable. IMPRESSION: No active disease. Electronically Signed   By: Inez Catalina M.D.   On: 10/14/2015 12:59    EKG: Independently reviewed.Sinus rhythm Ventricular premature complex Prolonged PR interval Right bundle branch block  Assessment/Plan Principal Problem:   Acute encephalopathy Active Problems:   CAD (coronary artery disease)   Insulin dependent diabetes mellitus ([place])   Essential hypertension   Tremor   Hypoglycemia   CAD in native artery  #1. Acute encephalopathy. Resolved at admission. Secondary to hypoglycemia. Urinalysis and chest x-ray without signs of infection. No signs of infection at this point except mildly elevated lactic acid.  -Admit -Obtain B-12 folate RPR -Track lactic acid -Glucose control -Continue antibiotics for 24 hours then discontinue once urine culture  #2. Hypoglycemia. Etiology unclear. No recent change in his medications. Occasions administered by wife. No change in his appetite or eating habits. A been a medication error. Resolved on  admission. -We will hold his home insulin regimen -Sliding scale insulin -Obtain a hemoglobin A1c -Request diabetic consult to discuss options with wife as she indicated insurance won't cover current insulin -Carb modified diet  #3. Hypertension. Blood pressure somewhat soft on admission. Home medications include cardura, lisinopril, Lopressor. -Continue Lopressor -Hold her antihypertensives for now -Monitor blood pressure and resume home regimen as indicated -Normal saline 125 ml/hr   Code Status: full DVT Prophylaxis: Family Communication: wife and son at bedside Disposition Plan: home hopefully 24 hours  Time spent: [address] [place], [facility]  Care during the described time interval was provided by me . I have reviewed this patient's available data, including medical history, events of note, physical examination, and all test results as part of my evaluation. I have personally reviewed and interpreted all radiology studies. I have discussed the A&P with NP Dyanne Carrel and agree with above plan.

## 2015-10-14 NOTE — Progress Notes (Signed)
Pharmacy Code Sepsis Protocol  Time of code sepsis page: 1126 [x]  Antibiotics delivered at 1137  Were antibiotics ordered at the time of the code sepsis page? No   Abx ordered before code sepsis page  Pharmacy consulted for: Aztreonam, vancomycin, Levaquin  Anti-infectives    Start     Dose/Rate Route Frequency Ordered Stop   10/14/15 1130  vancomycin (VANCOCIN) 2,000 mg in sodium chloride 0.9 % 500 mL IVPB     2,000 mg 250 mL/hr over 120 Minutes Intravenous  Once 10/14/15 1118     10/14/15 1115  levofloxacin (LEVAQUIN) IVPB 750 mg     750 mg 100 mL/hr over 90 Minutes Intravenous  Once 10/14/15 1105     10/14/15 1115  aztreonam (AZACTAM) 2 g in dextrose 5 % 50 mL IVPB     2 g 100 mL/hr over 30 Minutes Intravenous  Once 10/14/15 1105     10/14/15 1115  vancomycin (VANCOCIN) IVPB 1000 mg/200 mL premix  Status:  Discontinued     1,000 mg 200 mL/hr over 60 Minutes Intravenous  Once 10/14/15 1105 10/14/15 1118        Nurse education provided: [x]  Minutes left to administer antibiotics to achieve 1 hour goal [x]  Correct order of antibiotic administration [x]  Antibiotic Y-site compatibilities    Elenor Quinones, PharmD, BCPS Clinical Pharmacist Pager [phone] 10/14/2015 12:15 PM

## 2015-10-14 NOTE — Progress Notes (Signed)
New Admission Note:   Arrival Method: [place] Mental Orientation: AxO x4 Telemetry: box 5  Assessment: Completed Skin: see doc flowsheets IV: R hand  Pain: no complaints  Tubes: none  Safety Measures: Safety Fall Prevention Plan has been given, discussed and signed Admission: Completed [address] Orientation: Patient has been orientated to the room, unit and staff.  Family: At bedside.   Orders have been reviewed and implemented. Will continue to monitor the patient. Call light has been placed within reach and bed alarm has been activated.   Alcide Evener BSN, RN Phone number: [phone]

## 2015-10-14 NOTE — ED Notes (Signed)
Attempted report to 6E 

## 2015-10-14 NOTE — ED Notes (Signed)
Attempted report once more to 6E with no success.

## 2015-10-15 ENCOUNTER — Other Ambulatory Visit: Payer: Self-pay

## 2015-10-15 DIAGNOSIS — R680 Hypothermia, not associated with low environmental temperature: Secondary | ICD-10-CM | POA: Diagnosis not present

## 2015-10-15 DIAGNOSIS — R4182 Altered mental status, unspecified: Secondary | ICD-10-CM | POA: Diagnosis not present

## 2015-10-15 DIAGNOSIS — I959 Hypotension, unspecified: Secondary | ICD-10-CM | POA: Diagnosis not present

## 2015-10-15 DIAGNOSIS — I2510 Atherosclerotic heart disease of native coronary artery without angina pectoris: Secondary | ICD-10-CM | POA: Diagnosis not present

## 2015-10-15 DIAGNOSIS — I10 Essential (primary) hypertension: Secondary | ICD-10-CM | POA: Diagnosis not present

## 2015-10-15 DIAGNOSIS — I252 Old myocardial infarction: Secondary | ICD-10-CM | POA: Diagnosis not present

## 2015-10-15 DIAGNOSIS — M069 Rheumatoid arthritis, unspecified: Secondary | ICD-10-CM | POA: Diagnosis not present

## 2015-10-15 DIAGNOSIS — Z794 Long term (current) use of insulin: Secondary | ICD-10-CM | POA: Diagnosis not present

## 2015-10-15 DIAGNOSIS — G9340 Encephalopathy, unspecified: Secondary | ICD-10-CM | POA: Diagnosis not present

## 2015-10-15 DIAGNOSIS — Z880 Allergy status to penicillin: Secondary | ICD-10-CM | POA: Diagnosis not present

## 2015-10-15 DIAGNOSIS — R7309 Other abnormal glucose: Secondary | ICD-10-CM | POA: Diagnosis not present

## 2015-10-15 DIAGNOSIS — Z885 Allergy status to narcotic agent status: Secondary | ICD-10-CM | POA: Diagnosis not present

## 2015-10-15 DIAGNOSIS — Z87891 Personal history of nicotine dependence: Secondary | ICD-10-CM | POA: Diagnosis not present

## 2015-10-15 DIAGNOSIS — E162 Hypoglycemia, unspecified: Secondary | ICD-10-CM | POA: Diagnosis not present

## 2015-10-15 DIAGNOSIS — T68XXXA Hypothermia, initial encounter: Secondary | ICD-10-CM | POA: Diagnosis not present

## 2015-10-15 DIAGNOSIS — E785 Hyperlipidemia, unspecified: Secondary | ICD-10-CM | POA: Diagnosis not present

## 2015-10-15 DIAGNOSIS — R55 Syncope and collapse: Secondary | ICD-10-CM | POA: Diagnosis not present

## 2015-10-15 DIAGNOSIS — E11649 Type 2 diabetes mellitus with hypoglycemia without coma: Secondary | ICD-10-CM | POA: Diagnosis not present

## 2015-10-15 DIAGNOSIS — I440 Atrioventricular block, first degree: Secondary | ICD-10-CM | POA: Diagnosis not present

## 2015-10-15 DIAGNOSIS — E161 Other hypoglycemia: Secondary | ICD-10-CM | POA: Diagnosis not present

## 2015-10-15 DIAGNOSIS — Z79899 Other long term (current) drug therapy: Secondary | ICD-10-CM | POA: Diagnosis not present

## 2015-10-15 DIAGNOSIS — Z7982 Long term (current) use of aspirin: Secondary | ICD-10-CM | POA: Diagnosis not present

## 2015-10-15 DIAGNOSIS — I4510 Unspecified right bundle-branch block: Secondary | ICD-10-CM | POA: Diagnosis not present

## 2015-10-15 LAB — GLUCOSE, CAPILLARY
Glucose-Capillary: 210 mg/dL — ABNORMAL HIGH (ref 65–99)
Glucose-Capillary: 212 mg/dL — ABNORMAL HIGH (ref 65–99)
Glucose-Capillary: 219 mg/dL — ABNORMAL HIGH (ref 65–99)
Glucose-Capillary: 136 mg/dL — ABNORMAL HIGH (ref 65–99)

## 2015-10-15 LAB — CBC
HCT: 48.1 % (ref 39.0–52.0)
HEMOGLOBIN: 16.3 g/dL (ref 13.0–17.0)
MCH: 32.7 pg (ref 26.0–34.0)
MCHC: 33.9 g/dL (ref 30.0–36.0)
MCV: 96.4 fL (ref 78.0–100.0)
PLATELETS: 101 10*3/uL — AB (ref 150–400)
RBC: 4.99 MIL/uL (ref 4.22–5.81)
RDW: 13.4 % (ref 11.5–15.5)
WBC: 7.2 10*3/uL (ref 4.0–10.5)

## 2015-10-15 LAB — BASIC METABOLIC PANEL
ANION GAP: 8 (ref 5–15)
BUN: 8 mg/dL (ref 6–20)
CHLORIDE: 109 mmol/L (ref 101–111)
CO2: 25 mmol/L (ref 22–32)
CREATININE: 1.11 mg/dL (ref 0.61–1.24)
Calcium: 8.2 mg/dL — ABNORMAL LOW (ref 8.9–10.3)
GFR calc non Af Amer: >60 mL/min (ref >60)
Glucose, Bld: 146 mg/dL — ABNORMAL HIGH (ref 65–99)
POTASSIUM: 3.9 mmol/L (ref 3.5–5.1)
SODIUM: 142 mmol/L (ref 135–145)

## 2015-10-15 LAB — URINE CULTURE

## 2015-10-15 LAB — HEMOGLOBIN A1C
Hgb A1c MFr Bld: 7 % — ABNORMAL HIGH (ref 4.8–5.6)
Mean Plasma Glucose: 154 mg/dL

## 2015-10-15 MED ORDER — INSULIN NPH (HUMAN) (ISOPHANE) 100 UNIT/ML ~~LOC~~ SUSP
22.0000 [IU] | Freq: Two times a day (BID) | SUBCUTANEOUS | Status: DC
  Administered 2015-10-15 – 2015-10-16 (×2): 22 [IU] via SUBCUTANEOUS
  Filled 2015-10-15 (×2): qty 10

## 2015-10-15 MED ORDER — INSULIN NPH (HUMAN) (ISOPHANE) 100 UNIT/ML ~~LOC~~ SUSP: SUBCUTANEOUS | Status: DC

## 2015-10-15 MED ORDER — INSULIN ASPART 100 UNIT/ML ~~LOC~~ SOLN: 0.0000 [IU] | Freq: Every day | SUBCUTANEOUS | Status: DC

## 2015-10-15 MED ORDER — INSULIN REGULAR HUMAN 100 UNIT/ML IJ SOLN: INTRAMUSCULAR | Status: DC

## 2015-10-15 MED ORDER — INSULIN ASPART 100 UNIT/ML ~~LOC~~ SOLN
0.0000 [IU] | Freq: Three times a day (TID) | SUBCUTANEOUS | Status: DC
  Administered 2015-10-15: 5 [IU] via SUBCUTANEOUS

## 2015-10-15 NOTE — Progress Notes (Signed)
CRITICAL VALUE ALERT  Critical value received:  Anaerobic + GPC  Date of notification:  1/23  Time of notification:  0730  Critical value read back:Yes.    Nurse who received alert:  Tyna Jaksch  MD notified (1st page):  Coralyn Pear  Time of first page:  0730  Responding MD:  Coralyn Pear  Time MD responded:  [phone]

## 2015-10-15 NOTE — Progress Notes (Signed)
[facility] PROGRESS NOTE  Joseph Mora [id number] DOB: [birth year] DOA: 10/14/2015 PCP: Elby Showers, MD  Assessment/Plan: 1. Severe hypoglycemia. -Joseph Mora having a history of insulin-dependent diabetes mellitus, had reported taking 16 units at breakfast and 30 units at dinner. He was found to be unresponsive by family members with a blood sugar of 17 on the field. -Blood sugars now improved with the administration of dextrose and holding insulin initially. -He was seen and evaluated by diabetic coordinator. Will follow the recommendations and restart NPH at 22 units subcutaneous prior to breakfast and at mealtime. Plan to place patient on sliding scale coverage -Monitor his blood sugars over the next 24 hours.  2.  Acute encephalopathy/unresponsiveness. -This likely secondary to severe hypoglycemia, mental status changes improved with the administration of glucose and resolution of hypoglycemia.  3.  Positive blood cultures. -Blood cultures were obtain on admission with microbiology reporting one out of 2 positive blood cultures for gram-positive cocci. He is nontoxic appearing, afebrile, I suspect this may be contaminant. -Will discontinue aztreonam and Levaquin and continue vancomycin for the next 24 hours, follow-up on cultures in a.m. Plan to stop antibiotics if he grows coagulase negative Staph.   4.  Hypertension. -Blood pressure stable, will continue metoprolol 12.5 mg by mouth twice a day  Code Status: Full Code Family Communication:  Disposition Plan: Anticipate discharge in the next 24 hours if he remains stable   Consultants:  Diabetic coronary   Antibiotics:  Vancomycin  Aztreonam and Levaquin were discontinued  HPI/Subjective: Joseph Mora is a pleasant 82 year old woman with a past medical history of diabetes mellitus presented to the emergency department on 10/14/2015 with complaints of mental status changes/unresponsiveness. He was found by family  members unresponsive, EMS activated, found to have blood sugar of 17 on the field. Symptoms improved with the administration of glucose. His home diabetic regimen had been listed as insulin NPH 40 units subcutaneous before breakfast and 15 units subcutaneous prior to dinner, however, he reported taking 60 units prior to breakfast and 30 units prior to dinner. He denies skipping meals.  Objective: Filed Vitals:   10/15/15 0513 10/15/15 0750  BP: 113/58 126/69  Pulse: 93 74  Temp: 98.7 F (37.1 C) 98.7 F (37.1 C)  Resp: 18 18    Intake/Output Summary (Last 24 hours) at 10/15/15 1457 Last data filed at 10/15/15 0900  Gross per 24 hour  Intake    590 ml  Output    300 ml  Net    290 ml   Filed Weights   10/14/15 1054 10/15/15 0500  Weight: 83.915 kg (185 lb) 83.915 kg (185 lb)    Exam:   General:  Nontoxic appearing, no acute distress awake and alert  Cardiovascular: Regular rate and rhythm normal S1-S2 no murmurs or gallops  Respiratory: Normal respiratory effort, lungs are clear to auscultation bilaterally  Abdomen: Soft nontender nondistended  Musculoskeletal: No edema  Data Reviewed: Basic Metabolic Panel:  Recent Labs Lab 10/14/15 1213 10/15/15 0731  NA 142 142  K 3.8 3.9  CL 106 109  CO2 26 25  GLUCOSE 111* 146*  BUN 11 8  CREATININE 1.20 1.11  CALCIUM 8.7* 8.2*   Liver Function Tests:  Recent Labs Lab 10/14/15 1213  AST 30  ALT 18  ALKPHOS 69  BILITOT 1.0  PROT 5.9*  ALBUMIN 3.1*   No results for input(s): LIPASE, AMYLASE in the last 168 hours. No results for input(s): AMMONIA in the last 168 hours. CBC:  Recent Labs Lab 10/14/15 1213 10/15/15 0731  WBC 10.7* 7.2  NEUTROABS 9.1*  --   HGB 16.4 16.3  HCT 48.5 48.1  MCV 95.5 96.4  PLT 110* 101*   Cardiac Enzymes:  Recent Labs Lab 10/14/15 2011  TROPONINI 0.03   BNP (last 3 results) No results for input(s): BNP in the last 8760 hours.  ProBNP (last 3 results) No results for  input(s): PROBNP in the last 8760 hours.  CBG:  Recent Labs Lab 10/14/15 1213 10/14/15 1254 10/14/15 1647 10/14/15 2116 10/15/15 1140  GLUCAP 102* 124* 196* 159* 210*    Recent Results (from the past 240 hour(s))  Blood Culture (routine x 2)     Status: None (Preliminary result)   Collection Time: 10/14/15 11:45 AM  Result Value Ref Range Status   Specimen Description BLOOD LEFT ANTECUBITAL  Final   Special Requests BOTTLES DRAWN AEROBIC AND ANAEROBIC 5CCS  Final   Culture  Setup Time   Final    GRAM POSITIVE COCCI IN CLUSTERS ANAEROBIC BOTTLE ONLY CRITICAL RESULT CALLED TO, READ BACK BY AND VERIFIED WITH: J. ADAMS,RN AT [id number] ON [id number] BY Rhea Bleacher    Culture NO GROWTH 1 DAY  Final   Report Status PENDING  Incomplete  Blood Culture (routine x 2)     Status: None (Preliminary result)   Collection Time: 10/14/15 11:55 AM  Result Value Ref Range Status   Specimen Description BLOOD LEFT WRIST  Final   Special Requests BOTTLES DRAWN AEROBIC AND ANAEROBIC 5CCS  Final   Culture NO GROWTH 1 DAY  Final   Report Status PENDING  Incomplete  Urine culture     Status: None (Preliminary result)   Collection Time: 10/14/15  1:00 PM  Result Value Ref Range Status   Specimen Description URINE, CLEAN CATCH  Final   Special Requests NONE  Final   Culture CULTURE REINCUBATED FOR BETTER GROWTH  Final   Report Status PENDING  Incomplete     Studies: Dg Chest Port 1 View  10/14/2015  CLINICAL DATA:  Unresponsive, hypoglycemic EXAM: PORTABLE CHEST - 1 VIEW COMPARISON:  05/25/2015 FINDINGS: The heart size and mediastinal contours are within normal limits. Both lungs are clear. The visualized skeletal structures are unremarkable. IMPRESSION: No active disease. Electronically Signed   By: Inez Catalina M.D.   On: 10/14/2015 12:59    Scheduled Meds: . aspirin  81 mg Oral Daily  . atorvastatin  40 mg Oral Daily  . aztreonam  2 g Intravenous 3 times per day  . enoxaparin (LOVENOX) injection   40 mg Subcutaneous Q24H  . [START ON 10/16/2015] levofloxacin (LEVAQUIN) IV  750 mg Intravenous Q48H  . metoprolol tartrate  12.5 mg Oral BID  . sodium chloride  3 mL Intravenous Q12H  . vancomycin  750 mg Intravenous Q12H   Continuous Infusions: . sodium chloride 1,000 mL (10/14/15 1718)    Principal Problem:   Acute encephalopathy Active Problems:   CAD (coronary artery disease)   Insulin dependent diabetes mellitus ([place])   Essential hypertension   Tremor   Hypoglycemia   CAD in native artery    Time spent: 30 min    Kelvin Cellar  [facility] Pager [phone]. If 7PM-7AM, please contact night-coverage at [email or web address], password [email or web address] 10/15/2015, 2:57 PM

## 2015-10-15 NOTE — Progress Notes (Addendum)
Inpatient Diabetes Program Recommendations  AACE/ADA: New Consensus Statement on Inpatient Glycemic Control (2015)  Target Ranges:  Prepandial:   less than 140 mg/dL      Peak postprandial:   less than 180 mg/dL (1-2 hours)      Critically ill patients:  140 - 180 mg/dL   Review of Glycemic Control  Consult received regarding patient's insurance coverage of Humulin N and Humulin R. Have requested care management to review insurance coverage to assess what patient may be able to afford.. NPH and Regular are typically less expensive than the newer humalog/novolog and lantus/levemir.  [facility] carries ReliOn N and R which may be less expensive if patient is self - pay. Will review once care management has confirmed what insurance coverage patient has. Patient admitted with hypoglycemia.  The doses the patient takes at home appear higher than patient should require. Patient's weight at 84 kg, which should require no more than a total daily dose of 67 units (maximal based on 0.8 units/kg):  Basal (NPH) total would calculate to approximately 45 units and and Regular to 22 units for daily regimen of the following:  22 units NPH in the am and 22 units at HS and Regular 11 units before breakfast and 11 units before dinner.   Thank you Rosita Kea, RN, MSN, CDE  Diabetes Inpatient Program Office: [phone] Pager: [phone] 8:00 am to 5:00 pm

## 2015-10-16 ENCOUNTER — Telehealth: Payer: Self-pay

## 2015-10-16 LAB — GLUCOSE, CAPILLARY
GLUCOSE-CAPILLARY: 64 mg/dL — AB (ref 65–99)
GLUCOSE-CAPILLARY: 239 mg/dL — AB (ref 65–99)
Glucose-Capillary: 176 mg/dL — ABNORMAL HIGH (ref 65–99)

## 2015-10-16 MED ORDER — INSULIN NPH (HUMAN) (ISOPHANE) 100 UNIT/ML ~~LOC~~ SUSP
22.0000 [IU] | Freq: Every day | SUBCUTANEOUS | Status: DC
  Filled 2015-10-16: qty 10

## 2015-10-16 MED ORDER — INSULIN NPH (HUMAN) (ISOPHANE) 100 UNIT/ML ~~LOC~~ SUSP: 22.0000 [IU] | Freq: Every day | SUBCUTANEOUS | Status: DC

## 2015-10-16 MED ORDER — INSULIN NPH (HUMAN) (ISOPHANE) 100 UNIT/ML ~~LOC~~ SUSP: 12.0000 [IU] | Freq: Every day | SUBCUTANEOUS | Status: DC

## 2015-10-16 MED ORDER — METOPROLOL TARTRATE 25 MG PO TABS: 12.5000 mg | ORAL_TABLET | Freq: Two times a day (BID) | ORAL | Status: DC

## 2015-10-16 NOTE — Telephone Encounter (Signed)
At last documentation I could find was Humalin R at 25IU @ breakfast and 15 IU @ supper.  Humalin N @ 22IU @ breakfast and 22IU @ supper.   Ms. Zachry states that she was told his sugars were to low in the mornings because he is taking to much. She could not tell me what dosage he was given this morning only that he was given 22IU last night.

## 2015-10-16 NOTE — Telephone Encounter (Signed)
We are referring him to Endocrinologist

## 2015-10-16 NOTE — Progress Notes (Addendum)
Inpatient Diabetes Program Recommendations  AACE/ADA: New Consensus Statement on Inpatient Glycemic Control (2015)  Target Ranges:  Prepandial:   less than 140 mg/dL      Peak postprandial:   less than 180 mg/dL (1-2 hours)      Critically ill patients:  140 - 180 mg/dL   Review of Glycemic Control  Inpatient Diabetes Program Recommendations:    Patient received 22 units NPH last HS-fasting glucose this am at 64 mg/dL.  Appears that at this time, patient may need another decrease in HS NPH to 10-11 units. Noted patient received 22 units NPH this am- (slightly high following hypoglycemia this am-may have over-treated). Will follow throughout the day and check on home insulin affordability.  Patient can pay much less for ReliOn NPH and Regular at home from [facility] discuss with wife of patient. 1/24 Ad-spoke at length with patient and wife. Patient states his wife takes care of his dm supplies, meds and everything medically.  She has been getting her insulin at [facility] which has been costing close to $200.00 per mongh.  She has been purchasing Novolin N and R. I gave her information on the ReliOn insulins N and R, best price at [employer].. Also wife states patient stays hungry most all the day. This could well be due to the high doses of N and R he has been taking.  I explained this to the wife and patient and explained that with only 22 units NPH given last HS, he was even slightly low this am. The Regular insulin he has been taking at 60 units in the am and 15 units ac supper. (Please see prior note from 10/15/15) Again for discharge I would recommend patient take no more than 22 units NPH at HS and possibly am as well. (will follow today for correction needs if any). The Regular at 11 units in the am and 11 units ac supper. Patient could use a correction scale at both breakfast and dinner if needed-would use the moderate correction used here, but take only with breakfast and dinner. Patient will  follow up with Dr Renold Genta who can adjust as needed.  Thank you Rosita Kea, RN, MSN, CDE  Diabetes Inpatient Program Office: [phone] Pager: [phone] 8:00 am to 5:00 pm

## 2015-10-16 NOTE — Telephone Encounter (Signed)
Patients wife contacted office and states that he is still in hospital and that they are not letting him leave yet. She was told by a diabetic education person at the hospital that he is on way to much insulin- that he should not be taking more than 22iu at bedtime. She states that they recommended "relion" through [facility] which is a lot cheaper than [facility]. She is unsure of changes to be made and wants your advice. We just sent a new prescription to pharmacy for novolog to the pharmacy. Mrs. Tambasco is going to call the pharmacy and check price of new prescription.

## 2015-10-16 NOTE — Discharge Summary (Signed)
Physician Discharge Summary  Joseph Mora [id number] DOB: 06/13/1933 DOA: 10/14/2015  PCP: Elby Showers, MD  Admit date: 10/14/2015 Discharge date: 10/16/2015  Time spent: 35 minutes  Recommendations for Outpatient Follow-up:  1. Please follow-up on blood sugars, he presented with severe hypoglycemia having blood sugars 17 on the field. He had reported taking insulin NPH 60 units in a.m., 30 units in p.m. This was decreased to 22 units in a.m. and 12 units in p.m.  2. Follow-up on blood pressures, his metoprolol dose was decreased to 12.5 g by mouth twice a day given initially presenting with low blood pressures. 3. Prior to discharge she was set up with [facility] for PT and RN   Discharge Diagnoses:  Principal Problem:   Acute encephalopathy Active Problems:   CAD (coronary artery disease)   Insulin dependent diabetes mellitus ([place])   Essential hypertension   Tremor   Hypoglycemia   CAD in native artery   Discharge Condition: Stable  Diet recommendation: Carb Modified Diet  Filed Weights   10/14/15 1054 10/15/15 0500  Weight: 83.915 kg (185 lb) 83.915 kg (185 lb)    History of present illness:  Joseph Mora is a very pleasant 82 y.o. male with a past medical history that includes CAD, hyperlipidemia, diabetes, presents to the emergency department with chief complaint acute encephalopathy. Initial evaluation reveals hypoglycemia, hypothermia and hypotension.  Information is obtained from the wife who is at the bedside. She reports her husband being in his usual state of health this morning when he awakened she gave him his regular medications and went to church. Mentally disabled son who lives with the patient reports seeing patient lying in bed was unresponsive to his stimuli. EMS was called patient was found unresponsive CBG was 17. He was given 25 g of dextrose and 500 mg bolus of normal saline. Per EMS his blood sugar went to 249. Upon arrival to the  emergency department blood sugar was 98. Patient has no recollection of these events. They he denies chest pain palpitations, fever chills cough shortness of breath recent travel or sick contacts. He denies abdominal pain nausea vomiting but does report frequent loose stools over the last several days. Denies bright red blood per rectum or melena. He denies dysuria hematuria frequency or urgency.  Hospital Course:  Joseph Mora is a pleasant 82 year old woman with a past medical history of diabetes mellitus presented to the emergency department on 10/14/2015 with complaints of mental status changes/unresponsiveness. He was found by family members unresponsive, EMS activated, found to have blood sugar of 17 on the field. Symptoms improved with the administration of glucose. His home diabetic regimen had been listed as insulin NPH 40 units subcutaneous before breakfast and 15 units subcutaneous prior to dinner, however, he reported taking 60 units prior to breakfast and 30 units prior to dinner. He denies skipping meals.   Severe hypoglycemia. -Joseph Mora having a history of insulin-dependent diabetes mellitus, had reported taking 60 units at breakfast and 30 units at dinner. He was found to be unresponsive by family members with a blood sugar of 17 on the field. -Blood sugars now improved with the administration of dextrose and holding insulin initially. -He was seen and evaluated by diabetic coordinator. Will follow the recommendations and restart NPH at 22 units subcutaneous prior to breakfast and at mealtime. Plan to place patient on sliding scale coverage -On 10/16/2015 am blood sugars again falling to 64. His evening dose of NPH was decreased to  12 units.  -Please follow up on his blood sugars as his insulin regimen may need to be further titrated.   2. Acute encephalopathy/unresponsiveness. -This likely secondary to severe hypoglycemia, mental status changes improved with the administration of glucose  and resolution of hypoglycemia.  3. Positive blood cultures. -Blood cultures were obtain on admission with microbiology reporting one out of 2 positive blood cultures for gram-positive cocci. He is nontoxic appearing, afebrile, I suspect this may be contaminant. -On 10/16/2015 I spoke with microbiology, culture growing coag neg Staph, likely representing a contaminant. Vanc was stopped.   4. Hypertension. -Please follow up on blood pressures, initially presenting with hypotension. His Metoprolol was decreased to 12.5 mg PO BID  Consultations:  Diabetic Coordinator  Discharge Exam: Filed Vitals:   10/16/15 0454 10/16/15 0804  BP: 138/81 136/77  Pulse: 79 84  Temp: 98.8 F (37.1 C) 98.6 F (37 C)  Resp: 16 17     General: Nontoxic appearing, no acute distress awake and alert, ambulated down the hallway  Cardiovascular: Regular rate and rhythm normal S1-S2 no murmurs or gallops  Respiratory: Normal respiratory effort, lungs are clear to auscultation bilaterally  Abdomen: Soft nontender nondistended  Musculoskeletal: No edema  Discharge Instructions   Discharge Instructions    Call MD for:  difficulty breathing, headache or visual disturbances    Complete by:  As directed      Call MD for:  extreme fatigue    Complete by:  As directed      Call MD for:  hives    Complete by:  As directed      Call MD for:  persistant dizziness or light-headedness    Complete by:  As directed      Call MD for:  persistant nausea and vomiting    Complete by:  As directed      Call MD for:  redness, tenderness, or signs of infection (pain, swelling, redness, odor or green/yellow discharge around incision site)    Complete by:  As directed      Call MD for:  severe uncontrolled pain    Complete by:  As directed      Call MD for:  temperature >100.4    Complete by:  As directed      Call MD for:    Complete by:  As directed      Diet - low sodium heart healthy    Complete by:  As  directed      Increase activity slowly    Complete by:  As directed           Current Discharge Medication List    CONTINUE these medications which have CHANGED   Details  !! insulin NPH Human (HUMULIN N,NOVOLIN N) 100 UNIT/ML injection Inject 0.12 mLs (12 Units total) into the skin at bedtime. Qty: 10 mL, Refills: 11    !! insulin NPH Human (HUMULIN N,NOVOLIN N) 100 UNIT/ML injection Inject 0.22 mLs (22 Units total) into the skin daily before breakfast. Qty: 10 mL, Refills: 11    metoprolol tartrate (LOPRESSOR) 25 MG tablet Take 0.5 tablets (12.5 mg total) by mouth 2 (two) times daily. Qty: 60 tablet, Refills: 1     !! - Potential duplicate medications found. Please discuss with provider.    CONTINUE these medications which have NOT CHANGED   Details  aspirin 81 MG tablet Take 81 mg by mouth daily.    atorvastatin (LIPITOR) 40 MG tablet TAKE 1 TABLET BY MOUTH DAILY  Qty: 30 tablet, Refills: 11    donepezil (ARICEPT) 5 MG tablet TAKE 1 TABLET(5 MG) BY MOUTH AT BEDTIME Qty: 30 tablet, Refills: 11    doxazosin (CARDURA) 2 MG tablet Take 2 mg by mouth daily.     Insulin Syringe-Needle U-100 (INSULIN SYRINGE 1CC/30GX5/16") 30G X 5/16" 1 ML MISC USE TWICE DAILY OR AS DIRECTED Qty: 100 each, Refills: 0    lisinopril (PRINIVIL,ZESTRIL) 20 MG tablet TAKE 1 TABLET BY MOUTH EVERY DAY Qty: 30 tablet, Refills: 0    tamsulosin (FLOMAX) 0.4 MG CAPS capsule TAKE ONE CAPSULE BY MOUTH EVERY DAY Qty: 30 capsule, Refills: 11    vitamin B-12 100 MCG tablet Take 1 tablet (100 mcg total) by mouth daily. Qty: 30 tablet, Refills: 0    nitroGLYCERIN (NITROSTAT) 0.4 MG SL tablet Place 1 tablet (0.4 mg total) under the tongue every 5 (five) minutes as needed for chest pain. Qty: 25 tablet, Refills: 6      STOP taking these medications     insulin regular (NOVOLIN R) 100 units/mL injection        Allergies  Allergen Reactions  . Morphine And Related Itching  . Penicillins Hives    Follow-up Information    Follow up with Elby Showers, MD In 1 week.   Specialty:  Internal Medicine   Contact information:   [address] [id number] [phone]        The results of significant diagnostics from this hospitalization (including imaging, microbiology, ancillary and laboratory) are listed below for reference.    Significant Diagnostic Studies: Dg Chest Port 1 View  10/14/2015  CLINICAL DATA:  Unresponsive, hypoglycemic EXAM: PORTABLE CHEST - 1 VIEW COMPARISON:  05/25/2015 FINDINGS: The heart size and mediastinal contours are within normal limits. Both lungs are clear. The visualized skeletal structures are unremarkable. IMPRESSION: No active disease. Electronically Signed   By: Inez Catalina M.D.   On: 10/14/2015 12:59    Microbiology: Recent Results (from the past 240 hour(s))  Blood Culture (routine x 2)     Status: None (Preliminary result)   Collection Time: 10/14/15 11:45 AM  Result Value Ref Range Status   Specimen Description BLOOD LEFT ANTECUBITAL  Final   Special Requests BOTTLES DRAWN AEROBIC AND ANAEROBIC 5CCS  Final   Culture  Setup Time   Final    GRAM POSITIVE COCCI IN CLUSTERS ANAEROBIC BOTTLE ONLY CRITICAL RESULT CALLED TO, READ BACK BY AND VERIFIED WITH: J. ADAMS,RN AT [id number] ON [id number] BY Rhea Bleacher    Culture NO GROWTH 1 DAY  Final   Report Status PENDING  Incomplete  Blood Culture (routine x 2)     Status: None (Preliminary result)   Collection Time: 10/14/15 11:55 AM  Result Value Ref Range Status   Specimen Description BLOOD LEFT WRIST  Final   Special Requests BOTTLES DRAWN AEROBIC AND ANAEROBIC 5CCS  Final   Culture NO GROWTH 1 DAY  Final   Report Status PENDING  Incomplete  Urine culture     Status: None   Collection Time: 10/14/15  1:00 PM  Result Value Ref Range Status   Specimen Description URINE, CLEAN CATCH  Final   Special Requests NONE  Final   Culture MULTIPLE SPECIES PRESENT, SUGGEST RECOLLECTION   Final   Report Status 10/15/2015 FINAL  Final     Labs: Basic Metabolic Panel:  Recent Labs Lab 10/14/15 1213 10/15/15 0731  NA 142 142  K 3.8 3.9  CL 106 109  CO2 26  25  GLUCOSE 111* 146*  BUN 11 8  CREATININE 1.20 1.11  CALCIUM 8.7* 8.2*   Liver Function Tests:  Recent Labs Lab 10/14/15 1213  AST 30  ALT 18  ALKPHOS 69  BILITOT 1.0  PROT 5.9*  ALBUMIN 3.1*   No results for input(s): LIPASE, AMYLASE in the last 168 hours. No results for input(s): AMMONIA in the last 168 hours. CBC:  Recent Labs Lab 10/14/15 1213 10/15/15 0731  WBC 10.7* 7.2  NEUTROABS 9.1*  --   HGB 16.4 16.3  HCT 48.5 48.1  MCV 95.5 96.4  PLT 110* 101*   Cardiac Enzymes:  Recent Labs Lab 10/14/15 2011  TROPONINI 0.03   BNP: BNP (last 3 results) No results for input(s): BNP in the last 8760 hours.  ProBNP (last 3 results) No results for input(s): PROBNP in the last 8760 hours.  CBG:  Recent Labs Lab 10/15/15 1703 10/15/15 1704 10/15/15 2126 10/16/15 0802 10/16/15 0914  GLUCAP 219* 212* 136* 64* 239*       Signed:  Kelvin Cellar MD.  [facility] 10/16/2015, 11:18 AM

## 2015-10-16 NOTE — Care Management Note (Signed)
Case Management Note  Patient Details  Name: Joseph Mora MRN: [id number] Date of Birth: 04-15-1933  Subjective/Objective:       CM following for progression and d/c planning.             Action/Plan: 10/16/2015 Noted referral for Insulin cost on 10/15/2015, Per case manager assistance this pt insurance requires a copay of $45 each for NPH and Regular insulin. Humilin has moved to another tier for this pt. This CM spoke with diabetes educator, Gwen Pounds who has met with this pt and his wife and given then recommendations re best places to purchase NPH and Reg Insulin at significant cost savings as compared to his insurance copays, and his supplies. Pt and wife provided with education and hand outs by Diabetes educator.   Expected Discharge Date:   10/16/2015           Expected Discharge Plan:  Home/Self Care  In-House Referral:  NA  Discharge planning Services  CM Consult  Post Acute Care Choice:  NA Choice offered to:  NA  DME Arranged:   NA DME Agency:   NA  HH Arranged:   NA HH Agency:   NA  Status of Service:  Completed, signed off  Medicare Important Message Given:    Date Medicare IM Given:    Medicare IM give by:    Date Additional Medicare IM Given:    Additional Medicare Important Message give by:     If discussed at [place] of Stay Meetings, dates discussed:    Additional Comments:  Adron Bene, RN 10/16/2015, 10:47 AM

## 2015-10-17 ENCOUNTER — Other Ambulatory Visit: Payer: Self-pay | Admitting: Internal Medicine

## 2015-10-17 LAB — CULTURE, BLOOD (ROUTINE X 2)

## 2015-10-17 NOTE — Care Management Note (Signed)
Case Management Note                           LATE ENTRY 10/17/2015  Patient Details  Name: Joseph Mora MRN: [id number] Date of Birth: [birth year]  Subjective/Objective:       CM followup for progression and d/c planning.             Action/Plan: 10/17/2015 This CM received orders for [facility] services for this pt on 10/16/15, however upon going to pt room , pt had d/c to home. This CM placed call to pt home this am 10/17/2015 and spoke with pt wife re [facility] services. Mrs Battiest was informed of orders and of copay of approx $25 per visit. Mrs Hickingbottom stated that he has had [facility] services previously from [facility] and did so well post hospital visit that [facility] made very few visits before releasing him. At this time she is declining services and Mr Winkel will see his PCP ( Dr Lenice Llamas) on Friday, per Mrs Sonnier and is they feel at that time that he would benefit from [facility], they will request an order from Dr Renold Genta.   Expected Discharge Date:     10/16/2015             Expected Discharge Plan:  Home/Self Care  In-House Referral:  NA  Discharge planning Services  CM Consult  Post Acute Care Choice:  NA Choice offered to:  NA  DME Arranged:   NA DME Agency:   NA  HH Arranged:   Declined at this time [place]:   NA  Status of Service:  Completed, signed off  Medicare Important Message Given:    Date Medicare IM Given:    Medicare IM give by:    Date Additional Medicare IM Given:    Additional Medicare Important Message give by:     If discussed at [place] of Stay Meetings, dates discussed:    Additional Comments:  Adron Bene, RN 10/17/2015, 10:36 AM

## 2015-10-18 ENCOUNTER — Other Ambulatory Visit: Payer: Self-pay | Admitting: Internal Medicine

## 2015-10-19 LAB — CULTURE, BLOOD (ROUTINE X 2): CULTURE: NO GROWTH

## 2015-10-26 ENCOUNTER — Ambulatory Visit (INDEPENDENT_AMBULATORY_CARE_PROVIDER_SITE_OTHER): Payer: PPO | Admitting: Internal Medicine

## 2015-10-26 ENCOUNTER — Encounter: Payer: Self-pay | Admitting: Internal Medicine

## 2015-10-26 VITALS — BP 138/78 | HR 84 | Temp 97.3°F | Resp 20 | Wt 197.0 lb

## 2015-10-26 DIAGNOSIS — I519 Heart disease, unspecified: Secondary | ICD-10-CM | POA: Diagnosis not present

## 2015-10-26 DIAGNOSIS — E162 Hypoglycemia, unspecified: Secondary | ICD-10-CM | POA: Diagnosis not present

## 2015-10-26 DIAGNOSIS — G8929 Other chronic pain: Secondary | ICD-10-CM | POA: Diagnosis not present

## 2015-10-26 DIAGNOSIS — Z794 Long term (current) use of insulin: Secondary | ICD-10-CM | POA: Diagnosis not present

## 2015-10-26 DIAGNOSIS — I10 Essential (primary) hypertension: Secondary | ICD-10-CM | POA: Diagnosis not present

## 2015-10-26 DIAGNOSIS — M545 Low back pain, unspecified: Secondary | ICD-10-CM

## 2015-10-26 DIAGNOSIS — IMO0001 Reserved for inherently not codable concepts without codable children: Secondary | ICD-10-CM

## 2015-10-26 DIAGNOSIS — E119 Type 2 diabetes mellitus without complications: Secondary | ICD-10-CM | POA: Diagnosis not present

## 2015-10-26 DIAGNOSIS — N400 Enlarged prostate without lower urinary tract symptoms: Secondary | ICD-10-CM

## 2015-10-26 DIAGNOSIS — E785 Hyperlipidemia, unspecified: Secondary | ICD-10-CM

## 2015-10-26 DIAGNOSIS — R413 Other amnesia: Secondary | ICD-10-CM

## 2015-10-27 ENCOUNTER — Other Ambulatory Visit: Payer: Self-pay | Admitting: Internal Medicine

## 2015-11-07 DIAGNOSIS — E1165 Type 2 diabetes mellitus with hyperglycemia: Secondary | ICD-10-CM | POA: Diagnosis not present

## 2015-11-07 DIAGNOSIS — R609 Edema, unspecified: Secondary | ICD-10-CM | POA: Diagnosis not present

## 2015-11-07 DIAGNOSIS — E7800 Pure hypercholesterolemia, unspecified: Secondary | ICD-10-CM | POA: Diagnosis not present

## 2015-11-07 DIAGNOSIS — I10 Essential (primary) hypertension: Secondary | ICD-10-CM | POA: Diagnosis not present

## 2015-11-17 ENCOUNTER — Other Ambulatory Visit: Payer: Self-pay | Admitting: Internal Medicine

## 2015-11-19 ENCOUNTER — Ambulatory Visit (INDEPENDENT_AMBULATORY_CARE_PROVIDER_SITE_OTHER): Payer: PPO | Admitting: Ophthalmology

## 2015-11-19 DIAGNOSIS — E113593 Type 2 diabetes mellitus with proliferative diabetic retinopathy without macular edema, bilateral: Secondary | ICD-10-CM | POA: Diagnosis not present

## 2015-11-19 DIAGNOSIS — I10 Essential (primary) hypertension: Secondary | ICD-10-CM

## 2015-11-19 DIAGNOSIS — H43813 Vitreous degeneration, bilateral: Secondary | ICD-10-CM | POA: Diagnosis not present

## 2015-11-19 DIAGNOSIS — H35033 Hypertensive retinopathy, bilateral: Secondary | ICD-10-CM

## 2015-11-19 DIAGNOSIS — E11319 Type 2 diabetes mellitus with unspecified diabetic retinopathy without macular edema: Secondary | ICD-10-CM | POA: Diagnosis not present

## 2015-11-20 ENCOUNTER — Encounter: Payer: Self-pay | Admitting: Internal Medicine

## 2015-11-20 NOTE — Progress Notes (Signed)
Subjective:    Patient ID: Joseph Mora, male    DOB: 1933-05-16, 82 y.o.   MRN: [id number]  HPI Patient was hospitalized recently with episode of severe hypoglycemia. He was found unresponsive and EMS personnel were contacted.. Blood sugar in the field was 17 and he was given dextrose and improved. In the hospital, his diabetic regimen was changed. He was placed on NPH insulin 22 units every morning and 12 units before supper with sliding scale. Patient had positive blood cultures it proved to be contaminants. These were gram-positive cocci. He was given vancomycin until results were obtained. Metoprolol was decreased to 12.5 mg twice daily.  He is on Lisinopril, Flomax, when necessary nitroglycerin, Cardura, Aricept in addition to insulin and metoprolol    Past medical history: Developed confusional state in the fall of 2016 and was hospitalized after being stung by a number of fire ants. Has seen neurologist in his been placed on memory loss medication. Wife says his memory is not nearly as good as it used to be. He is forgetful.  He has a history of coronary artery disease, hyperlipidemia, BPH, chronic back pain, history of adenomatous colon polyps, hypertension.  In September 1990, he had PTCA of a 100% occluded circumflex artery. He had cardiac cath with stent placement to right coronary artery September 2000. Last stress test was in 2011 and was negative.  History of CMV infection 1997 which he initially thought was a reaction to Pneumovax immunization.  History of H. pylori gastritis March 1999. Had screening colonoscopy by Dr. Collene Mares in October 2000. History of adenomatous colon polyps.  Right inguinal hernia repair 1953, lumbar disc surgery L4-L5 and L5-S1 1998. Cataract extraction of right eye 2006, surgery for chronic left ulnar neuropathy due to diabetes and a left carpal tunnel release done by Dr. Daylene Katayama March 2012  When he was hospitalized for back surgery 1999 he developed a  GI bleed requiring emergency surgery with gastroduodenostomy and pyloroplasty. He also had a truncal vagotomy. A large bleeding duodenal ulcer was detected and that is when H. pylori was diagnosed.  He does not consume alcohol. He quit smoking in 1990. He is to try to walk daily but because of musculoskeletal pain particular back pain he is not able to do that very much. Social history: He is married. He has adult children and has raised a number of foster children over the years. He is retired Associate Professor for [employer]. Wife is a homemaker.  Family history: Father died at age 61 struck by motor vehicle. Mother died at age 53 with history of diabetes mellitus, stroke and Alzheimer's disease. One brother died at 24 with history of colon cancer, alcoholism and MI. 7 sisters and 7 brothers living. One brother with adult onset diabetes. 3 sisters with diabetes mellitus.  Currently not on the care of endocrinologist. Has not had significant hypoglycemia recently. He does not know what triggered this event.  Review of Systems says he feels much better now. Walks with a cane. Chronic back pain.     Objective:   Physical Exam  Constitutional: He is oriented to person, place, and time. No distress.  HENT:  Head: Normocephalic and atraumatic.  Mouth/Throat: Oropharynx is clear and moist.  Eyes: Pupils are equal, round, and reactive to light. Right eye exhibits no discharge. Left eye exhibits no discharge. No scleral icterus.  Neck: Neck supple. No JVD present. No thyromegaly present.  Cardiovascular: Normal rate, regular rhythm and normal heart  sounds.   No murmur heard. Pulmonary/Chest: Effort normal and breath sounds normal.  Abdominal: Soft. Bowel sounds are normal. He exhibits no distension and no mass. There is no tenderness.  Musculoskeletal: He exhibits no edema.  Lymphadenopathy:    He has no cervical adenopathy.  Neurological: He is alert and oriented to person, place,  and time. He has normal reflexes. He displays normal reflexes. No cranial nerve deficit. Coordination normal.  Skin: Skin is warm and dry. No rash noted. He is not diaphoretic.  Psychiatric: He has a normal mood and affect. His behavior is normal. Judgment and thought content normal.  Vitals reviewed.         Assessment & Plan:  Acute episode of profound hypoglycemia  Insulin-dependent diabetes  History of low back pain  BPH  Essential hypertension  Hyperlipidemia  History of coronary disease  Memory loss  Plan: We'll refer patient endocrinologist for evaluation of his current insulin regimen. He is generally seen here every 6 months.

## 2015-11-20 NOTE — Patient Instructions (Signed)
Endocrinology referral for evaluation of insulin regimen

## 2015-11-22 ENCOUNTER — Encounter: Payer: Self-pay | Admitting: Neurology

## 2015-11-22 ENCOUNTER — Ambulatory Visit (INDEPENDENT_AMBULATORY_CARE_PROVIDER_SITE_OTHER): Payer: PPO | Admitting: Neurology

## 2015-11-22 VITALS — BP 133/78 | HR 77 | Ht 65.0 in | Wt 194.0 lb

## 2015-11-22 DIAGNOSIS — R251 Tremor, unspecified: Secondary | ICD-10-CM

## 2015-11-22 DIAGNOSIS — R413 Other amnesia: Secondary | ICD-10-CM

## 2015-11-22 MED ORDER — DONEPEZIL HCL 10 MG PO TABS: 10.0000 mg | ORAL_TABLET | Freq: Every day | ORAL | Status: DC

## 2015-11-22 NOTE — Patient Instructions (Signed)
Tremor °A tremor is trembling or shaking that you cannot control. Most tremors affect the hands or arms. Tremors can also affect the head, vocal cords, face, and other parts of the body. There are many types of tremors. Common types include:  °· Essential tremor. These usually occur in people over the age of 40. It may run in families and can happen in otherwise healthy people.   °· Resting tremor. These occur when the muscles are at rest, such as when your hands are resting in your lap. People with Parkinson disease often have resting tremors.   °· Postural tremor. These occur when you try to hold a pose, such as keeping your hands outstretched.   °· Kinetic tremor. These occur during purposeful movement, such as trying to touch a finger to your nose.   °· Task-specific tremor. These may occur when you perform tasks such as handwriting, speaking, or standing.   °· Psychogenic tremor. These dramatically lessen or disappear when you are distracted. They can happen in people of all ages.   °Some types of tremors have no known cause. Tremors can also be a symptom of nervous system problems (neurological disorders) that may occur with aging. Some tremors go away with treatment while others do not.  °HOME CARE INSTRUCTIONS °Watch your tremor for any changes. The following actions may help to lessen any discomfort you are feeling: °· Take medicines only as directed by your health care provider.   °· Limit alcohol intake to no more than 1 drink per day for nonpregnant women and 2 drinks per day for men. One drink equals 12 oz of beer, 5 oz of wine, or 1½ oz of hard liquor. °· Do not use any tobacco products, including cigarettes, chewing tobacco, or electronic cigarettes. If you need help quitting, ask your health care provider.   °· Avoid extreme heat or cold.    °· Limit the amount of caffeine you consume as directed by your health care provider.   °· Try to get 8 hours of sleep each night. °· Find ways to manage your  stress, such as meditation or yoga. °· Keep all follow-up visits as directed by your health care provider. This is important. °SEEK MEDICAL CARE IF: °· You start having a tremor after starting a new medicine. °· You have tremor with other symptoms such as: °¨ Numbness. °¨ Tingling. °¨ Pain. °¨ Weakness. °· Your tremor gets worse. °· Your tremor interferes with your day-to-day life. °  °This information is not intended to replace advice given to you by your health care provider. Make sure you discuss any questions you have with your health care provider. °  °Document Released: 08/29/2002 Document Revised: 09/29/2014 Document Reviewed: 03/06/2014 °Elsevier Interactive Patient Education ©2016 Elsevier Inc. ° °

## 2015-11-22 NOTE — Progress Notes (Signed)
Reason for visit: Memory disturbance  Joseph Mora is an 82 y.o. male  History of present illness:  Joseph Mora is an 82 year old right-handed white male with a history of diabetes and a memory disturbance. The patient was recently in the hospital on the 24th of January, 2017 with an episode of hypoglycemia with a blood sugar down to 17. The patient indicates that he did not feel the blood sugar dropping, he just became confused and unresponsive. The patient has recovered from this. He was seen through this office on 06/22/2015, at that time he did not wish to consider going on medication for memory. Apparently his primary care physician has gotten him on low-dose Aricept at 5 mg at night in the meantime. He is tolerating the medication fairly well. The patient has tremors of both arms, with a prominent resting tremor of the right upper extremity. The patient has a gait disorder that mainly is associated with is right hip arthritis. The patient uses a cane for ambulation. He has limitations in the duration of walking because of pain in the hip. He has not had any falls. He returns this office for an evaluation.  Past Medical History  Diagnosis Date  . CAD (coronary artery disease)   . Hyperlipidemia   . Diabetes mellitus   . Myocardial infarction ([place])   . Arthritis     RA  . Memory difficulty 06/22/2015  . Tremor 06/22/2015    Intentional, right hand    Past Surgical History  Procedure Laterality Date  . Carpal tunnel release Bilateral   . Parathyroidectomy    . Cataract extraction Bilateral   . Inguinal hernia repair    . Back surgery      Family History  Problem Relation Age of Onset  . Heart attack Paternal Aunt   . Stroke Mother   . Diabetes Mother   . Dementia Mother   . Diabetes Sister   . Stroke Sister   . Dementia Sister   . Diabetes Brother   . Dementia Brother   . Hypertension Neg Hx     Social history:  reports that he quit smoking about 5 years ago. His  smokeless tobacco use includes Chew. He reports that he does not drink alcohol or use illicit drugs.    Allergies  Allergen Reactions  . Morphine And Related Itching  . Penicillins Hives    Medications:  Prior to Admission medications   Medication Sig Start Date End Date Taking? Authorizing Provider  aspirin 81 MG tablet Take 81 mg by mouth daily. 10/01/11  Yes Burnell Blanks, MD  atorvastatin (LIPITOR) 40 MG tablet TAKE 1 TABLET BY MOUTH DAILY 07/16/15  Yes Elby Showers, MD  donepezil (ARICEPT) 5 MG tablet TAKE 1 TABLET(5 MG) BY MOUTH AT BEDTIME 09/11/15  Yes Elby Showers, MD  doxazosin (CARDURA) 2 MG tablet TAKE 1 TABLET BY MOUTH EVERY NIGHT AT BEDTIME 10/29/15  Yes Elby Showers, MD  doxazosin (CARDURA) 2 MG tablet TAKE 1 TABLET BY MOUTH EVERY NIGHT AT BEDTIME 11/18/15  Yes Elby Showers, MD  lisinopril (PRINIVIL,ZESTRIL) 20 MG tablet TAKE 1 TABLET BY MOUTH EVERY DAY 10/19/15  Yes Elby Showers, MD  metoprolol tartrate (LOPRESSOR) 25 MG tablet Take 25 mg by mouth 2 (two) times daily.   Yes Historical Provider, MD  nitroGLYCERIN (NITROSTAT) 0.4 MG SL tablet Place 1 tablet (0.4 mg total) under the tongue every 5 (five) minutes as needed for chest pain. 03/31/14  Yes Burnell Blanks, MD  NOVOLOG MIX 70/30 FLEXPEN (70-30) 100 UNIT/ML FlexPen 50 units in am, 35 units in pm 11/07/15  Yes Historical Provider, MD  tamsulosin (FLOMAX) 0.4 MG CAPS capsule TAKE ONE CAPSULE BY MOUTH EVERY DAY 02/22/15  Yes Elby Showers, MD  vitamin B-12 100 MCG tablet Take 1 tablet (100 mcg total) by mouth daily. 06/01/15  Yes Florencia Reasons, MD    ROS:  Out of a complete 14 system review of symptoms, the patient complains only of the following symptoms, and all other reviewed systems are negative.  Hearing loss Cough Cold intolerance Sleep talking Frequency of urination Back pain, walking difficulty Itching Tremors Confusion, decreased concentration  Blood pressure 133/78, pulse 77, height 5\' 5"  (1.651  m), weight 194 lb (87.998 kg).  Physical Exam  General: The patient is alert and cooperative at the time of the examination.  Skin: No significant peripheral edema is noted.   Neurologic Exam  Mental status: The patient is alert and oriented x 2 at the time of the examination (not oriented to date). The Mini-Mental Status Examination done today shows a total score of 18/30. The patient is able to name 13 animals in early seconds..   Cranial nerves: Facial symmetry is present. Speech is normal, no aphasia or dysarthria is noted. Extraocular movements are full. Visual fields are full.  Motor: The patient has good strength in all 4 extremities.  Sensory examination: Soft touch sensation is symmetric on the face, arms, and legs.  Coordination: The patient has good finger-nose-finger and heel-to-shin bilaterally. There appears to be a mild intention tremor with both upper extremities with finger nose finger. The patient has a prominent resting tremor with the right upper extremity.  Gait and station: The patient has a wide-based gait, the patient walks with a cane. Tandem gait was not attempted. Romberg is negative. No drift is seen.  Reflexes: Deep tendon reflexes are symmetric, but are depressed.   Assessment/Plan:  1. Memory disturbance  2. Tremor  3. Gait disorder  The patient believes that his memory issues have remained relatively stable since last seen. The Mini-Mental Status Examination confirms this, it has been stable. The patient had an episode of hypoglycemia since last seen. The patient reports that he has not had previous episodes of hypoglycemia. The patient does have tremors, he appears to have an intention tremor in both arms, and a resting tremor in the right arm. He does not have any other features of parkinsonism at this time. The Aricept will be increased to the 10 mg maintenance dose, he will follow-up in 6 months. We will continue to look for developing  parkinsonism and for any progression in the memory issue.  Jill Alexanders MD 11/22/2015 7:09 PM  [facility] [address] [place], [place] [address]  Phone [phone] Fax [phone]

## 2015-12-04 ENCOUNTER — Telehealth: Payer: Self-pay | Admitting: Internal Medicine

## 2015-12-04 NOTE — Telephone Encounter (Signed)
Mail order pharmacy will no longer cover the test strips and lancets for patient.  Please send to [facility].  One Touch Ultra II - Test Strips and Lancets  Pharmacy:  [facility] @ [address] and [place] with Dr. Renold Genta.  She wrote a Rx for patient.  Faxed Rx to [facility] for Lancets and Test Strips for BID testing.  Refills for 1 year.   Fax [phone]

## 2015-12-06 ENCOUNTER — Other Ambulatory Visit: Payer: Self-pay

## 2015-12-06 MED ORDER — GLUCOSE BLOOD VI STRP: ORAL_STRIP | Status: DC

## 2015-12-06 MED ORDER — ONETOUCH ULTRA SYSTEM W/DEVICE KIT: 1.0000 | PACK | Freq: Once | Status: DC

## 2015-12-14 ENCOUNTER — Other Ambulatory Visit: Payer: Self-pay | Admitting: Internal Medicine

## 2016-01-29 ENCOUNTER — Ambulatory Visit (INDEPENDENT_AMBULATORY_CARE_PROVIDER_SITE_OTHER): Payer: PPO | Admitting: Internal Medicine

## 2016-01-29 ENCOUNTER — Encounter: Payer: Self-pay | Admitting: Internal Medicine

## 2016-01-29 VITALS — BP 108/64 | HR 62 | Temp 97.0°F | Resp 18 | Ht 65.0 in | Wt 191.5 lb

## 2016-01-29 DIAGNOSIS — E785 Hyperlipidemia, unspecified: Secondary | ICD-10-CM | POA: Diagnosis not present

## 2016-01-29 DIAGNOSIS — Z794 Long term (current) use of insulin: Secondary | ICD-10-CM | POA: Diagnosis not present

## 2016-01-29 DIAGNOSIS — R258 Other abnormal involuntary movements: Secondary | ICD-10-CM | POA: Diagnosis not present

## 2016-01-29 DIAGNOSIS — IMO0001 Reserved for inherently not codable concepts without codable children: Secondary | ICD-10-CM

## 2016-01-29 DIAGNOSIS — R413 Other amnesia: Secondary | ICD-10-CM | POA: Diagnosis not present

## 2016-01-29 DIAGNOSIS — G250 Essential tremor: Secondary | ICD-10-CM

## 2016-01-29 DIAGNOSIS — I10 Essential (primary) hypertension: Secondary | ICD-10-CM | POA: Diagnosis not present

## 2016-01-29 DIAGNOSIS — N400 Enlarged prostate without lower urinary tract symptoms: Secondary | ICD-10-CM

## 2016-01-29 DIAGNOSIS — G8929 Other chronic pain: Secondary | ICD-10-CM

## 2016-01-29 DIAGNOSIS — I519 Heart disease, unspecified: Secondary | ICD-10-CM

## 2016-01-29 DIAGNOSIS — M545 Low back pain: Secondary | ICD-10-CM | POA: Diagnosis not present

## 2016-01-29 DIAGNOSIS — E119 Type 2 diabetes mellitus without complications: Secondary | ICD-10-CM | POA: Diagnosis not present

## 2016-01-29 DIAGNOSIS — G252 Other specified forms of tremor: Secondary | ICD-10-CM

## 2016-01-29 DIAGNOSIS — R259 Unspecified abnormal involuntary movements: Secondary | ICD-10-CM

## 2016-02-05 DIAGNOSIS — I10 Essential (primary) hypertension: Secondary | ICD-10-CM | POA: Diagnosis not present

## 2016-02-05 DIAGNOSIS — E7800 Pure hypercholesterolemia, unspecified: Secondary | ICD-10-CM | POA: Diagnosis not present

## 2016-02-05 DIAGNOSIS — E1165 Type 2 diabetes mellitus with hyperglycemia: Secondary | ICD-10-CM | POA: Diagnosis not present

## 2016-02-12 ENCOUNTER — Encounter: Payer: Self-pay | Admitting: Podiatry

## 2016-02-12 ENCOUNTER — Ambulatory Visit (INDEPENDENT_AMBULATORY_CARE_PROVIDER_SITE_OTHER): Payer: PPO | Admitting: Podiatry

## 2016-02-12 VITALS — BP 142/88 | HR 69 | Resp 12

## 2016-02-12 DIAGNOSIS — B351 Tinea unguium: Secondary | ICD-10-CM | POA: Diagnosis not present

## 2016-02-12 DIAGNOSIS — M79674 Pain in right toe(s): Secondary | ICD-10-CM | POA: Diagnosis not present

## 2016-02-12 DIAGNOSIS — M79675 Pain in left toe(s): Secondary | ICD-10-CM

## 2016-02-12 NOTE — Progress Notes (Signed)
   Subjective:    Patient ID: Joseph Mora, male    DOB: 08-31-1933, 82 y.o.   MRN: [id number]  HPI    Patient presents today with his wife present to treatment room complaining of thickened and elongated toenails over the past 5 years which have gradually become more thickened and deformed over time making shoe wearing walking uncomfortable and is requesting toenail debridement  Patient is a diabetic and denies history of foot ulceration, claudication or amputation  Review of Systems  Skin: Positive for color change.       Objective:   Physical Exam  Vascular: Bilateral peripheral pitting edema DP pulses 0/4 bilaterally PT pulses 0/4 bilaterally  Neurological: Sensation to 10 g monofilament wire intact 3/5 right 2/5 left Vibratory sensation nonreactive right reactive left Ankle reflex equal and reactive bilaterally  Dermatological: No open skin lesions bilaterally Dry scaling skin bilaterally The toenails are elongated, brittle, deformed and tender direct palpation 6-10  Musculoskeletal: No deformities noted bilaterally There is no restriction ankle, subtalar, midtarsal joints bilaterally       Assessment & Plan:   Assessment: Diabetic peripheral arterial disease Diabetic peripheral neuropathy Neglected symptomatic onychomycoses 6-10  Plan: Today I reviewed the results of examination with patient today. As patient has no open wounds or history of open wounds will continue to monitor patient on at three-month intervals.  The toenails 6-10 were debrided mechanically and electrically without any bleeding

## 2016-02-12 NOTE — Patient Instructions (Signed)
Diabetes and Foot Care Diabetes may cause you to have problems because of poor blood supply (circulation) to your feet and legs. This may cause the skin on your feet to become thinner, break easier, and heal more slowly. Your skin may become dry, and the skin may peel and crack. You may also have nerve damage in your legs and feet causing decreased feeling in them. You may not notice minor injuries to your feet that could lead to infections or more serious problems. Taking care of your feet is one of the most important things you can do for yourself.  HOME CARE INSTRUCTIONS  Wear shoes at all times, even in the house. Do not go barefoot. Bare feet are easily injured.  Check your feet daily for blisters, cuts, and redness. If you cannot see the bottom of your feet, use a mirror or ask someone for help.  Wash your feet with warm water (do not use hot water) and mild soap. Then pat your feet and the areas between your toes until they are completely dry. Do not soak your feet as this can dry your skin.  Apply a moisturizing lotion or petroleum jelly (that does not contain alcohol and is unscented) to the skin on your feet and to dry, brittle toenails. Do not apply lotion between your toes.  Trim your toenails straight across. Do not dig under them or around the cuticle. File the edges of your nails with an emery board or nail file.  Do not cut corns or calluses or try to remove them with medicine.  Wear clean socks or stockings every day. Make sure they are not too tight. Do not wear knee-high stockings since they may decrease blood flow to your legs.  Wear shoes that fit properly and have enough cushioning. To break in new shoes, wear them for just a few hours a day. This prevents you from injuring your feet. Always look in your shoes before you put them on to be sure there are no objects inside.  Do not cross your legs. This may decrease the blood flow to your feet.  If you find a minor scrape,  cut, or break in the skin on your feet, keep it and the skin around it clean and dry. These areas may be cleansed with mild soap and water. Do not cleanse the area with peroxide, alcohol, or iodine.  When you remove an adhesive bandage, be sure not to damage the skin around it.  If you have a wound, look at it several times a day to make sure it is healing.  Do not use heating pads or hot water bottles. They may burn your skin. If you have lost feeling in your feet or legs, you may not know it is happening until it is too late.  Make sure your health care provider performs a complete foot exam at least annually or more often if you have foot problems. Report any cuts, sores, or bruises to your health care provider immediately. SEEK MEDICAL CARE IF:   You have an injury that is not healing.  You have cuts or breaks in the skin.  You have an ingrown nail.  You notice redness on your legs or feet.  You feel burning or tingling in your legs or feet.  You have pain or cramps in your legs and feet.  Your legs or feet are numb.  Your feet always feel cold. SEEK IMMEDIATE MEDICAL CARE IF:   There is increasing redness,   swelling, or pain in or around a wound.  There is a red line that goes up your leg.  Pus is coming from a wound.  You develop a fever or as directed by your health care provider.  You notice a bad smell coming from an ulcer or wound.   This information is not intended to replace advice given to you by your health care provider. Make sure you discuss any questions you have with your health care provider.   Document Released: 09/05/2000 Document Revised: 05/11/2013 Document Reviewed: 02/15/2013 Elsevier Interactive Patient Education 2016 Elsevier Inc.  

## 2016-02-14 ENCOUNTER — Other Ambulatory Visit: Payer: Self-pay | Admitting: Internal Medicine

## 2016-02-20 NOTE — Patient Instructions (Signed)
It was pleasure to see you today. Continue same medications and return in 6 months. See podiatrist for toenail clipping

## 2016-02-20 NOTE — Progress Notes (Signed)
   Subjective:    Patient ID: Joseph Mora, male    DOB: 1933/07/28, 82 y.o.   MRN: [id number]  HPI Joseph Mora is now seeing Dr. Chalmers Cater for insulin-dependent diabetes mellitus. He doesn't look quite as alert and talkative as he usually does. He may be developing some dementia. He is on memory loss medication per neurologist. He has a history of heart disease, BPH, hyperlipidemia. Wife says he has become more sedentary. Apparently his adopted son weight saw him a great deal. He has chronic low back pain. Cannot walk as much as he used to. At one point he had an episode of profound hypoglycemia and was hospitalized. He has had no further episodes. They're having some issues with his insulin. He apparently is in the doughnut hole. They may needs me to Dr. York Spaniel about alternatives. Joseph Mora may need to investigate [facility] insulin. I think the issue is coverage of a flex pen. He has some issues with his toenails that are quite long. Have suggested he be seen at [place] to have these nails filed are clipped.  No new complaints    Review of Systems see above     Objective:   Physical Exam  Skin warm and dry. Nodes none. Neck is supple without JVD thyromegaly or carotid bruits. Chest clear to auscultation. Cardiac exam regular rate and rhythm normal S1 and S2. Extremities without edema.      Assessment & Plan:  Insulin-dependent diabetes mellitus  BPH  Chronic low back pain  Hyperlipidemia  History of heart disease  Essential hypertension-stable  Memory loss  Tremors-appears to have a resting tremor and an intention tremor. Seen by Dr. Jannifer Franklin. No evidence of Parkinson's disease.  Plan: Continue same medications and return in 6 months

## 2016-03-14 ENCOUNTER — Other Ambulatory Visit: Payer: Self-pay | Admitting: Internal Medicine

## 2016-05-14 ENCOUNTER — Ambulatory Visit (INDEPENDENT_AMBULATORY_CARE_PROVIDER_SITE_OTHER): Payer: PPO | Admitting: Podiatry

## 2016-05-14 ENCOUNTER — Encounter: Payer: Self-pay | Admitting: Podiatry

## 2016-05-14 DIAGNOSIS — M79674 Pain in right toe(s): Secondary | ICD-10-CM | POA: Diagnosis not present

## 2016-05-14 DIAGNOSIS — B351 Tinea unguium: Secondary | ICD-10-CM

## 2016-05-14 DIAGNOSIS — M79675 Pain in left toe(s): Secondary | ICD-10-CM | POA: Diagnosis not present

## 2016-05-14 NOTE — Patient Instructions (Signed)
Diabetes and Foot Care Diabetes may cause you to have problems because of poor blood supply (circulation) to your feet and legs. This may cause the skin on your feet to become thinner, break easier, and heal more slowly. Your skin may become dry, and the skin may peel and crack. You may also have nerve damage in your legs and feet causing decreased feeling in them. You may not notice minor injuries to your feet that could lead to infections or more serious problems. Taking care of your feet is one of the most important things you can do for yourself.  HOME CARE INSTRUCTIONS  Wear shoes at all times, even in the house. Do not go barefoot. Bare feet are easily injured.  Check your feet daily for blisters, cuts, and redness. If you cannot see the bottom of your feet, use a mirror or ask someone for help.  Wash your feet with warm water (do not use hot water) and mild soap. Then pat your feet and the areas between your toes until they are completely dry. Do not soak your feet as this can dry your skin.  Apply a moisturizing lotion or petroleum jelly (that does not contain alcohol and is unscented) to the skin on your feet and to dry, brittle toenails. Do not apply lotion between your toes.  Trim your toenails straight across. Do not dig under them or around the cuticle. File the edges of your nails with an emery board or nail file.  Do not cut corns or calluses or try to remove them with medicine.  Wear clean socks or stockings every day. Make sure they are not too tight. Do not wear knee-high stockings since they may decrease blood flow to your legs.  Wear shoes that fit properly and have enough cushioning. To break in new shoes, wear them for just a few hours a day. This prevents you from injuring your feet. Always look in your shoes before you put them on to be sure there are no objects inside.  Do not cross your legs. This may decrease the blood flow to your feet.  If you find a minor scrape,  cut, or break in the skin on your feet, keep it and the skin around it clean and dry. These areas may be cleansed with mild soap and water. Do not cleanse the area with peroxide, alcohol, or iodine.  When you remove an adhesive bandage, be sure not to damage the skin around it.  If you have a wound, look at it several times a day to make sure it is healing.  Do not use heating pads or hot water bottles. They may burn your skin. If you have lost feeling in your feet or legs, you may not know it is happening until it is too late.  Make sure your health care provider performs a complete foot exam at least annually or more often if you have foot problems. Report any cuts, sores, or bruises to your health care provider immediately. SEEK MEDICAL CARE IF:   You have an injury that is not healing.  You have cuts or breaks in the skin.  You have an ingrown nail.  You notice redness on your legs or feet.  You feel burning or tingling in your legs or feet.  You have pain or cramps in your legs and feet.  Your legs or feet are numb.  Your feet always feel cold. SEEK IMMEDIATE MEDICAL CARE IF:   There is increasing redness,   swelling, or pain in or around a wound.  There is a red line that goes up your leg.  Pus is coming from a wound.  You develop a fever or as directed by your health care provider.  You notice a bad smell coming from an ulcer or wound.   This information is not intended to replace advice given to you by your health care provider. Make sure you discuss any questions you have with your health care provider.   Document Released: 09/05/2000 Document Revised: 05/11/2013 Document Reviewed: 02/15/2013 Elsevier Interactive Patient Education 2016 Elsevier Inc.  

## 2016-05-14 NOTE — Progress Notes (Signed)
Patient ID: Joseph Mora, male   DOB: 1932/11/16, 82 y.o.   MRN: [id number]    Subjective: Patient presents today with his wife and son present to treatment room complaining of thickened and elongated toenails over the past 5 years which have gradually become more thickened and deformed over time making shoe wearing walking uncomfortable and is requesting toenail debridement  Patient is a diabetic and denies history of foot ulceration, claudication or amputation  Objective: Vascular: Bilateral peripheral pitting edema DP pulses 0/4 bilaterally PT pulses 0/4 bilaterally  Neurological: Sensation to 10 g monofilament wire intact 3/5 right 2/5 left Vibratory sensation nonreactive right reactive left Ankle reflex equal and reactive bilaterally  Dermatological: No open skin lesions bilaterally Dry scaling skin bilaterally The toenails are elongated, brittle, deformed and tender direct palpation 6-10  Musculoskeletal: No deformities noted bilaterally There is no restriction ankle, subtalar, midtarsal joints bilaterally  Assessment: Diabetic peripheral arterial disease Diabetic peripheral neuropathy Neglected symptomatic onychomycoses 6-10  Plan: Today I reviewed the results of examination with patient today. As patient has no open wounds or history of open wounds will continue to monitor patient on at three-month intervals.  The toenails 6-10 were debrided mechanically and electrically without any bleeding   Reappoint 3 months

## 2016-05-22 ENCOUNTER — Ambulatory Visit (INDEPENDENT_AMBULATORY_CARE_PROVIDER_SITE_OTHER): Payer: PPO | Admitting: Ophthalmology

## 2016-05-22 DIAGNOSIS — I10 Essential (primary) hypertension: Secondary | ICD-10-CM | POA: Diagnosis not present

## 2016-05-22 DIAGNOSIS — E11311 Type 2 diabetes mellitus with unspecified diabetic retinopathy with macular edema: Secondary | ICD-10-CM

## 2016-05-22 DIAGNOSIS — H35033 Hypertensive retinopathy, bilateral: Secondary | ICD-10-CM

## 2016-05-22 DIAGNOSIS — H43813 Vitreous degeneration, bilateral: Secondary | ICD-10-CM

## 2016-05-22 DIAGNOSIS — E113593 Type 2 diabetes mellitus with proliferative diabetic retinopathy without macular edema, bilateral: Secondary | ICD-10-CM | POA: Diagnosis not present

## 2016-05-24 ENCOUNTER — Other Ambulatory Visit: Payer: Self-pay | Admitting: Internal Medicine

## 2016-05-28 ENCOUNTER — Encounter: Payer: Self-pay | Admitting: Neurology

## 2016-05-28 ENCOUNTER — Ambulatory Visit (INDEPENDENT_AMBULATORY_CARE_PROVIDER_SITE_OTHER): Payer: PPO | Admitting: Neurology

## 2016-05-28 VITALS — BP 134/71 | HR 77 | Ht 65.0 in | Wt 200.0 lb

## 2016-05-28 DIAGNOSIS — R251 Tremor, unspecified: Secondary | ICD-10-CM | POA: Diagnosis not present

## 2016-05-28 DIAGNOSIS — R413 Other amnesia: Secondary | ICD-10-CM

## 2016-05-28 NOTE — Progress Notes (Signed)
Reason for visit: Memory disturbance  Joseph Mora is an 82 y.o. male  History of present illness:  Joseph Mora is an 82 year old right-handed white male with a history of a memory disturbance and a history of an essential tremor. The patient indicates that he believes that his memory has actually improved since last seen. He denies any further episodes of hypoglycemia, he indicates that he can feel his blood sugar dropping, and treat it immediately. The patient has a gait disorder, but he uses a cane for ambulation, he denies any falls since last seen. The patient indicates that he does operate a motor vehicle without difficulty. He gets some help keeping up with medications and appointments through his wife, and his wife does all the finances. The patient is tolerating Aricept well, he denies any weight loss or diarrhea.  Past Medical History:  Diagnosis Date  . Arthritis    RA  . CAD (coronary artery disease)   . Diabetes mellitus   . Hyperlipidemia   . Memory difficulty 06/22/2015  . Myocardial infarction ([place])   . Tremor 06/22/2015   Intentional, right hand    Past Surgical History:  Procedure Laterality Date  . BACK SURGERY    . CARPAL TUNNEL RELEASE Bilateral   . CATARACT EXTRACTION Bilateral   . INGUINAL HERNIA REPAIR    . PARATHYROIDECTOMY      Family History  Problem Relation Age of Onset  . Heart attack Paternal Aunt   . Stroke Mother   . Diabetes Mother   . Dementia Mother   . Diabetes Sister   . Stroke Sister   . Dementia Sister   . Diabetes Brother   . Dementia Brother   . Hypertension Neg Hx     Social history:  reports that he quit smoking about 5 years ago. His smokeless tobacco use includes Chew. He reports that he does not drink alcohol or use drugs.    Allergies  Allergen Reactions  . Morphine And Related Itching  . Penicillins Hives    Medications:  Prior to Admission medications   Medication Sig Start Date End Date Taking? Authorizing  Provider  aspirin 81 MG tablet Take 81 mg by mouth daily. 10/01/11   Burnell Blanks, MD  atorvastatin (LIPITOR) 40 MG tablet TAKE 1 TABLET BY MOUTH DAILY 07/16/15   Elby Showers, MD  B-D UF III MINI PEN NEEDLES 31G X 5 MM MISC  01/28/16   Historical Provider, MD  Blood Glucose Monitoring Suppl (ONE TOUCH ULTRA SYSTEM KIT) w/Device KIT 1 kit by Does not apply route once. 12/06/15   Elby Showers, MD  donepezil (ARICEPT) 10 MG tablet Take 1 tablet (10 mg total) by mouth at bedtime. 11/22/15   Kathrynn Ducking, MD  doxazosin (CARDURA) 2 MG tablet TAKE 1 TABLET BY MOUTH EVERY NIGHT AT BEDTIME 02/14/16   Elby Showers, MD  glucose blood test strip Use as instructed 12/06/15   Elby Showers, MD  lisinopril (PRINIVIL,ZESTRIL) 20 MG tablet TAKE 1 TABLET BY MOUTH EVERY DAY 05/24/16   Elby Showers, MD  metoprolol (LOPRESSOR) 50 MG tablet TK 1 T PO BID 02/02/16   Historical Provider, MD  metoprolol tartrate (LOPRESSOR) 25 MG tablet Take 25 mg by mouth 2 (two) times daily.    Historical Provider, MD  nitroGLYCERIN (NITROSTAT) 0.4 MG SL tablet Place 1 tablet (0.4 mg total) under the tongue every 5 (five) minutes as needed for chest pain. 03/31/14  Burnell Blanks, MD  NOVOLOG MIX 70/30 FLEXPEN (70-30) 100 UNIT/ML FlexPen 50 units in am, 35 units in pm 11/07/15   Historical Provider, MD  tamsulosin (FLOMAX) 0.4 MG CAPS capsule TAKE 1 CAPSULE BY MOUTH EVERY DAY 03/14/16   Elby Showers, MD  vitamin B-12 100 MCG tablet Take 1 tablet (100 mcg total) by mouth daily. 06/01/15   Florencia Reasons, MD    ROS:  Out of a complete 14 system review of symptoms, the patient complains only of the following symptoms, and all other reviewed systems are negative.  Memory disturbance Tremor  There were no vitals taken for this visit.  Physical Exam  General: The patient is alert and cooperative at the time of the examination.  Skin: No significant peripheral edema is noted.   Neurologic Exam  Mental status: The patient  is alert and oriented x 2 at the time of the examination (not oriented to date). The Mini-Mental Status Examination done today shows a total score of 22/30.   Cranial nerves: Facial symmetry is present. Speech is normal, no aphasia or dysarthria is noted. Extraocular movements are full. Visual fields are full.  Motor: The patient has good strength in all 4 extremities.  Sensory examination: Soft touch sensation is symmetric on the face, arms, and legs.  Coordination: The patient has good finger-nose-finger and heel-to-shin bilaterally.  Gait and station: The patient has a slightly wide-based gait, the patient is able to ambulate independently. Tandem gait was not attempted. Romberg is negative.  Reflexes: Deep tendon reflexes are symmetric.   Assessment/Plan:  1. Memory disorder  2. Essential tremor  3. Mild gait disorder  The patient is doing relatively well at this time. His Mini-Mental status examination does show some slight improvement from last visit. The patient is to remain on Aricept, he will follow-up in 6 months. The tremor is relatively mild, does not impair his ability to perform any activities of daily living.  Jill Alexanders MD 05/28/2016 10:15 AM  [facility] [address] [place], [place] [address]  Phone [phone] Fax [phone]

## 2016-06-09 DIAGNOSIS — I10 Essential (primary) hypertension: Secondary | ICD-10-CM | POA: Diagnosis not present

## 2016-06-09 DIAGNOSIS — E7800 Pure hypercholesterolemia, unspecified: Secondary | ICD-10-CM | POA: Diagnosis not present

## 2016-06-09 DIAGNOSIS — E1165 Type 2 diabetes mellitus with hyperglycemia: Secondary | ICD-10-CM | POA: Diagnosis not present

## 2016-06-17 DIAGNOSIS — E1165 Type 2 diabetes mellitus with hyperglycemia: Secondary | ICD-10-CM | POA: Diagnosis not present

## 2016-07-03 ENCOUNTER — Encounter (INDEPENDENT_AMBULATORY_CARE_PROVIDER_SITE_OTHER): Payer: Self-pay

## 2016-07-03 ENCOUNTER — Ambulatory Visit (INDEPENDENT_AMBULATORY_CARE_PROVIDER_SITE_OTHER): Payer: PPO | Admitting: Cardiovascular Disease

## 2016-07-03 ENCOUNTER — Encounter: Payer: Self-pay | Admitting: Cardiovascular Disease

## 2016-07-03 VITALS — BP 104/58 | HR 74 | Ht 65.0 in | Wt 199.0 lb

## 2016-07-03 DIAGNOSIS — F17201 Nicotine dependence, unspecified, in remission: Secondary | ICD-10-CM | POA: Diagnosis not present

## 2016-07-03 DIAGNOSIS — I2510 Atherosclerotic heart disease of native coronary artery without angina pectoris: Secondary | ICD-10-CM | POA: Diagnosis not present

## 2016-07-03 DIAGNOSIS — M79605 Pain in left leg: Secondary | ICD-10-CM

## 2016-07-03 DIAGNOSIS — M79604 Pain in right leg: Secondary | ICD-10-CM

## 2016-07-03 DIAGNOSIS — I493 Ventricular premature depolarization: Secondary | ICD-10-CM | POA: Diagnosis not present

## 2016-07-03 DIAGNOSIS — E7800 Pure hypercholesterolemia, unspecified: Secondary | ICD-10-CM

## 2016-07-03 NOTE — Patient Instructions (Signed)
Medication Instructions:  Your physician recommends that you continue on your current medications as directed. Please refer to the Current Medication list given to you today.   Labwork: none  Testing/Procedures: Your physician has requested that you have a lower or upper extremity arterial duplex. This test is an ultrasound of the arteries in the legs or arms. It looks at arterial blood flow in the legs and arms. Allow one hour for Lower and Upper Arterial scans. There are no restrictions or special instructions   Follow-Up: Your physician wants you to follow-up in: 12 months.  You will receive a reminder letter in the mail two months in advance. If you don't receive a letter, please call our office to schedule the follow-up appointment.   Any Other Special Instructions Will Be Listed Below (If Applicable).     If you need a refill on your cardiac medications before your next appointment, please call your pharmacy.   

## 2016-07-03 NOTE — Progress Notes (Signed)
 Chief Complaint  Patient presents with  . Leg Pain    History of Present Illness: 82 yo WM with history of CAD, DM, HLD, HTN, DJD here today for cardiac follow up. He has been followed in the past by Dr. Tennant. Cardiac history included stent placement in the Circumflex artery in 1990 and stent placement in the RCA in 2000. Most recent stress test in July 2014 with normal LV function, no obvious ischemia. He was admitted to the hospital in September 2016 after being bitten by many fire ants resulting in acute encephalopathy, angioedema and acute kidney injury. He was noted to have PVCs during this hospitalization and cardiology consultation recommended starting Lopressor.   He is here today for follow up. No chest pain or SOB. No dizziness, near syncope or syncope. He has chronic hip and back pain which limits his walking. He is now having bilateral leg pain   Primary Care Physician: BAXLEY,MARY J, MD  Past Medical History:  Diagnosis Date  . Arthritis    RA  . CAD (coronary artery disease)   . Diabetes mellitus   . Hyperlipidemia   . Memory difficulty 06/22/2015  . Myocardial infarction   . Tremor 06/22/2015   Intentional, right hand    Past Surgical History:  Procedure Laterality Date  . BACK SURGERY    . CARPAL TUNNEL RELEASE Bilateral   . CATARACT EXTRACTION Bilateral   . INGUINAL HERNIA REPAIR    . PARATHYROIDECTOMY      Current Outpatient Prescriptions  Medication Sig Dispense Refill  . aspirin 81 MG tablet Take 81 mg by mouth daily.    . atorvastatin (LIPITOR) 40 MG tablet TAKE 1 TABLET BY MOUTH DAILY 30 tablet 11  . B-D UF III MINI PEN NEEDLES 31G X 5 MM MISC     . Blood Glucose Monitoring Suppl (ONE TOUCH ULTRA SYSTEM KIT) w/Device KIT 1 kit by Does not apply route once. 1 each 0  . donepezil (ARICEPT) 10 MG tablet Take 1 tablet (10 mg total) by mouth at bedtime. 90 tablet 3  . doxazosin (CARDURA) 2 MG tablet TAKE 1 TABLET BY MOUTH EVERY NIGHT AT BEDTIME 30 tablet  11  . glucose blood test strip Use as instructed 100 each 12  . insulin NPH-regular Human (NOVOLIN 70/30) (70-30) 100 UNIT/ML injection Inject into the skin as directed.    . lisinopril (PRINIVIL,ZESTRIL) 20 MG tablet TAKE 1 TABLET BY MOUTH EVERY DAY 30 tablet 11  . metoprolol (LOPRESSOR) 50 MG tablet TK 1 T PO BID  1  . nitroGLYCERIN (NITROSTAT) 0.4 MG SL tablet Place 1 tablet (0.4 mg total) under the tongue every 5 (five) minutes as needed for chest pain. 25 tablet 6  . tamsulosin (FLOMAX) 0.4 MG CAPS capsule TAKE 1 CAPSULE BY MOUTH EVERY DAY 30 capsule 11  . vitamin B-12 100 MCG tablet Take 1 tablet (100 mcg total) by mouth daily. 30 tablet 0   No current facility-administered medications for this visit.     Allergies  Allergen Reactions  . Morphine And Related Itching  . Penicillins Hives    Social History   Social History  . Marital status: Married    Spouse name: N/A  . Number of children: 4  . Years of education: GED   Occupational History  . retired    Social History Main Topics  . Smoking status: Former Smoker    Quit date: 08/03/2010  . Smokeless tobacco: Current User    Types: Chew  .   Alcohol use No  . Drug use: No  . Sexual activity: Not on file   Other Topics Concern  . Not on file   Social History Narrative   Patient drinks 3 cups of caffeine daily.   Patient is right handed.     Family History  Problem Relation Age of Onset  . Heart attack Paternal Aunt   . Stroke Mother   . Diabetes Mother   . Dementia Mother   . Diabetes Sister   . Stroke Sister   . Dementia Sister   . Diabetes Brother   . Dementia Brother   . Hypertension Neg Hx     Review of Systems:  As stated in the HPI and otherwise negative.   BP (!) 104/58   Pulse 74   Ht 5' 5" (1.651 m)   Wt 199 lb (90.3 kg)   BMI 33.12 kg/m   Physical Examination: General: Well developed, well nourished, NAD  HEENT: OP clear, mucus membranes moist  SKIN: warm, dry. No rashes. Neuro: No  focal deficits  Musculoskeletal: Muscle strength 5/5 all ext  Psychiatric: Mood and affect normal  Neck: No JVD, no carotid bruits, no thyromegaly, no lymphadenopathy.  Lungs:Clear bilaterally, no wheezes, rhonci, crackles Cardiovascular: Regular rate and rhythm. No murmurs, gallops or rubs. Abdomen:Soft. Bowel sounds present. Non-tender.  Extremities: No lower extremity edema. Pulses are not palpable in the bilateral DP/PT.  EKG:  EKG is ordered today. The ekg ordered today demonstrates Sinus rhythm rate 74 bpm. 1st degree AV block. PVCs. RBBB  Recent Labs: 10/14/2015: ALT 18 10/15/2015: BUN 8; Creatinine, Ser 1.11; Hemoglobin 16.3; Platelets 101; Potassium 3.9; Sodium 142   Lipid Panel    Component Value Date/Time   CHOL 118 (L) 05/17/2015 0906   TRIG 61 05/17/2015 0906   HDL 41 05/17/2015 0906   CHOLHDL 2.9 05/17/2015 0906   VLDL 12 05/17/2015 0906   LDLCALC 65 05/17/2015 0906     Wt Readings from Last 3 Encounters:  07/03/16 199 lb (90.3 kg)  05/28/16 200 lb (90.7 kg)  01/29/16 191 lb 8 oz (86.9 kg)     Other studies Reviewed: Additional studies/ records that were reviewed today include: . Review of the above records demonstrates:    Assessment and Plan:   1. CAD:  Stable. He is having no chest pain suggestive of angina. Will continue current therapy with ASA, statin, beta blocker and Ace-inh.  2. HLD: lipids well controlled on statin. No changes. He will have lipids repeated in primary care. He is not fasting today.   3. Tobacco abuse, in remission: He stopped smoking 2010.   4. PVCs: He is not aware of palpitations. Continue Lopressor  5. Leg pain: He has leg pain and weakness with walking c/w claudication, bilaterally. Will arrange LE arterial dopplers to exclude obstructive disease. He also has diabetes and this could be neuropathy.   Current medicines are reviewed at length with the patient today.  The patient does not have concerns regarding medicines.  The  following changes have been made:  no change  Labs/ tests ordered today include:   Orders Placed This Encounter  Procedures  . EKG 12-Lead    Disposition:   FU with me in 12  months  Signed, Lauree Chandler, MD 07/03/2016 11:34 AM    [facility] [place], [place], [place]  [address] Phone: [phone]; Fax: [phone]

## 2016-07-04 ENCOUNTER — Other Ambulatory Visit: Payer: Self-pay | Admitting: Cardiovascular Disease

## 2016-07-04 DIAGNOSIS — I739 Peripheral vascular disease, unspecified: Secondary | ICD-10-CM

## 2016-07-07 IMAGING — MR MR HEAD W/O CM
9 of 10 series · 35 of 48 positions shown · non-contrast
Comparison: Head CT without contrast 05/29/2015.

CLINICAL DATA: 82-year-old male with increased confusion, acute
functional decline. Acute tongue swelling, pruritus. Initial
encounter.

EXAM:
MRI HEAD WITHOUT CONTRAST
TECHNIQUE: Multiplanar, multiecho pulse sequences of the brain and surrounding
structures were obtained without intravenous contrast.

[Series 2: FLAIR · sagittal · 5.0mm · 0.47mm/px · 3 of 25 slices shown (1 of 2)]
[im 1/25]
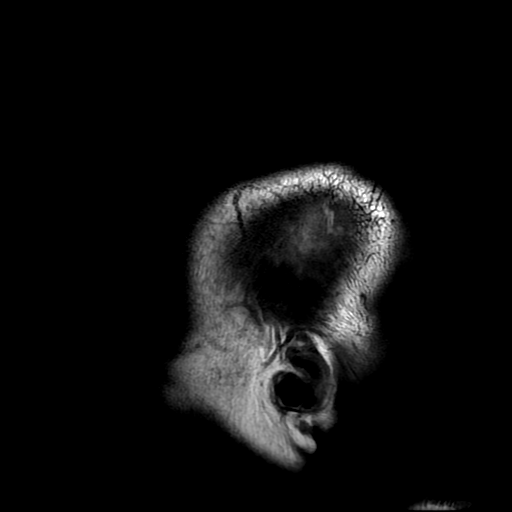
[im 13/25]
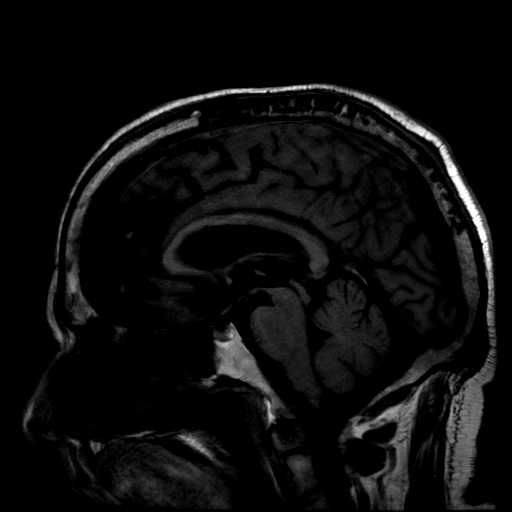
[im 25/25]
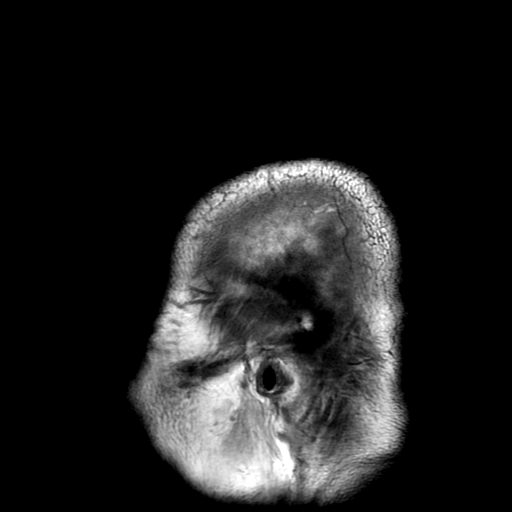

[Series 4: DWI · axial · 3.6mm · 0.94mm/px · z∈[-92,+58]mm · 9 of 86 slices shown (1 of 4)]
[im 1/86]
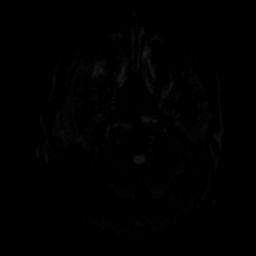
[im 11/86]
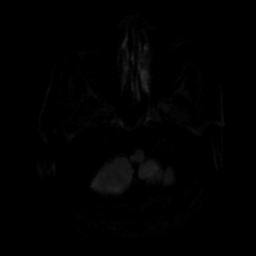
[im 22/86]
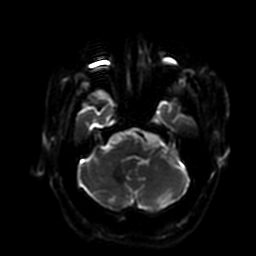
[im 32/86]
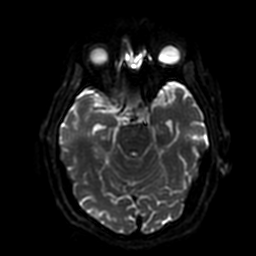
[im 43/86]
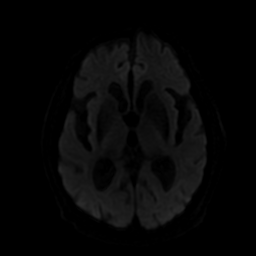
[im 54/86]
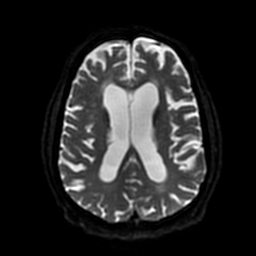
[im 64/86]
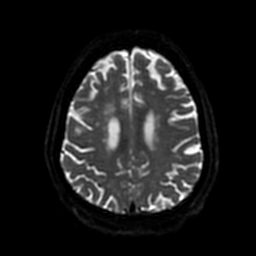
[im 75/86]
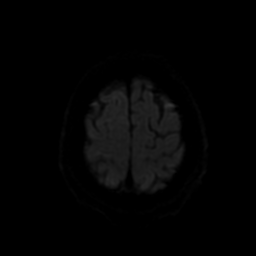
[im 86/86]
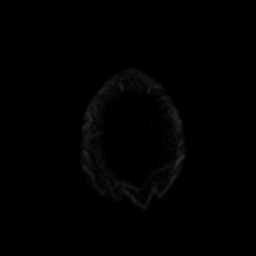

[Series 5: DWI · coronal · 5.0mm · 0.94mm/px · 7 of 72 slices shown (2 of 4)]
[im 1/72]
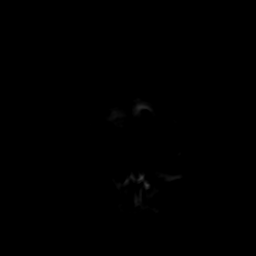
[im 12/72]
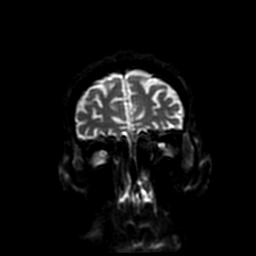
[im 24/72]
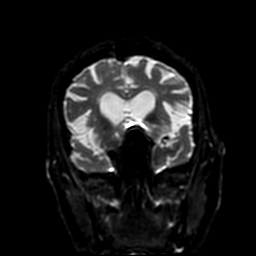
[im 36/72]
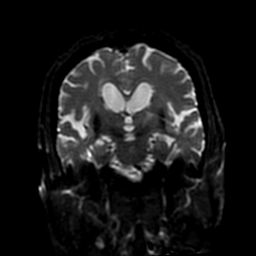
[im 48/72]
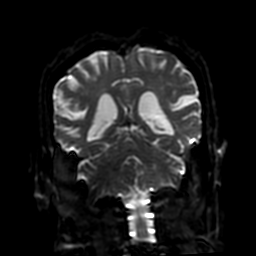
[im 60/72]
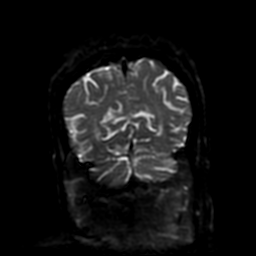
[im 72/72]
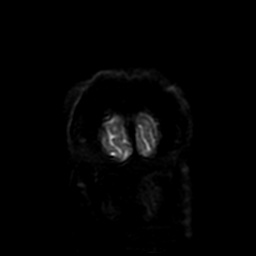

[Series 6: T2 · axial · 5.0mm · 0.47mm/px · z∈[-92,+58]mm · 2 of 26 slices shown (1 of 2)]
[im 1/26]
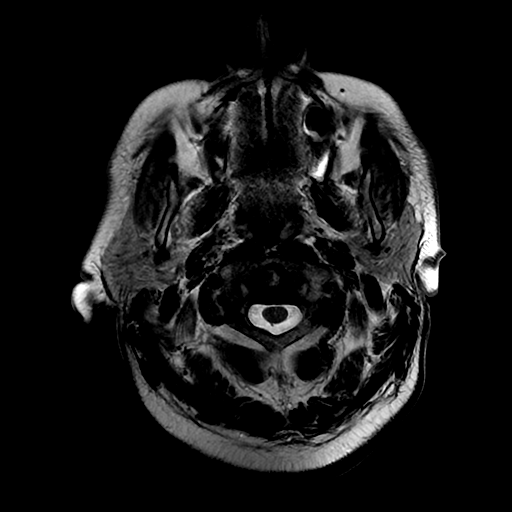
[im 26/26]
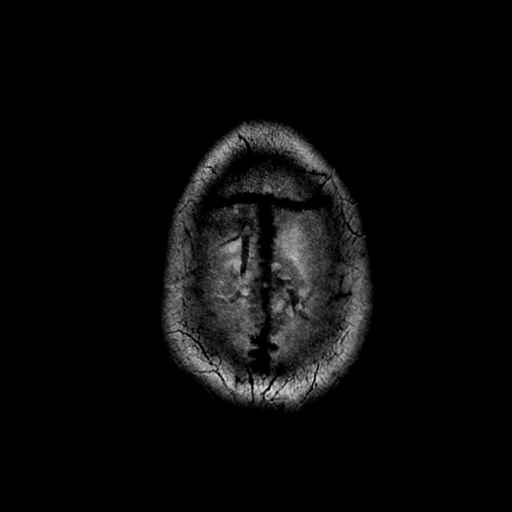

[Series 7: FLAIR · axial · 5.0mm · 0.47mm/px · z∈[-92,+58]mm · 2 of 26 slices shown (2 of 2)]
[im 1/26]
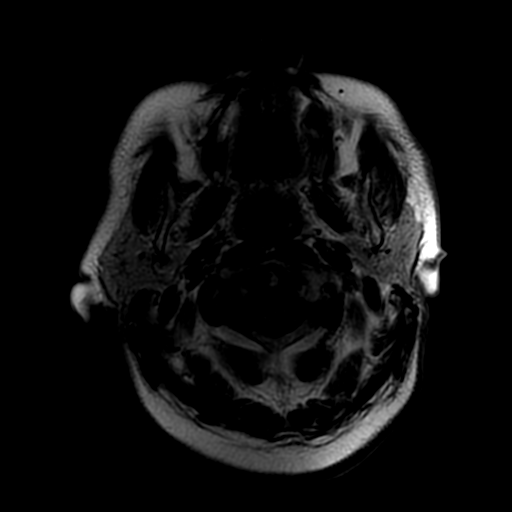
[im 26/26]
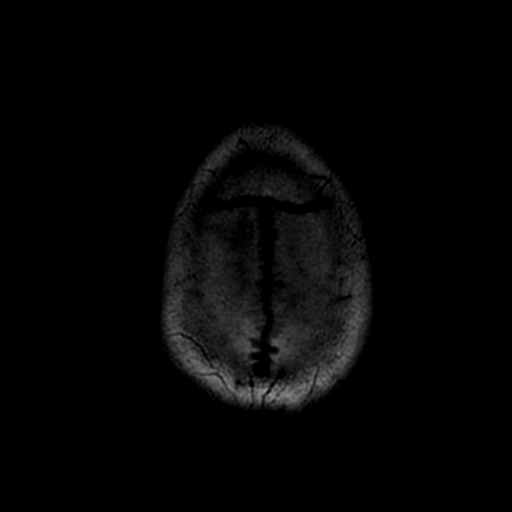

[Series 8: (person_name) · axial · 3.0mm · 0.47mm/px · z∈[-93,-77]mm · 2 of 104 slices shown]
[im 1/104]
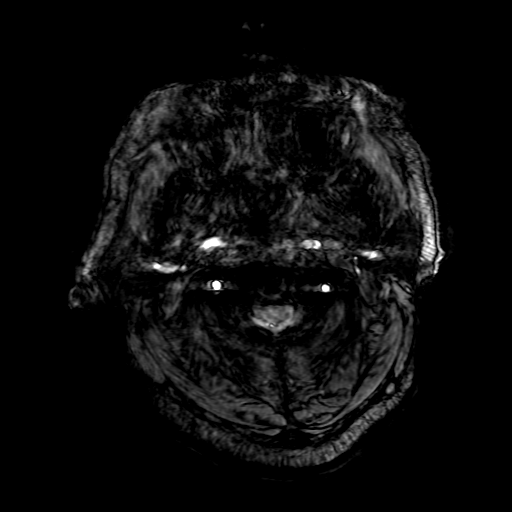
[im 12/104]
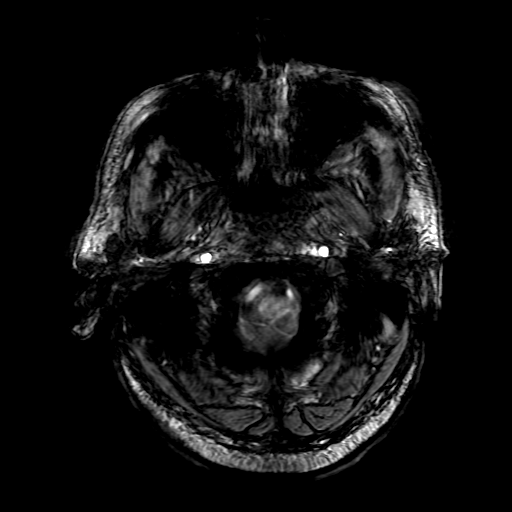

[Series 9: T2 · coronal · 5.0mm · 0.39mm/px · 3 of 30 slices shown (2 of 2)]
[im 1/30]
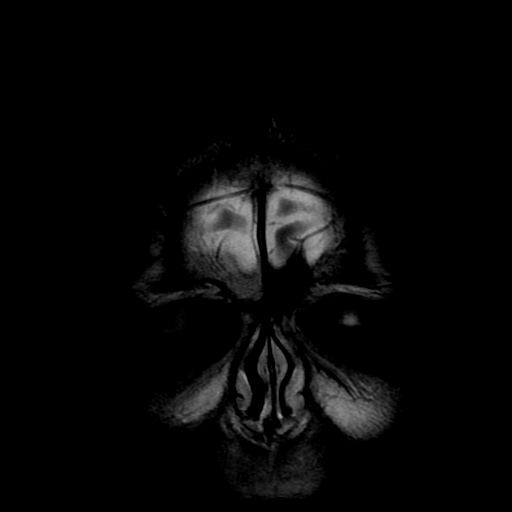
[im 15/30]
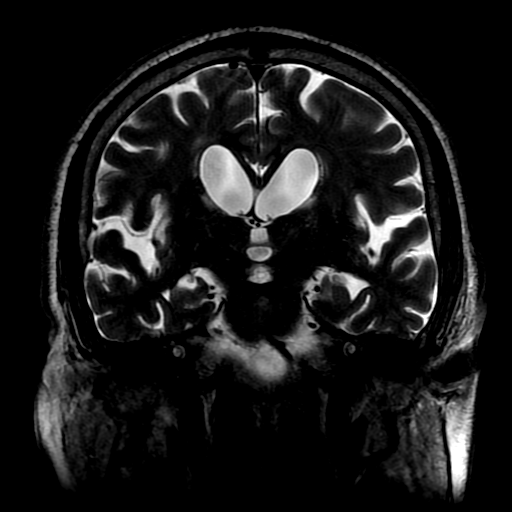
[im 30/30]
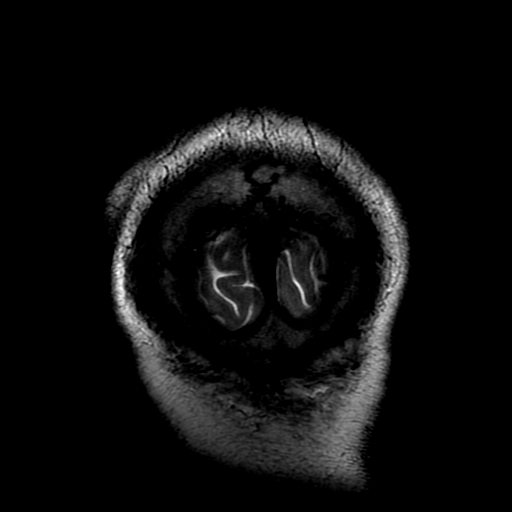

[Series 400: DWI · axial · 3.6mm · 0.94mm/px · z∈[-92,+58]mm · 4 of 43 slices shown (3 of 4)]
[im 1/43]
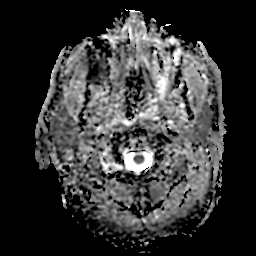
[im 15/43]
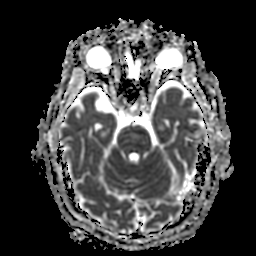
[im 29/43]
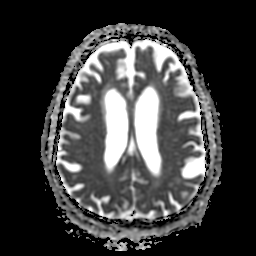
[im 43/43]
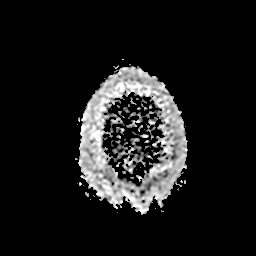

[Series 500: DWI · coronal · 5.0mm · 0.94mm/px · 3 of 36 slices shown (4 of 4)]
[im 1/36]
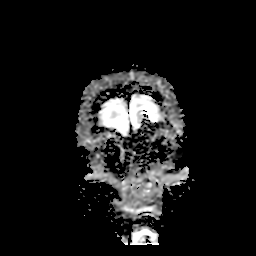
[im 18/36]
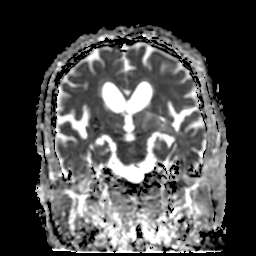
[im 36/36]
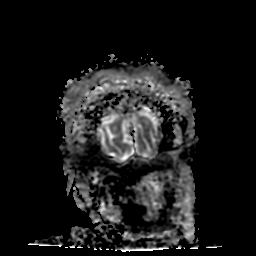

[35 of 48 positions shown; findings below may reference images not displayed]

FINDINGS: Cerebral volume is within normal limits for age. No restricted
diffusion to suggest acute infarction. No midline shift, mass
effect, evidence of mass lesion, ventriculomegaly, extra-axial
collection or acute intracranial hemorrhage. Cervicomedullary
junction and pituitary are within normal limits. Negative visualized
cervical spine. Major intracranial vascular flow voids are
preserved. There is mild ectasia at the basilar artery tip. There
may be azygos type proximal ACA anatomy.

Mild for age scattered nonspecific cerebral white matter T2 and
FLAIR hyperintensity. No cortical encephalomalacia or chronic
cerebral blood products are identified. Mild to moderate T2
heterogeneity in the deep gray matter nuclei and pons. T2
heterogeneity also in the left cerebellar peduncle. Cerebellum
otherwise within normal limits for age.

Visible internal auditory structures appear normal. Trace paranasal
sinus and mastoid fluid. Postoperative changes to both globes,
otherwise negative orbit and scalp soft tissues. Normal bone marrow
signal.
IMPRESSION: 1.  No acute intracranial abnormality.
2. Mild to moderate for age nonspecific signal changes, most
commonly due to chronic small vessel disease.

## 2016-07-09 ENCOUNTER — Ambulatory Visit (HOSPITAL_COMMUNITY)
Admission: RE | Admit: 2016-07-09 | Discharge: 2016-07-09 | Disposition: A | Discharge status: Home / Self Care | Payer: PPO | Source: Ambulatory Visit | Attending: Cardiology | Admitting: Cardiology

## 2016-07-09 DIAGNOSIS — M79604 Pain in right leg: Secondary | ICD-10-CM

## 2016-07-09 DIAGNOSIS — I739 Peripheral vascular disease, unspecified: Secondary | ICD-10-CM | POA: Diagnosis not present

## 2016-07-09 DIAGNOSIS — R9439 Abnormal result of other cardiovascular function study: Secondary | ICD-10-CM | POA: Insufficient documentation

## 2016-07-09 DIAGNOSIS — M79605 Pain in left leg: Secondary | ICD-10-CM

## 2016-07-16 ENCOUNTER — Other Ambulatory Visit: Payer: Self-pay | Admitting: Internal Medicine

## 2016-07-29 ENCOUNTER — Other Ambulatory Visit: Payer: PPO | Admitting: Internal Medicine

## 2016-07-29 DIAGNOSIS — IMO0001 Reserved for inherently not codable concepts without codable children: Secondary | ICD-10-CM

## 2016-07-29 DIAGNOSIS — N401 Enlarged prostate with lower urinary tract symptoms: Secondary | ICD-10-CM

## 2016-07-29 DIAGNOSIS — E119 Type 2 diabetes mellitus without complications: Secondary | ICD-10-CM | POA: Diagnosis not present

## 2016-07-29 DIAGNOSIS — I10 Essential (primary) hypertension: Secondary | ICD-10-CM | POA: Diagnosis not present

## 2016-07-29 DIAGNOSIS — Z794 Long term (current) use of insulin: Secondary | ICD-10-CM

## 2016-07-29 LAB — CBC WITH DIFFERENTIAL/PLATELET
BASOS PCT: 0 %
Basophils Absolute: 0 cells/uL (ref 0–200)
EOS ABS: 172 {cells}/uL (ref 15–500)
Eosinophils Relative: 2 %
HEMATOCRIT: 46.1 % (ref 38.5–50.0)
HEMOGLOBIN: 15.3 g/dL (ref 13.2–17.1)
LYMPHS ABS: 2064 {cells}/uL (ref 850–3900)
LYMPHS PCT: 24 %
MCH: 31.8 pg (ref 27.0–33.0)
MCHC: 33.2 g/dL (ref 32.0–36.0)
MCV: 95.8 fL (ref 80.0–100.0)
MONO ABS: 688 {cells}/uL (ref 200–950)
MPV: 10.1 fL (ref 7.5–12.5)
Monocytes Relative: 8 %
NEUTROS PCT: 66 %
Neutro Abs: 5676 cells/uL (ref 1500–7800)
Platelets: 112 10*3/uL — ABNORMAL LOW (ref 140–400)
RBC: 4.81 MIL/uL (ref 4.20–5.80)
RDW: 13.8 % (ref 11.0–15.0)
WBC: 8.6 10*3/uL (ref 3.8–10.8)

## 2016-07-29 LAB — COMPREHENSIVE METABOLIC PANEL
ALBUMIN: 3.7 g/dL (ref 3.6–5.1)
ALK PHOS: 69 U/L (ref 40–115)
ALT: 17 U/L (ref 9–46)
AST: 21 U/L (ref 10–35)
BILIRUBIN TOTAL: 0.6 mg/dL (ref 0.2–1.2)
BUN: 15 mg/dL (ref 7–25)
CALCIUM: 8.8 mg/dL (ref 8.6–10.3)
CO2: 26 mmol/L (ref 20–31)
Chloride: 104 mmol/L (ref 98–110)
Creat: 1.26 mg/dL — ABNORMAL HIGH (ref 0.70–1.11)
GLUCOSE: 40 mg/dL — AB (ref 65–99)
POTASSIUM: 3.5 mmol/L (ref 3.5–5.3)
Sodium: 141 mmol/L (ref 135–146)
Total Protein: 6.2 g/dL (ref 6.1–8.1)

## 2016-07-29 LAB — LIPID PANEL
CHOL/HDL RATIO: 2.8 ratio (ref <5.0)
Cholesterol: 121 mg/dL (ref <200)
HDL: 44 mg/dL (ref >40)
LDL Cholesterol: 63 mg/dL
TRIGLYCERIDES: 68 mg/dL (ref <150)
VLDL: 14 mg/dL (ref <30)

## 2016-07-29 LAB — PSA: PSA: 1.2 ng/mL (ref <=4.0)

## 2016-07-29 NOTE — Addendum Note (Signed)
Addended by: Westley Hummer B on: 07/29/2016 11:09 AM   Modules accepted: Orders

## 2016-07-29 NOTE — Addendum Note (Signed)
Addended by: Westley Hummer B on: 07/29/2016 09:08 AM   Modules accepted: Orders

## 2016-07-30 ENCOUNTER — Telehealth: Payer: Self-pay | Admitting: Internal Medicine

## 2016-07-30 LAB — MICROALBUMIN / CREATININE URINE RATIO
Creatinine, Urine: 110 mg/dL (ref 20–370)
Microalb Creat Ratio: 3 mcg/mg creat (ref <30)
Microalb, Ur: 0.3 mg/dL

## 2016-07-30 LAB — HEMOGLOBIN A1C
HEMOGLOBIN A1C: 7 % — AB (ref <5.7)
Mean Plasma Glucose: 154 mg/dL

## 2016-07-30 NOTE — Telephone Encounter (Signed)
Lab calling to state that patient has a critical low value on his glucose:  They have repeated the lab and it is still 58.  Advised I would share this with you.

## 2016-07-30 NOTE — Telephone Encounter (Signed)
Called wife this morning. He took insulin and did not eat before coming to office to have lab work drawn yesterday. Serum glucose was 40. Wife was not aware he was confused. Next time he is not to take insulin before coming to office to have blood drawn. He'll come fasting, have his blood drawn and returnhome to take insulin and eat.

## 2016-07-31 ENCOUNTER — Ambulatory Visit (INDEPENDENT_AMBULATORY_CARE_PROVIDER_SITE_OTHER): Payer: PPO | Admitting: Internal Medicine

## 2016-07-31 VITALS — BP 104/66 | HR 67 | Temp 96.5°F | Ht 65.0 in | Wt 195.0 lb

## 2016-07-31 DIAGNOSIS — G250 Essential tremor: Secondary | ICD-10-CM

## 2016-07-31 DIAGNOSIS — M545 Low back pain: Secondary | ICD-10-CM | POA: Diagnosis not present

## 2016-07-31 DIAGNOSIS — E119 Type 2 diabetes mellitus without complications: Secondary | ICD-10-CM

## 2016-07-31 DIAGNOSIS — R351 Nocturia: Secondary | ICD-10-CM

## 2016-07-31 DIAGNOSIS — N401 Enlarged prostate with lower urinary tract symptoms: Secondary | ICD-10-CM

## 2016-07-31 DIAGNOSIS — E7800 Pure hypercholesterolemia, unspecified: Secondary | ICD-10-CM

## 2016-07-31 DIAGNOSIS — Z9889 Other specified postprocedural states: Secondary | ICD-10-CM

## 2016-07-31 DIAGNOSIS — Z794 Long term (current) use of insulin: Secondary | ICD-10-CM

## 2016-07-31 DIAGNOSIS — H903 Sensorineural hearing loss, bilateral: Secondary | ICD-10-CM | POA: Diagnosis not present

## 2016-07-31 DIAGNOSIS — Z0000 Encounter for general adult medical examination without abnormal findings: Secondary | ICD-10-CM | POA: Diagnosis not present

## 2016-07-31 DIAGNOSIS — R413 Other amnesia: Secondary | ICD-10-CM

## 2016-07-31 DIAGNOSIS — I519 Heart disease, unspecified: Secondary | ICD-10-CM

## 2016-07-31 DIAGNOSIS — R269 Unspecified abnormalities of gait and mobility: Secondary | ICD-10-CM | POA: Diagnosis not present

## 2016-07-31 DIAGNOSIS — Z8639 Personal history of other endocrine, nutritional and metabolic disease: Secondary | ICD-10-CM

## 2016-07-31 DIAGNOSIS — I10 Essential (primary) hypertension: Secondary | ICD-10-CM

## 2016-07-31 DIAGNOSIS — G8929 Other chronic pain: Secondary | ICD-10-CM

## 2016-07-31 DIAGNOSIS — IMO0001 Reserved for inherently not codable concepts without codable children: Secondary | ICD-10-CM

## 2016-07-31 MED ORDER — NITROGLYCERIN 0.4 MG SL SUBL: 0.4000 mg | SUBLINGUAL_TABLET | SUBLINGUAL | 6 refills | Status: DC | PRN

## 2016-07-31 NOTE — Progress Notes (Signed)
Subjective:    Patient ID: Joseph Mora, male    DOB: 21-Jul-1933, 82 y.o.   MRN: [id number]  HPI   83 year old Male for Medicare Wellness and physical examination.History of insulin-dependent diabetes mellitus, memory loss, hypertension, hyperlipidemia, coronary disease, His glucose control has improved considerably from one year ago. Says Accu-Chek this morning was 158. Sometimes gets dizzy for a few minutes but sits down and gets better. His lipid panel and PSA are normal. K is 3.5. Not on diuretic therapy.  Has a history of adenomatous colon polyps, BPH, chronic back pain. Has nocturia.  In September 1990 he had PTCA with a 100% occluded circumflex artery. He had a cardiac catheterization with stent placement to the right coronary artery September 2000. Last stress test was in 2014 and was negative.  Had negative ABI bilaterally October 2017. In September 2016 he had a 2-D echocardiogram showing normal left atrial size. No significant valvular issues. He had grade 1 diastolic dysfunction. Left medical size was normal. Ejection fraction estimated 60-65%. Had trileaflet aortic valve. Also had Doppler studies October 18 of the lower extremities negative for DVT.  History of screening colonoscopy by Dr. Collene Mares in 2004.  Right inguinal hernia repair 1953, lumbar disc surgery L4-L5 and L5-S1 1998. Cataract extraction of right eye in 2006. Surgery for chronic left ulnar neuropathy due to diabetes and a left carpal tunnel release done by Dr. Daylene Katayama March 2012.  Surgery for left the. Parathyroid adenoma September 2011.  In 1999 when he was hospitalized for back surgery, he developed a GI bleed requiring emergency surgery with a gastroduodenostomy and pyloroplasty. He also had a truncal vagotomy. A large bleeding duodenal ulcer was detected and that is when H. pylori was diagnosed.  He quit smoking in 1990. Does not consume alcohol.  Doesn't walk as much as he used to because of musculoskeletal  pain.  Social history: He is married. He has adult children and has raised a number of foster children over the years. 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  Family history: Father died at age 27 struck by motor vehicle. Mother died at age 71 with history of diabetes, stroke and Alzheimer's disease. One brother died at age 7 with history of colon cancer, alcoholism and myocardial infarction. 7 sisters and 7 brothers living. One brother with adult onset diabetes. 3 sisters with diabetes mellitus.  Was admitted to the hospital in September 2016 after being bitten by fire ants resulting in acute encephalopathy, angioedema and acute kidney injury. He had PVCs during that hospitalization and cardiologist was called to see him. Lopressor was recommended.  He sees Dr. Jannifer Franklin for memory disorder and essential tremor. He is on Aricept. Diagnosed with essential tremor, memory disorder and mild gait disorder. Tremor is relatively mild and does not impair his ability to perform activities of daily living.  Episode of profound hypoglycemia requiring admission January 2017.  History of CMV infection 1997 which she initially thought was a reaction to Pneumovax immunization.  History of H. pylori gastritis March 1999   Family history: Sister died of stroke recently at age 33.  No longer driving.      Review of Systems  Respiratory: Negative.   Cardiovascular: Negative.   Gastrointestinal: Negative.   Genitourinary:       Nocturia  Musculoskeletal:       Back and leg pain  Neurological:       Memory loss and tremor  Psychiatric/Behavioral: Negative.  Objective:   Physical Exam  Constitutional: He appears well-developed and well-nourished. No distress.  HENT:  Head: Normocephalic and atraumatic.  Right Ear: External ear normal.  Left Ear: External ear normal.  Mouth/Throat: Oropharynx is clear and moist. No oropharyngeal exudate.  Eyes: Conjunctivae and  EOM are normal. Pupils are equal, round, and reactive to light. Right eye exhibits no discharge. Left eye exhibits no discharge. No scleral icterus.  Neck: Neck supple. No JVD present. No thyromegaly present.  Cardiovascular: Normal rate, regular rhythm and normal heart sounds.   No murmur heard. Pulmonary/Chest: Effort normal and breath sounds normal. He has no wheezes.  Abdominal: Soft. Bowel sounds are normal. He exhibits no distension and no mass. There is no tenderness. There is no rebound and no guarding.  Genitourinary: Prostate normal.  Musculoskeletal: He exhibits no edema.  Lymphadenopathy:    He has no cervical adenopathy.  Neurological:  Knows year, day of week, president of [place], year.  Skin: Skin is warm and dry. He is not diaphoretic.  Psychiatric: He has a normal mood and affect. His behavior is normal. Judgment and thought content normal.  Vitals reviewed.         Assessment & Plan:  Memory loss followed by a neurologist  Tremor-followed by a neurologist  Insulin-dependent diabetes mellitus-episode of profound hypoglycemia January 27 2  Hyperlipidemia-lipid panel liver functions normal  BPH  Chronic back pain  Coronary disease followed by cardiologist  History of adenomatous colon polyps  Hypertension  Status post surgery for left parathyroid adenoma 2011  History of vagotomy and pyloroplasty due to gastric ulcer  Plan: Continue same medications and return in 6 months. He does not appear to be quite as agile as he used to be and his memory is failing a bit    Subjective:   Patient presents for Medicare Annual/Subsequent preventive examination.  Review Past Medical/Family/Social:   Risk Factors  Current exercise habits:  Dietary issues discussed:   Cardiac risk factors:  Depression Screen  (Note: if answer to either of the following is "Yes", a more complete depression screening is indicated)   Over the past two weeks, have you felt down,  depressed or hopeless? No  Over the past two weeks, have you felt little interest or pleasure in doing things? No Have you lost interest or pleasure in daily life? No Do you often feel hopeless? No Do you cry easily over simple problems? No   Activities of Daily Living  In your present state of health, do you have any difficulty performing the following activities?: Completed by his wife  Driving? No longer drives Managing money? yes due to memory issues Feeding yourself? No  Getting from bed to chair? yes Climbing a flight of stairs? yes due to orthopedic issues Preparing food and eating?: No  Bathing or showering? No  Getting dressed: No  Getting to the toilet? No  Using the toilet:No  Moving around from place to place: He is due to orthopedic issues In the past year have you fallen or had a near fall?:No  Are you sexually active? No  Do you have more than one partner? No   Hearing Difficulties: No  Do you often ask people to speak up or repeat themselves? yes Do you experience ringing or noises in your ears? No  Do you have difficulty understanding soft or whispered voices? yes Do you feel that you have a problem with memory? yes Do you often misplace items? yes   Home Safety:  Do you have a smoke alarm at your residence? Yes Do you have grab bars in the bathroom? No Do you have throw rugs in your house? No   Cognitive Testing  Alert? Yes Normal Appearance?Yes  Oriented to person? Yes Place? Yes  Time? Yes  Recall of three objects? Not tested Can perform simple calculations? Yes  Displays appropriate judgment?Yes  Can read the correct time from a watch face?Yes   List the Names of Other Physician/Practitioners you currently use:  See referral list for the physicians patient is currently seeing.  Cardiologist  Neurologist   Review of Systems: See above   Objective:     General appearance: Appears stated age  Head: Normocephalic, without obvious  abnormality, atraumatic  Eyes: conj clear, EOMi PEERLA  Ears: normal TM's and external ear canals both ears  Nose: Nares normal. Septum midline. Mucosa normal. No drainage or sinus tenderness.  Throat: lips, mucosa, and tongue normal; teeth and gums normal  Neck: no adenopathy, no carotid bruit, no JVD, supple, symmetrical, trachea midline and thyroid not enlarged, symmetric, no tenderness/mass/nodules  No CVA tenderness.  Lungs: clear to auscultation bilaterally  Breasts: normal appearance, no masses or tenderness, t Heart: regular rate and rhythm, S1, S2 normal, no murmur, click, rub or gallop  Abdomen: soft, non-tender; bowel sounds normal; no masses, no organomegaly  Musculoskeletal: ROM normal in all joints, no crepitus, no deformity, Normal muscle strengthen. Back  is symmetric, no curvature. Skin: Skin color, texture, turgor normal. No rashes or lesions  Lymph nodes: Cervical, supraclavicular, and axillary nodes normal.  Neurologic: CN 2 -12 Normal, Normal symmetric reflexes. Normal coordination and gait  Psych: Alert & Oriented x 3, Mood appear stable.    Assessment:    Annual wellness medicare exam   Plan:    During the course of the visit the patient was educated and counseled about appropriate screening and preventive services including:   Annual PSA       Patient Instructions (the written plan) was given to the patient.  Medicare Attestation  I have personally reviewed:  The patient's medical and social history  Their use of alcohol, tobacco or illicit drugs  Their current medications and supplements  The patient's functional ability including ADLs,fall risks, home safety risks, cognitive, and hearing and visual impairment  Diet and physical activities  Evidence for depression or mood disorders  The patient's weight, height, BMI, and visual acuity have been recorded in the chart. I have made referrals, counseling, and provided education to the patient based on  review of the above and I have provided the patient with a written personalized care plan for preventive services.     Marland Kitchen

## 2016-08-20 ENCOUNTER — Encounter: Payer: Self-pay | Admitting: Podiatry

## 2016-08-20 ENCOUNTER — Encounter: Payer: Self-pay | Admitting: Internal Medicine

## 2016-08-20 ENCOUNTER — Ambulatory Visit (INDEPENDENT_AMBULATORY_CARE_PROVIDER_SITE_OTHER): Payer: PPO | Admitting: Podiatry

## 2016-08-20 VITALS — BP 125/65 | HR 69 | Resp 18

## 2016-08-20 DIAGNOSIS — M79674 Pain in right toe(s): Secondary | ICD-10-CM

## 2016-08-20 DIAGNOSIS — M79675 Pain in left toe(s): Secondary | ICD-10-CM

## 2016-08-20 DIAGNOSIS — B351 Tinea unguium: Secondary | ICD-10-CM | POA: Diagnosis not present

## 2016-08-20 NOTE — Patient Instructions (Signed)
It was pleasure to see you today. Continue same medications and return in 6 months. 

## 2016-08-20 NOTE — Patient Instructions (Signed)
Removed with a Band-Aid on the third right toe and 1-3 days and apply topical antibiotic ointment and a Band-Aid daily until a scab forms. Contact the office or emergency department if you notice any sudden increase in pain, swelling, redness, fever in the third right toe or right foot  Diabetes and Foot Care Diabetes may cause you to have problems because of poor blood supply (circulation) to your feet and legs. This may cause the skin on your feet to become thinner, break easier, and heal more slowly. Your skin may become dry, and the skin may peel and crack. You may also have nerve damage in your legs and feet causing decreased feeling in them. You may not notice minor injuries to your feet that could lead to infections or more serious problems. Taking care of your feet is one of the most important things you can do for yourself. Follow these instructions at home:  Wear shoes at all times, even in the house. Do not go barefoot. Bare feet are easily injured.  Check your feet daily for blisters, cuts, and redness. If you cannot see the bottom of your feet, use a mirror or ask someone for help.  Wash your feet with warm water (do not use hot water) and mild soap. Then pat your feet and the areas between your toes until they are completely dry. Do not soak your feet as this can dry your skin.  Apply a moisturizing lotion or petroleum jelly (that does not contain alcohol and is unscented) to the skin on your feet and to dry, brittle toenails. Do not apply lotion between your toes.  Trim your toenails straight across. Do not dig under them or around the cuticle. File the edges of your nails with an emery board or nail file.  Do not cut corns or calluses or try to remove them with medicine.  Wear clean socks or stockings every day. Make sure they are not too tight. Do not wear knee-high stockings since they may decrease blood flow to your legs.  Wear shoes that fit properly and have enough  cushioning. To break in new shoes, wear them for just a few hours a day. This prevents you from injuring your feet. Always look in your shoes before you put them on to be sure there are no objects inside.  Do not cross your legs. This may decrease the blood flow to your feet.  If you find a minor scrape, cut, or break in the skin on your feet, keep it and the skin around it clean and dry. These areas may be cleansed with mild soap and water. Do not cleanse the area with peroxide, alcohol, or iodine.  When you remove an adhesive bandage, be sure not to damage the skin around it.  If you have a wound, look at it several times a day to make sure it is healing.  Do not use heating pads or hot water bottles. They may burn your skin. If you have lost feeling in your feet or legs, you may not know it is happening until it is too late.  Make sure your health care provider performs a complete foot exam at least annually or more often if you have foot problems. Report any cuts, sores, or bruises to your health care provider immediately. Contact a health care provider if:  You have an injury that is not healing.  You have cuts or breaks in the skin.  You have an ingrown nail.  You  notice redness on your legs or feet.  You feel burning or tingling in your legs or feet.  You have pain or cramps in your legs and feet.  Your legs or feet are numb.  Your feet always feel cold. Get help right away if:  There is increasing redness, swelling, or pain in or around a wound.  There is a red line that goes up your leg.  Pus is coming from a wound.  You develop a fever or as directed by your health care provider.  You notice a bad smell coming from an ulcer or wound. This information is not intended to replace advice given to you by your health care provider. Make sure you discuss any questions you have with your health care provider. Document Released: 09/05/2000 Document Revised: 02/14/2016  Document Reviewed: 02/15/2013 Elsevier Interactive Patient Education  2017 [employer].

## 2016-08-20 NOTE — Progress Notes (Signed)
Patient ID: Joseph Mora, male   DOB: [birth year], 82 y.o.   MRN: [id number]    Subjective: Patient presents today with his wife and son present to treatment room complaining of thickened and elongated toenails over the past 5 years which have gradually become more thickened and deformed over time making shoe wearing walking uncomfortable and is requesting toenail debridement. The patient's wife is present in the treatment room  Patient is a diabetic and denies history of foot ulceration, claudication or amputation  Objective: Vascular: Bilateral peripheral pitting edema DP pulses 0/4 bilaterally PT pulses 0/4 bilaterally  Neurological: Sensation to 10 g monofilament wire intact 3/5 right 2/5 left Vibratory sensation nonreactive right reactive left Ankle reflex equal and reactive bilaterally  Dermatological: No open skin lesions bilaterally Healing scratch dorsal left foot with eschar. There is no surrounding erythema, edema, warmth, drainage Dry scaling skin bilaterally The toenails are elongated, brittle, deformed and tender direct palpation 6-10  Musculoskeletal: No deformities noted bilaterally There is no restriction ankle, subtalar, midtarsal joints bilaterally  Assessment: Diabetic peripheral arterial disease Diabetic peripheral neuropathy  symptomatic onychomycoses 6-10  Plan: The toenails 6-10 were debrided mechanically and electrically with slight bleeding distal third right toe treated with topical antibiotic ointment and Band-Aid. Patient instructed removed Band-Aid 1-3 days and continue to apply topical antibiotic ointment and Band-Aid until a scab forms. Instructed patient to observe this area isan increase in pain, swelling, redness, fever present to the emergency department  Reappoint as 3 months  Reappoint 3 months

## 2016-08-22 ENCOUNTER — Other Ambulatory Visit: Payer: Self-pay | Admitting: Internal Medicine

## 2016-10-02 ENCOUNTER — Encounter (HOSPITAL_COMMUNITY): Payer: Self-pay

## 2016-10-02 DIAGNOSIS — I252 Old myocardial infarction: Secondary | ICD-10-CM | POA: Insufficient documentation

## 2016-10-02 DIAGNOSIS — R42 Dizziness and giddiness: Secondary | ICD-10-CM | POA: Diagnosis not present

## 2016-10-02 DIAGNOSIS — R531 Weakness: Secondary | ICD-10-CM | POA: Insufficient documentation

## 2016-10-02 DIAGNOSIS — Z79899 Other long term (current) drug therapy: Secondary | ICD-10-CM | POA: Insufficient documentation

## 2016-10-02 DIAGNOSIS — E119 Type 2 diabetes mellitus without complications: Secondary | ICD-10-CM | POA: Insufficient documentation

## 2016-10-02 DIAGNOSIS — R51 Headache: Secondary | ICD-10-CM | POA: Diagnosis not present

## 2016-10-02 DIAGNOSIS — Z5321 Procedure and treatment not carried out due to patient leaving prior to being seen by health care provider: Secondary | ICD-10-CM | POA: Insufficient documentation

## 2016-10-02 DIAGNOSIS — Z87891 Personal history of nicotine dependence: Secondary | ICD-10-CM | POA: Insufficient documentation

## 2016-10-02 DIAGNOSIS — I2510 Atherosclerotic heart disease of native coronary artery without angina pectoris: Secondary | ICD-10-CM | POA: Diagnosis not present

## 2016-10-02 DIAGNOSIS — Z7982 Long term (current) use of aspirin: Secondary | ICD-10-CM | POA: Diagnosis not present

## 2016-10-02 LAB — CBC
HCT: 43.6 % (ref 39.0–52.0)
HEMOGLOBIN: 15 g/dL (ref 13.0–17.0)
MCH: 32.2 pg (ref 26.0–34.0)
MCHC: 34.4 g/dL (ref 30.0–36.0)
MCV: 93.6 fL (ref 78.0–100.0)
PLATELETS: 105 10*3/uL — AB (ref 150–400)
RBC: 4.66 MIL/uL (ref 4.22–5.81)
RDW: 13.3 % (ref 11.5–15.5)
WBC: 7.5 10*3/uL (ref 4.0–10.5)

## 2016-10-02 LAB — BASIC METABOLIC PANEL
ANION GAP: 7 (ref 5–15)
BUN: 19 mg/dL (ref 6–20)
CHLORIDE: 106 mmol/L (ref 101–111)
CO2: 22 mmol/L (ref 22–32)
CREATININE: 1.35 mg/dL — AB (ref 0.61–1.24)
Calcium: 8.6 mg/dL — ABNORMAL LOW (ref 8.9–10.3)
GFR calc non Af Amer: 48 mL/min — ABNORMAL LOW (ref >60)
GFR, EST AFRICAN AMERICAN: 55 mL/min — AB (ref >60)
Glucose, Bld: 252 mg/dL — ABNORMAL HIGH (ref 65–99)
Potassium: 4.5 mmol/L (ref 3.5–5.1)
SODIUM: 135 mmol/L (ref 135–145)

## 2016-10-02 LAB — URINALYSIS, ROUTINE W REFLEX MICROSCOPIC
Bilirubin Urine: NEGATIVE
GLUCOSE, UA: 50 mg/dL — AB
Hgb urine dipstick: NEGATIVE
Ketones, ur: NEGATIVE mg/dL
Leukocytes, UA: NEGATIVE
Nitrite: NEGATIVE
PROTEIN: NEGATIVE mg/dL
SPECIFIC GRAVITY, URINE: 1.019 (ref 1.005–1.030)
pH: 5 (ref 5.0–8.0)

## 2016-10-02 LAB — CBG MONITORING, ED: Glucose-Capillary: 229 mg/dL — ABNORMAL HIGH (ref 65–99)

## 2016-10-02 LAB — I-STAT TROPONIN, ED: Troponin i, poc: 0.01 ng/mL (ref 0.00–0.08)

## 2016-10-02 MED ORDER — IBUPROFEN 400 MG PO TABS
ORAL_TABLET | ORAL | Status: AC
  Filled 2016-10-02: qty 1

## 2016-10-02 MED ORDER — IBUPROFEN 400 MG PO TABS
400.0000 mg | ORAL_TABLET | Freq: Once | ORAL | Status: AC | PRN
  Administered 2016-10-02: 400 mg via ORAL

## 2016-10-02 NOTE — ED Triage Notes (Addendum)
Pt reports headache, dizziness and weakness that started about 3 hours PTA, denies CP/SOB. Denies numbness/tingling and denies any vision changes. Pt A&Ox4, speaking clear complete sentences. Denies n/v but reports diarrhea but has noticed no blood in stools. Wife states she sees no facial drooping,  Mouth appears normal

## 2016-10-03 ENCOUNTER — Emergency Department (HOSPITAL_COMMUNITY)
Admission: EM | Admit: 2016-10-03 | Discharge: 2016-10-03 | Disposition: A | Discharge status: Home / Self Care | Payer: PPO | Attending: Emergency Medicine | Admitting: Emergency Medicine

## 2016-10-03 ENCOUNTER — Telehealth: Payer: Self-pay | Admitting: Internal Medicine

## 2016-10-03 NOTE — Telephone Encounter (Signed)
completed

## 2016-10-03 NOTE — ED Notes (Signed)
Patient spoken to multiple times about wait time.  Patient expressing frustrations with wait on multiple occasions.  RN has reassured and explained wait to patient multiple times.  Patient up to desk at this time stating to NT he does not want to wait any longer.  Patient did not want to wait to speak to RN before leaving.

## 2016-10-03 NOTE — Telephone Encounter (Signed)
Patient wife called requesting handicap placard form be filled out.  Advised her there could be a charge and someone would call when it was ready.

## 2016-10-16 ENCOUNTER — Other Ambulatory Visit: Payer: PPO | Admitting: Internal Medicine

## 2016-10-16 DIAGNOSIS — R413 Other amnesia: Secondary | ICD-10-CM

## 2016-10-16 LAB — TSH: TSH: 1.75 mIU/L (ref 0.40–4.50)

## 2016-10-17 ENCOUNTER — Other Ambulatory Visit: Payer: Self-pay | Admitting: Internal Medicine

## 2016-10-17 LAB — VITAMIN B12: Vitamin B-12: 484 pg/mL (ref 200–1100)

## 2016-10-17 LAB — FOLATE: Folate: >24 ng/mL (ref >5.4)

## 2016-10-28 ENCOUNTER — Ambulatory Visit (INDEPENDENT_AMBULATORY_CARE_PROVIDER_SITE_OTHER): Payer: PPO | Admitting: Internal Medicine

## 2016-10-28 VITALS — BP 110/70 | HR 54 | Temp 97.2°F | Wt 201.0 lb

## 2016-10-28 DIAGNOSIS — E538 Deficiency of other specified B group vitamins: Secondary | ICD-10-CM

## 2016-10-28 MED ORDER — CYANOCOBALAMIN 1000 MCG/ML IJ SOLN
1000.0000 ug | Freq: Once | INTRAMUSCULAR | Status: AC
  Administered 2016-10-28: 1000 ug via INTRAMUSCULAR

## 2016-10-28 NOTE — Progress Notes (Signed)
B12 injection given

## 2016-10-28 NOTE — Patient Instructions (Signed)
B12 injection given

## 2016-11-19 ENCOUNTER — Ambulatory Visit (INDEPENDENT_AMBULATORY_CARE_PROVIDER_SITE_OTHER): Payer: PPO | Admitting: Podiatry

## 2016-11-19 ENCOUNTER — Encounter: Payer: Self-pay | Admitting: Podiatry

## 2016-11-19 VITALS — BP 114/49 | HR 75 | Resp 18

## 2016-11-19 DIAGNOSIS — B351 Tinea unguium: Secondary | ICD-10-CM | POA: Diagnosis not present

## 2016-11-19 DIAGNOSIS — M79674 Pain in right toe(s): Secondary | ICD-10-CM

## 2016-11-19 DIAGNOSIS — E1151 Type 2 diabetes mellitus with diabetic peripheral angiopathy without gangrene: Secondary | ICD-10-CM | POA: Diagnosis not present

## 2016-11-19 DIAGNOSIS — M79675 Pain in left toe(s): Secondary | ICD-10-CM

## 2016-11-19 DIAGNOSIS — E0842 Diabetes mellitus due to underlying condition with diabetic polyneuropathy: Secondary | ICD-10-CM | POA: Diagnosis not present

## 2016-11-19 NOTE — Progress Notes (Signed)
Patient ID: Joseph Mora, male   DOB: Feb 24, 1933, 83 y.o.   MRN: [id number]   Subjective: Patient presents today with his wife and son present to treatment room complaining of thickened and elongated toenails over the past 5 years which have gradually become more thickened and deformed over time making shoe wearing walking uncomfortable and is requesting toenail debridement.   Patient is a diabetic and denies history of foot ulceration, claudication or amputation  Patient hard of hearing and appears orientated 3 Objective: Vascular: Bilateral peripheral pitting edema DP pulses 0/4 bilaterally PT pulses 0/4 bilaterally  Neurological: Sensation to 10 g monofilament wire intact 3/5 right 2/5 left Vibratory sensation nonreactive right reactive left Ankle reflex equal and reactive bilaterally  Dermatological: No open skin lesions bilaterally Healing scratch dorsal left foot with eschar. There is no surrounding erythema, edema, warmth, drainage Dry scaling skin bilaterally The toenails are elongated, brittle, deformed and tender direct palpation 6-10  Musculoskeletal: No deformities noted bilaterally There is no restriction ankle, subtalar, midtarsal joints bilaterally  Assessment: Diabetic peripheral arterial disease Diabetic peripheral neuropathy  symptomatic onychomycoses 6-10  Plan: The toenails 6-10 were debrided mechanically and electrically without any bleeding  Reappoint 3 months

## 2016-11-19 NOTE — Patient Instructions (Signed)

## 2016-11-20 ENCOUNTER — Ambulatory Visit (INDEPENDENT_AMBULATORY_CARE_PROVIDER_SITE_OTHER): Payer: PPO | Admitting: Ophthalmology

## 2016-11-20 ENCOUNTER — Encounter: Payer: Self-pay | Admitting: Internal Medicine

## 2016-11-20 DIAGNOSIS — I10 Essential (primary) hypertension: Secondary | ICD-10-CM

## 2016-11-20 DIAGNOSIS — E11311 Type 2 diabetes mellitus with unspecified diabetic retinopathy with macular edema: Secondary | ICD-10-CM | POA: Diagnosis not present

## 2016-11-20 DIAGNOSIS — H43813 Vitreous degeneration, bilateral: Secondary | ICD-10-CM

## 2016-11-20 DIAGNOSIS — E113593 Type 2 diabetes mellitus with proliferative diabetic retinopathy without macular edema, bilateral: Secondary | ICD-10-CM | POA: Diagnosis not present

## 2016-11-20 DIAGNOSIS — H35033 Hypertensive retinopathy, bilateral: Secondary | ICD-10-CM

## 2016-11-20 LAB — HM DIABETES EYE EXAM

## 2016-11-26 ENCOUNTER — Ambulatory Visit (INDEPENDENT_AMBULATORY_CARE_PROVIDER_SITE_OTHER): Payer: PPO | Admitting: Adult Health

## 2016-11-26 ENCOUNTER — Encounter: Payer: Self-pay | Admitting: Adult Health

## 2016-11-26 VITALS — BP 127/65 | HR 71 | Wt 202.8 lb

## 2016-11-26 DIAGNOSIS — R413 Other amnesia: Secondary | ICD-10-CM | POA: Diagnosis not present

## 2016-11-26 MED ORDER — MEMANTINE HCL 28 X 5 MG & 21 X 10 MG PO TABS: ORAL_TABLET | ORAL | 0 refills | Status: DC

## 2016-11-26 NOTE — Progress Notes (Signed)
PATIENT: Joseph Mora DOB: 11-13-1932  REASON FOR VISIT: follow up- memory HISTORY FROM: patient, wife and son  HISTORY OF PRESENT ILLNESS: Joseph Mora is an 83 year old male with a history of memory disturbance and  tremor. He returns today for follow-up. The patient states that his memory has remained the same. His wife states that he has a hard time finding things in the house. She has to keep up with all his appointments. He is able to manage his medications. The patient reports that he does require help with some ADLs mainly due to mobility. His wife handles all the finances and cooking. Denies any trouble sleeping. Denies hallucinations. Reports he has a good appetite. Denies having to give up anything due to his memory. He does not operate a motor vehicle due to his vision. He continues on Aricept and is tolerating it well. He returns today for an evaluation.  HISTORY 05/28/16: Joseph Mora is an 83 year old right-handed white male with a history of a memory disturbance and a history of an essential tremor. The patient indicates that he believes that his memory has actually improved since last seen. He denies any further episodes of hypoglycemia, he indicates that he can feel his blood sugar dropping, and treat it immediately. The patient has a gait disorder, but he uses a cane for ambulation, he denies any falls since last seen. The patient indicates that he does operate a motor vehicle without difficulty. He gets some help keeping up with medications and appointments through his wife, and his wife does all the finances. The patient is tolerating Aricept well, he denies any weight loss or diarrhea.   REVIEW OF SYSTEMS: Out of a complete 14 system review of symptoms, the patient complains only of the following symptoms, and all other reviewed systems are negative.  Incontinence of bladder, frequency of urination, tremors, back pain, walking difficulty, incontinence, runny nose, hearing  loss  ALLERGIES: Allergies  Allergen Reactions  . Morphine And Related Itching  . Penicillins Hives    HOME MEDICATIONS: Outpatient Medications Prior to Visit  Medication Sig Dispense Refill  . aspirin 81 MG tablet Take 81 mg by mouth daily.    Marland Kitchen atorvastatin (LIPITOR) 40 MG tablet TAKE 1 TABLET BY MOUTH DAILY 30 tablet 5  . B-D UF III MINI PEN NEEDLES 31G X 5 MM MISC     . Blood Glucose Monitoring Suppl (ONE TOUCH ULTRA SYSTEM KIT) w/Device KIT 1 kit by Does not apply route once. 1 each 0  . donepezil (ARICEPT) 10 MG tablet Take 1 tablet (10 mg total) by mouth at bedtime. 90 tablet 3  . doxazosin (CARDURA) 2 MG tablet TAKE 1 TABLET BY MOUTH EVERY NIGHT AT BEDTIME 30 tablet 11  . glucose blood test strip Use as instructed 100 each 12  . insulin NPH-regular Human (NOVOLIN 70/30) (70-30) 100 UNIT/ML injection Inject into the skin as directed. 60u in the morning, 10u at lunch, 25u in the evening    . lisinopril (PRINIVIL,ZESTRIL) 20 MG tablet TAKE 1 TABLET BY MOUTH EVERY DAY 30 tablet 11  . metoprolol (LOPRESSOR) 50 MG tablet TAKE 1 TABLET BY MOUTH TWICE DAILY 180 tablet 1  . nitroGLYCERIN (NITROSTAT) 0.4 MG SL tablet Place 1 tablet (0.4 mg total) under the tongue every 5 (five) minutes as needed for chest pain. 25 tablet 6  . tamsulosin (FLOMAX) 0.4 MG CAPS capsule TAKE 1 CAPSULE BY MOUTH EVERY DAY 30 capsule 11  . vitamin B-12 100 MCG tablet  Take 1 tablet (100 mcg total) by mouth daily. 30 tablet 0   No facility-administered medications prior to visit.     PAST MEDICAL HISTORY: Past Medical History:  Diagnosis Date  . Arthritis    RA  . CAD (coronary artery disease)   . Diabetes mellitus   . Hyperlipidemia   . Memory difficulty 06/22/2015  . Myocardial infarction   . Tremor 06/22/2015   Intentional, right hand    PAST SURGICAL HISTORY: Past Surgical History:  Procedure Laterality Date  . BACK SURGERY    . CARPAL TUNNEL RELEASE Bilateral   . CATARACT EXTRACTION Bilateral    . INGUINAL HERNIA REPAIR    . PARATHYROIDECTOMY      FAMILY HISTORY: Family History  Problem Relation Age of Onset  . Heart attack Paternal Aunt   . Stroke Mother   . Diabetes Mother   . Dementia Mother   . Diabetes Sister   . Stroke Sister   . Dementia Sister   . Diabetes Brother   . Dementia Brother   . Hypertension Neg Hx     SOCIAL HISTORY: Social History   Social History  . Marital status: Married    Spouse name: [name]  . Number of children: 4  . Years of education: GED   Occupational History  . retired    Social History Main Topics  . Smoking status: Former Smoker    Quit date: 08/03/2010  . Smokeless tobacco: Current User    Types: Chew  . Alcohol use No  . Drug use: No  . Sexual activity: Not on file   Other Topics Concern  . Not on file   Social History Narrative   Patient drinks 3 cups of caffeine daily.   Patient is right handed.       PHYSICAL EXAM  Vitals:   11/26/16 0947  BP: 127/65  Pulse: 71  Weight: 202 lb 12.8 oz (92 kg)   Body mass index is 33.75 kg/m.   MMSE - Mini Mental State Exam 11/26/2016 05/28/2016 11/22/2015  Orientation to time _0 Orientation to Place _1 Registration _2 Attention/ Calculation 0 1 0  Recall _3 Recall-comments - - remembered 'pen' which he said before  Language- name 2 objects _4 Language- repeat 0 1 0  Language- follow 3 step command _5 Language- read & follow direction _6 Write a sentence 0 1 0  Write a sentence-comments - - 'bad dog''  Copy design 0 1 0  Total score _7 Generalized: Well developed, in no acute distress   Neurological examination  Mentation: Alert. Follows all commands speech and language fluent Cranial nerve II-XII: Pupils were equal round reactive to light. Extraocular movements were full, visual field were full on confrontational test. Facial sensation and strength were normal. Uvula tongue midline. Head turning and shoulder shrug  were  normal and symmetric. Motor: The motor testing reveals 5 over 5 strength of all 4 extremities. Good symmetric motor tone is noted throughout.  Sensory: Sensory testing is intact to soft touch on all 4 extremities. No evidence of extinction is noted.  Coordination: Cerebellar testing reveals good finger-nose-finger and heel-to-shin bilaterally.  Gait and station: Patient uses a cane when ambulating. Tandem gait not attempted. Reflexes: Deep tendon reflexes are symmetric and normal bilaterally.   DIAGNOSTIC DATA (LABS, IMAGING, TESTING) - I reviewed patient  records, labs, notes, testing and imaging myself where available.  Lab Results  Component Value Date   WBC 7.5 10/02/2016   HGB 15.0 10/02/2016   HCT 43.6 10/02/2016   MCV 93.6 10/02/2016   PLT 105 (L) 10/02/2016      Component Value Date/Time   NA 135 10/02/2016 2122   K 4.5 10/02/2016 2122   CL 106 10/02/2016 2122   CO2 22 10/02/2016 2122   GLUCOSE 252 (H) 10/02/2016 2122   BUN 19 10/02/2016 2122   CREATININE 1.35 (H) 10/02/2016 2122   CREATININE 1.26 (H) 07/29/2016 1104   CALCIUM 8.6 (L) 10/02/2016 2122   PROT 6.2 07/29/2016 1104   ALBUMIN 3.7 07/29/2016 1104   AST 21 07/29/2016 1104   ALT 17 07/29/2016 1104   ALKPHOS 69 07/29/2016 1104   BILITOT 0.6 07/29/2016 1104   GFRNONAA 48 (L) 10/02/2016 2122   GFRNONAA 69 05/17/2015 0906   GFRAA 55 (L) 10/02/2016 2122   GFRAA 80 05/17/2015 0906   Lab Results  Component Value Date   CHOL 121 07/29/2016   HDL 44 07/29/2016   LDLCALC 63 07/29/2016   TRIG 68 07/29/2016   CHOLHDL 2.8 07/29/2016   Lab Results  Component Value Date   HGBA1C 7.0 (H) 07/29/2016   Lab Results  Component Value Date   VITAMINB12 484 10/16/2016   Lab Results  Component Value Date   TSH 1.75 10/16/2016      ASSESSMENT AND PLAN 83 y.o. year old male  has a past medical history of Arthritis; CAD (coronary artery disease); Diabetes mellitus; Hyperlipidemia; Memory difficulty (06/22/2015);  Myocardial infarction; and Tremor (06/22/2015). here with:  1. Memory disturbance  The patient's memory score has slightly declined. He will continue on Aricept. I will start Namenda. He will begin with a titration pack. On the fourth week his titration pack he should call our office to receive a new prescription. The patient and his wife verbalized understanding. Also reviewed side effects with the patient and his wife as well as provided him with a handout. Advised that if his symptoms worsen or he develops new symptoms he should let us know. He will follow-up in 6 months or sooner if needed.  I spent 15 minutes with the patient 50% of this time was spent discussing new medication.    Ward Givens, MSN, NP-C 11/26/2016, 9:59 AM [facility] [address], [place], [place] [address] [phone]

## 2016-11-26 NOTE — Progress Notes (Signed)
I have read the note, and I agree with the clinical assessment and plan.  WILLIS,CHARLES KEITH   

## 2016-11-26 NOTE — Patient Instructions (Signed)
Memory score slightly decreased Continue Aricept  Start Namenda titration pack. Once you are on the last week please call for maintenance dose Memantine extended release capsules What is this medicine? MEMANTINE (MEM an teen) is used to treat dementia caused by Alzheimer's disease. This medicine may be used for other purposes; ask your health care provider or pharmacist if you have questions. COMMON BRAND NAME(S): Namenda XR What should I tell my health care provider before I take this medicine? They need to know if you have any of these conditions: -difficulty passing urine -kidney disease -liver disease -seizures -an unusual or allergic reaction to memantine, other medicines, foods, dyes, or preservatives -pregnant or trying to get pregnant -breast-feeding How should I use this medicine? Take this medicine by mouth with a glass of water. Follow the directions on the prescription label. You may take this medicine with or without food. You may swallow the capsules whole or open them and sprinkle the entire contents on applesauce before swallowing. Other than sprinkling the medicine on applesauce, the capsules should be swallowed whole and not divided, chewed, or crushed. Take your doses at regular intervals. Do not take your medicine more often than directed. Continue to take your medicine even if you feel better. Do not stop taking except on the advice of your doctor or health care professional. Talk to your pediatrician regarding the use of this medicine in children. Special care may be needed. Overdosage: If you think you have taken too much of this medicine contact a poison control center or emergency room at once. NOTE: This medicine is only for you. Do not share this medicine with others. What if I miss a dose? If you miss a dose, take it as soon as you can. If it is almost time for your next dose, take only that dose. Do not take double or extra doses. If you do not take your medicine  for several days, contact your health care provider. Your dose may need to be changed. What may interact with this medicine? -acetazolamide -amantadine -cimetidine -dextromethorphan -dofetilide -hydrochlorothiazide -ketamine -metformin -methazolamide -quinidine -ranitidine -sodium bicarbonate -triamterene This list may not describe all possible interactions. Give your health care provider a list of all the medicines, herbs, non-prescription drugs, or dietary supplements you use. Also tell them if you smoke, drink alcohol, or use illegal drugs. Some items may interact with your medicine. What should I watch for while using this medicine? Visit your doctor or health care professional for regular checks on your progress. Check with your doctor or health care professional if there is no improvement in your symptoms or if they get worse. You may get drowsy or dizzy. Do not drive, use machinery, or do anything that needs mental alertness until you know how this drug affects you. Do not stand or sit up quickly, especially if you are an older patient. This reduces the risk of dizzy or fainting spells. Alcohol can make you more drowsy and dizzy. Avoid alcoholic drinks. What side effects may I notice from receiving this medicine? Side effects that you should report to your doctor or health care professional as soon as possible: -agitation or a feeling of restlessness -allergic reactions like skin rash, itching or hives, swelling of the face, lips, or tongue -depressed mood -dizziness -hallucinations -redness, blistering, peeling or loosening of the skin, including inside the mouth -seizures -vomiting Side effects that usually do not require medical attention (report to your doctor or health care professional if they continue or are  bothersome): -constipation -diarrhea -headache -nausea -trouble sleeping This list may not describe all possible side effects. Call your doctor for medical advice  about side effects. You may report side effects to FDA at 1-800-FDA-1088. Where should I keep my medicine? Keep out of the reach of children. Store at room temperature between 15 degrees and 30 degrees C (59 degrees and 86 degrees F). Throw away any unused medicine after the expiration date. NOTE: This sheet is a summary. It may not cover all possible information. If you have questions about this medicine, talk to your doctor, pharmacist, or health care provider.  2018 Elsevier/Gold Standard (2015-10-11 10:09:47)

## 2016-11-27 ENCOUNTER — Ambulatory Visit (INDEPENDENT_AMBULATORY_CARE_PROVIDER_SITE_OTHER): Payer: PPO | Admitting: Internal Medicine

## 2016-11-27 VITALS — BP 100/60 | HR 72 | Temp 97.0°F

## 2016-11-27 DIAGNOSIS — E538 Deficiency of other specified B group vitamins: Secondary | ICD-10-CM

## 2016-11-27 DIAGNOSIS — R413 Other amnesia: Secondary | ICD-10-CM | POA: Diagnosis not present

## 2016-11-27 MED ORDER — CYANOCOBALAMIN 1000 MCG/ML IJ SOLN
1000.0000 ug | Freq: Once | INTRAMUSCULAR | Status: AC
  Administered 2016-11-27: 1000 ug via INTRAMUSCULAR

## 2016-11-27 NOTE — Progress Notes (Signed)
B12 injection 1 cc Im given

## 2016-11-27 NOTE — Patient Instructions (Addendum)
Received B12 shot, pt to come in a month.

## 2016-12-04 ENCOUNTER — Encounter (INDEPENDENT_AMBULATORY_CARE_PROVIDER_SITE_OTHER): Payer: PPO | Admitting: Ophthalmology

## 2016-12-04 DIAGNOSIS — E113593 Type 2 diabetes mellitus with proliferative diabetic retinopathy without macular edema, bilateral: Secondary | ICD-10-CM

## 2016-12-04 DIAGNOSIS — E11319 Type 2 diabetes mellitus with unspecified diabetic retinopathy without macular edema: Secondary | ICD-10-CM | POA: Diagnosis not present

## 2016-12-08 DIAGNOSIS — E7800 Pure hypercholesterolemia, unspecified: Secondary | ICD-10-CM | POA: Diagnosis not present

## 2016-12-08 DIAGNOSIS — E1165 Type 2 diabetes mellitus with hyperglycemia: Secondary | ICD-10-CM | POA: Diagnosis not present

## 2016-12-08 DIAGNOSIS — I10 Essential (primary) hypertension: Secondary | ICD-10-CM | POA: Diagnosis not present

## 2016-12-12 ENCOUNTER — Other Ambulatory Visit: Payer: Self-pay | Admitting: Neurology

## 2016-12-19 ENCOUNTER — Telehealth: Payer: Self-pay | Admitting: Adult Health

## 2016-12-19 MED ORDER — MEMANTINE HCL 10 MG PO TABS: 10.0000 mg | ORAL_TABLET | Freq: Two times a day (BID) | ORAL | 11 refills | Status: DC

## 2016-12-19 NOTE — Telephone Encounter (Signed)
I called and spoke to wife- Trennon Torbeck. Advised new prescription for Namenda 10 mg BID was sent to pharmacy. She voiced understanding. Will call with any other issues .

## 2016-12-19 NOTE — Addendum Note (Signed)
Addended by: Trudie Buckler on: 12/19/2016 11:46 AM   Modules accepted: Orders

## 2016-12-19 NOTE — Telephone Encounter (Signed)
Pt wife has called as she was instructed to do to inform Joseph Mora that pt has 5 pills left of the memantine (NAMENDA TITRATION PAK) tablet pack  She said they still use  [facility] [place], [place] [address] [facility] [address]

## 2016-12-26 ENCOUNTER — Ambulatory Visit (INDEPENDENT_AMBULATORY_CARE_PROVIDER_SITE_OTHER): Payer: PPO | Admitting: Internal Medicine

## 2016-12-26 VITALS — BP 100/60 | HR 76 | Temp 97.3°F

## 2016-12-26 DIAGNOSIS — R413 Other amnesia: Secondary | ICD-10-CM | POA: Diagnosis not present

## 2016-12-26 DIAGNOSIS — E538 Deficiency of other specified B group vitamins: Secondary | ICD-10-CM | POA: Diagnosis not present

## 2016-12-26 MED ORDER — CYANOCOBALAMIN 1000 MCG/ML IJ SOLN
1000.0000 ug | Freq: Once | INTRAMUSCULAR | Status: AC
  Administered 2016-12-26: 1000 ug via INTRAMUSCULAR

## 2016-12-26 NOTE — Patient Instructions (Signed)
Pt received a B12  Today.

## 2016-12-26 NOTE — Progress Notes (Signed)
B12 1 cc Im given. RTC one month.

## 2017-01-14 ENCOUNTER — Other Ambulatory Visit: Payer: Self-pay | Admitting: Internal Medicine

## 2017-01-27 ENCOUNTER — Other Ambulatory Visit (INDEPENDENT_AMBULATORY_CARE_PROVIDER_SITE_OTHER): Payer: PPO | Admitting: Internal Medicine

## 2017-01-27 ENCOUNTER — Other Ambulatory Visit: Payer: Self-pay | Admitting: Internal Medicine

## 2017-01-27 DIAGNOSIS — Z79899 Other long term (current) drug therapy: Secondary | ICD-10-CM | POA: Diagnosis not present

## 2017-01-27 DIAGNOSIS — I10 Essential (primary) hypertension: Secondary | ICD-10-CM

## 2017-01-27 DIAGNOSIS — N189 Chronic kidney disease, unspecified: Secondary | ICD-10-CM | POA: Diagnosis not present

## 2017-01-27 DIAGNOSIS — E785 Hyperlipidemia, unspecified: Secondary | ICD-10-CM

## 2017-01-27 DIAGNOSIS — E538 Deficiency of other specified B group vitamins: Secondary | ICD-10-CM | POA: Diagnosis not present

## 2017-01-27 DIAGNOSIS — E119 Type 2 diabetes mellitus without complications: Secondary | ICD-10-CM | POA: Diagnosis not present

## 2017-01-27 LAB — BASIC METABOLIC PANEL
BUN: 13 mg/dL (ref 7–25)
CO2: 29 mmol/L (ref 20–31)
CREATININE: 1.12 mg/dL — AB (ref 0.70–1.11)
Calcium: 8.8 mg/dL (ref 8.6–10.3)
Chloride: 104 mmol/L (ref 98–110)
Glucose, Bld: 194 mg/dL — ABNORMAL HIGH (ref 65–99)
Potassium: 4.8 mmol/L (ref 3.5–5.3)
Sodium: 140 mmol/L (ref 135–146)

## 2017-01-27 LAB — LIPID PANEL
CHOLESTEROL: 119 mg/dL (ref <200)
HDL: 41 mg/dL (ref >40)
LDL Cholesterol: 58 mg/dL (ref <100)
Total CHOL/HDL Ratio: 2.9 Ratio (ref <5.0)
Triglycerides: 99 mg/dL (ref <150)
VLDL: 20 mg/dL (ref <30)

## 2017-01-27 LAB — HEPATIC FUNCTION PANEL
ALBUMIN: 3.7 g/dL (ref 3.6–5.1)
ALK PHOS: 77 U/L (ref 40–115)
ALT: 16 U/L (ref 9–46)
AST: 18 U/L (ref 10–35)
BILIRUBIN INDIRECT: 0.5 mg/dL (ref 0.2–1.2)
Bilirubin, Direct: 0.1 mg/dL (ref <=0.2)
TOTAL PROTEIN: 6.2 g/dL (ref 6.1–8.1)
Total Bilirubin: 0.6 mg/dL (ref 0.2–1.2)

## 2017-01-27 MED ORDER — CYANOCOBALAMIN 1000 MCG/ML IJ SOLN
1000.0000 ug | Freq: Once | INTRAMUSCULAR | Status: AC
  Administered 2017-01-27: 1000 ug via INTRAMUSCULAR

## 2017-01-28 ENCOUNTER — Other Ambulatory Visit: Payer: Self-pay | Admitting: Internal Medicine

## 2017-01-28 DIAGNOSIS — E538 Deficiency of other specified B group vitamins: Secondary | ICD-10-CM

## 2017-01-28 LAB — MICROALBUMIN / CREATININE URINE RATIO
CREATININE, URINE: 62 mg/dL (ref 20–370)
Microalb, Ur: <0.2 mg/dL

## 2017-01-28 LAB — HEMOGLOBIN A1C
Hgb A1c MFr Bld: 7.5 % — ABNORMAL HIGH (ref <5.7)
Mean Plasma Glucose: 169 mg/dL

## 2017-01-29 ENCOUNTER — Ambulatory Visit (INDEPENDENT_AMBULATORY_CARE_PROVIDER_SITE_OTHER): Payer: PPO | Admitting: Internal Medicine

## 2017-01-29 ENCOUNTER — Encounter: Payer: Self-pay | Admitting: Internal Medicine

## 2017-01-29 VITALS — BP 120/60 | HR 66 | Wt 201.0 lb

## 2017-01-29 DIAGNOSIS — G8929 Other chronic pain: Secondary | ICD-10-CM

## 2017-01-29 DIAGNOSIS — R413 Other amnesia: Secondary | ICD-10-CM

## 2017-01-29 DIAGNOSIS — IMO0001 Reserved for inherently not codable concepts without codable children: Secondary | ICD-10-CM

## 2017-01-29 DIAGNOSIS — R269 Unspecified abnormalities of gait and mobility: Secondary | ICD-10-CM

## 2017-01-29 DIAGNOSIS — E119 Type 2 diabetes mellitus without complications: Secondary | ICD-10-CM

## 2017-01-29 DIAGNOSIS — E784 Other hyperlipidemia: Secondary | ICD-10-CM

## 2017-01-29 DIAGNOSIS — M545 Low back pain, unspecified: Secondary | ICD-10-CM

## 2017-01-29 DIAGNOSIS — I10 Essential (primary) hypertension: Secondary | ICD-10-CM | POA: Diagnosis not present

## 2017-01-29 DIAGNOSIS — Z794 Long term (current) use of insulin: Secondary | ICD-10-CM | POA: Diagnosis not present

## 2017-01-29 DIAGNOSIS — I519 Heart disease, unspecified: Secondary | ICD-10-CM

## 2017-01-29 DIAGNOSIS — G250 Essential tremor: Secondary | ICD-10-CM | POA: Diagnosis not present

## 2017-01-29 DIAGNOSIS — E7849 Other hyperlipidemia: Secondary | ICD-10-CM

## 2017-01-29 DIAGNOSIS — E7800 Pure hypercholesterolemia, unspecified: Secondary | ICD-10-CM | POA: Diagnosis not present

## 2017-01-29 LAB — VITAMIN B12: Vitamin B-12: 701 pg/mL (ref 200–1100)

## 2017-01-29 NOTE — Progress Notes (Signed)
   Subjective:    Patient ID: Joseph Mora, male    DOB: 08/26/1933, 83 y.o.   MRN: [id number]  HPI Has gained 6 pounds since November. Discussed exercise. Labs reviewed  Hgb AIC is 7.5%. BP is excellent.Lipid and liver panels normal.  Knows  President, day of week, year, not good with serial 7s.  No chest pain. No SOB persistent back pain which is long standing. Cannot walk far due to back pain.  Back brace does not help  And does not want pain meds. Has had issues in the past with hypoglycemia.  Thinks B12 injections have helped memory. Level is OK.    Review of Systems chronic back pain     Objective:   Physical Exam Skin warm and dry. Nodes none. Neck is supple. No carotid bruits. No thyromegaly. Chest clear. Cardiac exam regular rate and rhythm normal S1 and S2. Extremities without edema.       Assessment & Plan:  Insulin-dependent diabetes-A1c is stable at 7.5%  Memory loss-currently being treated with monthly B-12 injections. Continue to monitor with neurology visits  Chronic low back pain  Coronary artery disease-stable with no new complaints  Essential hypertension  Hyperlipidemia-lipid panel is stable. His B-12 level and January was 484 at which time we started B-12 injections. His B-12 level is now 701.  Plan: Continue same medications and return in 6 months. Continue monthly B-12 injections.

## 2017-02-16 NOTE — Patient Instructions (Signed)
It was a pleasure to see you today. Continue same medications and monthly B-12 injections. Return in 6 months

## 2017-02-17 ENCOUNTER — Encounter: Payer: Self-pay | Admitting: Podiatry

## 2017-02-17 ENCOUNTER — Ambulatory Visit (INDEPENDENT_AMBULATORY_CARE_PROVIDER_SITE_OTHER): Payer: PPO | Admitting: Podiatry

## 2017-02-17 DIAGNOSIS — M79674 Pain in right toe(s): Secondary | ICD-10-CM

## 2017-02-17 DIAGNOSIS — B351 Tinea unguium: Secondary | ICD-10-CM | POA: Diagnosis not present

## 2017-02-17 DIAGNOSIS — M79675 Pain in left toe(s): Secondary | ICD-10-CM | POA: Diagnosis not present

## 2017-02-17 DIAGNOSIS — E1151 Type 2 diabetes mellitus with diabetic peripheral angiopathy without gangrene: Secondary | ICD-10-CM | POA: Diagnosis not present

## 2017-02-17 DIAGNOSIS — E1142 Type 2 diabetes mellitus with diabetic polyneuropathy: Secondary | ICD-10-CM | POA: Diagnosis not present

## 2017-02-17 NOTE — Patient Instructions (Signed)

## 2017-02-17 NOTE — Progress Notes (Signed)
Patient ID: Joseph Mora, male   DOB: 10/25/1932, 83 y.o.   MRN: [id number]   Subjective: Patient presents today with his wife and son present to treatment room complaining of thickened and elongated toenails over the past 5 years which have gradually become more thickened and deformed over time making shoe wearing walking uncomfortable and is requesting toenail debridement.  Patient is a diabetic and denies history of foot ulceration, claudication or amputation  Patient hard of hearing and appears orientated 3 Objective: Vascular: Bilateral peripheral pitting edema DP pulses 0/4 bilaterally PT pulses 0/4 bilaterally  Neurological: Sensation to 10 g monofilament wire intact 3/5 right 2/5 left Vibratory sensation nonreactive right reactive left Ankle reflex equal and reactive bilaterally  Dermatological: No open skin lesions bilaterally Atrophic skin, absent hair growth bilaterally Dry scaling skin bilaterally The toenails are elongated, brittle, deformed and tender direct palpation 6-10  Musculoskeletal: No deformities noted bilaterally There is no restriction ankle, subtalar, midtarsal joints bilaterally  Assessment: Diabetic peripheral arterial disease Diabetic peripheral neuropathy symptomatic onychomycoses 6-10  Plan: The toenails 6-10 were debrided mechanically and electrically without any bleeding  Reappoint 3 months 

## 2017-02-27 ENCOUNTER — Ambulatory Visit (INDEPENDENT_AMBULATORY_CARE_PROVIDER_SITE_OTHER): Payer: PPO | Admitting: Internal Medicine

## 2017-02-27 VITALS — BP 110/58 | HR 74 | Temp 97.3°F

## 2017-02-27 DIAGNOSIS — E538 Deficiency of other specified B group vitamins: Secondary | ICD-10-CM | POA: Diagnosis not present

## 2017-02-27 MED ORDER — CYANOCOBALAMIN 1000 MCG/ML IJ SOLN
1000.0000 ug | Freq: Once | INTRAMUSCULAR | Status: AC
  Administered 2017-02-27: 1000 ug via INTRAMUSCULAR

## 2017-02-27 NOTE — Patient Instructions (Signed)
B12 injection given. RTC in one month

## 2017-02-27 NOTE — Progress Notes (Signed)
B12 66ml IM given right buttock.   RTC 1 month Lenise Arena, CMA

## 2017-03-24 ENCOUNTER — Other Ambulatory Visit: Payer: Self-pay | Admitting: Internal Medicine

## 2017-03-30 ENCOUNTER — Ambulatory Visit (INDEPENDENT_AMBULATORY_CARE_PROVIDER_SITE_OTHER): Payer: PPO | Admitting: Internal Medicine

## 2017-03-30 ENCOUNTER — Encounter: Payer: Self-pay | Admitting: Internal Medicine

## 2017-03-30 VITALS — BP 108/62 | HR 76 | Temp 97.0°F

## 2017-03-30 DIAGNOSIS — R413 Other amnesia: Secondary | ICD-10-CM | POA: Diagnosis not present

## 2017-03-30 MED ORDER — CYANOCOBALAMIN 1000 MCG/ML IJ SOLN
1000.0000 ug | Freq: Once | INTRAMUSCULAR | Status: AC
  Administered 2017-03-30: 1000 ug via INTRAMUSCULAR

## 2017-03-30 NOTE — Patient Instructions (Signed)
B12 74ml IM given today. Return in one month

## 2017-03-30 NOTE — Progress Notes (Signed)
B12 54ml IM given in the Left Ventrogluteal, pt tolerated well.  RTC 1 month Lenise Arena, CMA

## 2017-05-13 DIAGNOSIS — L820 Inflamed seborrheic keratosis: Secondary | ICD-10-CM | POA: Diagnosis not present

## 2017-05-13 DIAGNOSIS — L821 Other seborrheic keratosis: Secondary | ICD-10-CM | POA: Diagnosis not present

## 2017-05-13 DIAGNOSIS — L910 Hypertrophic scar: Secondary | ICD-10-CM | POA: Diagnosis not present

## 2017-05-15 ENCOUNTER — Encounter: Payer: Self-pay | Admitting: Adult Health

## 2017-05-15 ENCOUNTER — Other Ambulatory Visit: Payer: Self-pay | Admitting: Internal Medicine

## 2017-05-18 ENCOUNTER — Encounter: Payer: Self-pay | Admitting: Podiatry

## 2017-05-18 ENCOUNTER — Ambulatory Visit (INDEPENDENT_AMBULATORY_CARE_PROVIDER_SITE_OTHER): Payer: PPO | Admitting: Podiatry

## 2017-05-18 DIAGNOSIS — M79674 Pain in right toe(s): Secondary | ICD-10-CM

## 2017-05-18 DIAGNOSIS — B351 Tinea unguium: Secondary | ICD-10-CM | POA: Diagnosis not present

## 2017-05-18 DIAGNOSIS — E1142 Type 2 diabetes mellitus with diabetic polyneuropathy: Secondary | ICD-10-CM | POA: Diagnosis not present

## 2017-05-18 DIAGNOSIS — M79675 Pain in left toe(s): Secondary | ICD-10-CM

## 2017-05-18 DIAGNOSIS — E1151 Type 2 diabetes mellitus with diabetic peripheral angiopathy without gangrene: Secondary | ICD-10-CM | POA: Diagnosis not present

## 2017-05-18 NOTE — Progress Notes (Signed)
Patient ID: Joseph Mora, male   DOB: 04-12-1933, 83 y.o.   MRN: [id number]   Subjective: Patient presents today with his wife and son present to treatment room complaining of thickened and elongated toenails over the past 5 years which have gradually become more thickened and deformed over time making shoe wearing walking uncomfortable and is requesting toenail debridement.  Patient is a diabetic and denies history of foot ulceration, claudication or amputation  Patient hard of hearing and appears orientated 3 Objective: Vascular: Bilateral peripheral pitting edema DP pulses 0/4 bilaterally PT pulses 0/4 bilaterally  Neurological: Sensation to 10 g monofilament wire intact 3/5 right 2/5 left Vibratory sensation nonreactive right reactive left Ankle reflex equal and reactive bilaterally  Dermatological: No open skin lesions bilaterally Atrophic skin, absent hair growth bilaterally Dry scaling skin bilaterally The toenails are elongated, brittle, deformed and tender direct palpation 6-10  Musculoskeletal: No deformities noted bilaterally There is no restriction ankle, subtalar, midtarsal joints bilaterally  Assessment: Diabetic peripheral arterial disease Diabetic peripheral neuropathy symptomatic onychomycoses 6-10  Plan: The toenails 6-10 were debrided mechanically and electrically without any bleeding  Reappoint 3 months

## 2017-05-18 NOTE — Patient Instructions (Signed)

## 2017-05-20 ENCOUNTER — Ambulatory Visit (INDEPENDENT_AMBULATORY_CARE_PROVIDER_SITE_OTHER): Payer: PPO | Admitting: Adult Health

## 2017-05-20 ENCOUNTER — Encounter: Payer: Self-pay | Admitting: Adult Health

## 2017-05-20 ENCOUNTER — Ambulatory Visit: Payer: PPO | Admitting: Podiatry

## 2017-05-20 VITALS — BP 127/77 | HR 71 | Ht 65.0 in | Wt 198.8 lb

## 2017-05-20 DIAGNOSIS — R413 Other amnesia: Secondary | ICD-10-CM

## 2017-05-20 NOTE — Patient Instructions (Signed)
Your Plan:  Continue Aricept and Namenda Memory score is stable If your symptoms worsen or you develop new symptoms please let us know.    Thank you for coming to see us at [facility]. I hope we have been able to provide you high quality care today.  You may receive a patient satisfaction survey over the next few weeks. We would appreciate your feedback and comments so that we may continue to improve ourselves and the health of our patients.  

## 2017-05-20 NOTE — Progress Notes (Signed)
PATIENT: Joseph Mora DOB: [birth year]  REASON FOR VISIT: follow up- memory HISTORY FROM: patient  HISTORY OF PRESENT ILLNESS: Today 05/20/17 Joseph Mora is an 83 year old male with a history of memory disturbance and tremor. He returns today for follow-up. The patient is on Aricept and Namenda. Reports that since we started Namenda he has tolerated it well. Patient does require assistance with ADLs however this is due to back pain. The patient does have difficulty ambulating due to ongoing back pain. States he can't  ambulate long distances without resting. He does not operate a motor vehicle. Reports a good appetite. Patient denies any changes in his mood or behavior per wife states that he has grouchy at times. Denies hallucinations. He returns today for an evaluation.  HISTORY 11/26/16: Joseph Mora is an 83 year old male with a history of memory disturbance and  tremor. He returns today for follow-up. The patient states that his memory has remained the same. His wife states that he has a hard time finding things in the house. She has to keep up with all his appointments. He is able to manage his medications. The patient reports that he does require help with some ADLs mainly due to mobility. His wife handles all the finances and cooking. Denies any trouble sleeping. Denies hallucinations. Reports he has a good appetite. Denies having to give up anything due to his memory. He does not operate a motor vehicle due to his vision. He continues on Aricept and is tolerating it well. He returns today for an evaluation.  HISTORY 05/28/16: Joseph Mora is an 83 year old right-handed white male with a history of a memory disturbance and a history of anessential tremor. The patient indicates that he believes that his memory has actually improved since last seen. He denies any further episodes of hypoglycemia, he indicates that he can feel his blood sugar dropping, and treat itimmediately. The patient has a gait  disorder, but he uses a cane for ambulation, he denies any falls since last seen. The patient indicates that he does operate a motor vehicle without difficulty. He gets some help keepingup with medications and appointments through his wife, and his wife does all the finances. The patient is tolerating Aricept well, he denies any weight loss or diarrhea.   REVIEW OF SYSTEMS: Out of a complete 14 system review of symptoms, the patient complains only of the following symptoms, and all other reviewed systems are negative.  Incontinence of bladder, frequency of urination, urgency, tremors, memory loss, back pain, walking difficulty, incontinence of bladder, daytime sleepiness, leg swelling, runny nose ALLERGIES: Allergies  Allergen Reactions  . Morphine And Related Itching  . Penicillins Hives    HOME MEDICATIONS: Outpatient Medications Prior to Visit  Medication Sig Dispense Refill  . aspirin 81 MG tablet Take 81 mg by mouth daily.    Marland Kitchen atorvastatin (LIPITOR) 40 MG tablet TAKE 1 TABLET BY MOUTH DAILY 30 tablet 5  . B-D UF III MINI PEN NEEDLES 31G X 5 MM MISC     . Blood Glucose Monitoring Suppl (ONE TOUCH ULTRA SYSTEM KIT) w/Device KIT 1 kit by Does not apply route once. 1 each 0  . donepezil (ARICEPT) 10 MG tablet TAKE 1 TABLET(10 MG) BY MOUTH AT BEDTIME 90 tablet 3  . doxazosin (CARDURA) 2 MG tablet TAKE 1 TABLET BY MOUTH EVERY NIGHT AT BEDTIME 90 tablet 2  . glucose blood test strip Use as instructed 100 each 12  . insulin NPH-regular Human (NOVOLIN 70/30) (70-30)  100 UNIT/ML injection Inject into the skin as directed. 60u in the morning, 10u at lunch, 25u in the evening    . lisinopril (PRINIVIL,ZESTRIL) 20 MG tablet TAKE 1 TABLET BY MOUTH EVERY DAY 30 tablet 11  . memantine (NAMENDA) 10 MG tablet Take 1 tablet (10 mg total) by mouth 2 (two) times daily. 60 tablet 11  . metoprolol tartrate (LOPRESSOR) 50 MG tablet TAKE 1 TABLET BY MOUTH TWICE DAILY 180 tablet 3  . nitroGLYCERIN  (NITROSTAT) 0.4 MG SL tablet Place 1 tablet (0.4 mg total) under the tongue every 5 (five) minutes as needed for chest pain. 25 tablet 6  . tamsulosin (FLOMAX) 0.4 MG CAPS capsule TAKE 1 CAPSULE BY MOUTH EVERY DAY 90 capsule 3  . vitamin B-12 100 MCG tablet Take 1 tablet (100 mcg total) by mouth daily. 30 tablet 0   No facility-administered medications prior to visit.     PAST MEDICAL HISTORY: Past Medical History:  Diagnosis Date  . Arthritis    RA  . CAD (coronary artery disease)   . Diabetes mellitus   . Hyperlipidemia   . Memory difficulty 06/22/2015  . Myocardial infarction ([place])   . Tremor 06/22/2015   Intentional, right hand    PAST SURGICAL HISTORY: Past Surgical History:  Procedure Laterality Date  . BACK SURGERY    . CARPAL TUNNEL RELEASE Bilateral   . CATARACT EXTRACTION Bilateral   . INGUINAL HERNIA REPAIR    . PARATHYROIDECTOMY      FAMILY HISTORY: Family History  Problem Relation Age of Onset  . Heart attack Paternal Aunt   . Stroke Mother   . Diabetes Mother   . Dementia Mother   . Diabetes Sister   . Stroke Sister   . Dementia Sister   . Diabetes Brother   . Dementia Brother   . Hypertension Neg Hx     SOCIAL HISTORY: Social History   Social History  . Marital status: Married    Spouse name: [name]  . Number of children: 4  . Years of education: GED   Occupational History  . retired    Social History Main Topics  . Smoking status: Former Smoker    Quit date: 08/03/2010  . Smokeless tobacco: Current User    Types: Chew  . Alcohol use No  . Drug use: No  . Sexual activity: Not on file   Other Topics Concern  . Not on file   Social History Narrative   Patient drinks 3 cups of caffeine daily.   Patient is right handed.       PHYSICAL EXAM  Vitals:   05/20/17 0900  BP: 127/77  Pulse: 71  Weight: 198 lb 12.8 oz (90.2 kg)  Height: 5' 5"  (1.651 m)   Body mass index is 33.08 kg/m.   MMSE - Mini Mental State Exam 05/20/2017  11/26/2016 05/28/2016  Orientation to time 2 1 2   Orientation to Place 4 5 5   Registration 3 3 3   Attention/ Calculation 0 0 1  Recall 1 3 2   Recall-comments - - -  Language- name 2 objects 2 2 2   Language- repeat 1 0 1  Language- follow 3 step command 3 3 3   Language- read & follow direction 1 1 1   Write a sentence 1 0 1  Write a sentence-comments - - -  Copy design 0 0 1  Total score 18 18 22      Generalized: Well developed, in no acute distress   Neurological examination  Mentation: Alert oriented to time, place, history taking. Follows all commands speech and language fluent Cranial nerve II-XII: Pupils were equal round reactive to light. Extraocular movements were full, visual field were full on confrontational test. Facial sensation and strength were normal. Uvula tongue midline. Head turning and shoulder shrug  were normal and symmetric. Motor: The motor testing reveals 5 over 5 strength of all 4 extremities. Good symmetric motor tone is noted throughout.  Sensory: Sensory testing is intact to soft touch on all 4 extremities. No evidence of extinction is noted.  Coordination: Cerebellar testing reveals good finger-nose-finger and heel-to-shin bilaterally.  Gait and station: Gait is wide-based. He uses a cane when ambulating. Tandem gait is not attempted. Reflexes: Deep tendon reflexes are symmetric and normal bilaterally.   DIAGNOSTIC DATA (LABS, IMAGING, TESTING) - I reviewed patient records, labs, notes, testing and imaging myself where available.  Lab Results  Component Value Date   WBC 7.5 10/02/2016   HGB 15.0 10/02/2016   HCT 43.6 10/02/2016   MCV 93.6 10/02/2016   PLT 105 (L) 10/02/2016      Component Value Date/Time   NA 140 01/27/2017 1128   K 4.8 01/27/2017 1128   CL 104 01/27/2017 1128   CO2 29 01/27/2017 1128   GLUCOSE 194 (H) 01/27/2017 1128   BUN 13 01/27/2017 1128   CREATININE 1.12 (H) 01/27/2017 1128   CALCIUM 8.8 01/27/2017 1128   PROT 6.2  01/27/2017 1128   ALBUMIN 3.7 01/27/2017 1128   AST 18 01/27/2017 1128   ALT 16 01/27/2017 1128   ALKPHOS 77 01/27/2017 1128   BILITOT 0.6 01/27/2017 1128   GFRNONAA 48 (L) 10/02/2016 2122   GFRNONAA 69 05/17/2015 0906   GFRAA 55 (L) 10/02/2016 2122   GFRAA 80 05/17/2015 0906   Lab Results  Component Value Date   CHOL 119 01/27/2017   HDL 41 01/27/2017   LDLCALC 58 01/27/2017   TRIG 99 01/27/2017   CHOLHDL 2.9 01/27/2017   Lab Results  Component Value Date   HGBA1C 7.5 (H) 01/27/2017   Lab Results  Component Value Date   DZHGDJME26 834 01/27/2017   Lab Results  Component Value Date   TSH 1.75 10/16/2016      ASSESSMENT AND PLAN 83 y.o. year old male  has a past medical history of Arthritis; CAD (coronary artery disease); Diabetes mellitus; Hyperlipidemia; Memory difficulty (06/22/2015); Myocardial infarction [facility]); and Tremor (06/22/2015). here with :  1. Memory disturbance  The patient's memory score has remained stable. I advised that he should continue on Aricept and Namenda. We will continue to monitor his memory. Advised the patient and his wife that if his symptoms worsen or he develops new symptoms he should let us know. He will follow-up in 6 months or sooner if needed.  I spent 15 minutes with the patient. 50% of this time was spent reviewing memory score    Ward Givens, MSN, NP-C 05/20/2017, 9:26 AM [facility] [address], [place], [place] [address] [phone]

## 2017-05-21 NOTE — Progress Notes (Signed)
I agree with the above plan 

## 2017-06-01 ENCOUNTER — Ambulatory Visit: Payer: PPO | Admitting: Adult Health

## 2017-06-11 ENCOUNTER — Ambulatory Visit (INDEPENDENT_AMBULATORY_CARE_PROVIDER_SITE_OTHER): Payer: PPO | Admitting: Ophthalmology

## 2017-06-11 DIAGNOSIS — E11311 Type 2 diabetes mellitus with unspecified diabetic retinopathy with macular edema: Secondary | ICD-10-CM | POA: Diagnosis not present

## 2017-06-11 DIAGNOSIS — I10 Essential (primary) hypertension: Secondary | ICD-10-CM | POA: Diagnosis not present

## 2017-06-11 DIAGNOSIS — E113512 Type 2 diabetes mellitus with proliferative diabetic retinopathy with macular edema, left eye: Secondary | ICD-10-CM | POA: Diagnosis not present

## 2017-06-11 DIAGNOSIS — H43813 Vitreous degeneration, bilateral: Secondary | ICD-10-CM

## 2017-06-11 DIAGNOSIS — H35033 Hypertensive retinopathy, bilateral: Secondary | ICD-10-CM | POA: Diagnosis not present

## 2017-06-11 DIAGNOSIS — E113591 Type 2 diabetes mellitus with proliferative diabetic retinopathy without macular edema, right eye: Secondary | ICD-10-CM

## 2017-06-15 DIAGNOSIS — E1165 Type 2 diabetes mellitus with hyperglycemia: Secondary | ICD-10-CM | POA: Diagnosis not present

## 2017-06-15 DIAGNOSIS — E7800 Pure hypercholesterolemia, unspecified: Secondary | ICD-10-CM | POA: Diagnosis not present

## 2017-06-15 DIAGNOSIS — I10 Essential (primary) hypertension: Secondary | ICD-10-CM | POA: Diagnosis not present

## 2017-06-15 DIAGNOSIS — R609 Edema, unspecified: Secondary | ICD-10-CM | POA: Diagnosis not present

## 2017-07-30 ENCOUNTER — Other Ambulatory Visit: Payer: PPO | Admitting: Internal Medicine

## 2017-07-30 DIAGNOSIS — I10 Essential (primary) hypertension: Secondary | ICD-10-CM

## 2017-07-30 DIAGNOSIS — E119 Type 2 diabetes mellitus without complications: Secondary | ICD-10-CM

## 2017-07-30 DIAGNOSIS — IMO0001 Reserved for inherently not codable concepts without codable children: Secondary | ICD-10-CM

## 2017-07-30 DIAGNOSIS — N401 Enlarged prostate with lower urinary tract symptoms: Secondary | ICD-10-CM | POA: Diagnosis not present

## 2017-07-30 DIAGNOSIS — E785 Hyperlipidemia, unspecified: Secondary | ICD-10-CM | POA: Diagnosis not present

## 2017-07-30 DIAGNOSIS — R413 Other amnesia: Secondary | ICD-10-CM

## 2017-07-30 DIAGNOSIS — Z0000 Encounter for general adult medical examination without abnormal findings: Secondary | ICD-10-CM

## 2017-07-30 DIAGNOSIS — Z794 Long term (current) use of insulin: Secondary | ICD-10-CM

## 2017-07-31 LAB — COMPLETE METABOLIC PANEL WITH GFR
AG RATIO: 1.4 (calc) (ref 1.0–2.5)
ALBUMIN MSPROF: 3.6 g/dL (ref 3.6–5.1)
ALKALINE PHOSPHATASE (APISO): 83 U/L (ref 40–115)
ALT: 17 U/L (ref 9–46)
AST: 15 U/L (ref 10–35)
BILIRUBIN TOTAL: 0.6 mg/dL (ref 0.2–1.2)
BUN/Creatinine Ratio: 14 (calc) (ref 6–22)
BUN: 16 mg/dL (ref 7–25)
CHLORIDE: 102 mmol/L (ref 98–110)
CO2: 31 mmol/L (ref 20–32)
Calcium: 8.6 mg/dL (ref 8.6–10.3)
Creat: 1.15 mg/dL — ABNORMAL HIGH (ref 0.70–1.11)
GFR, Est African American: 68 mL/min/{1.73_m2} (ref >=60)
GFR, Est Non African American: 59 mL/min/{1.73_m2} — ABNORMAL LOW (ref >=60)
GLUCOSE: 196 mg/dL — AB (ref 65–99)
Globulin: 2.5 g/dL (calc) (ref 1.9–3.7)
POTASSIUM: 4.3 mmol/L (ref 3.5–5.3)
SODIUM: 139 mmol/L (ref 135–146)
TOTAL PROTEIN: 6.1 g/dL (ref 6.1–8.1)

## 2017-07-31 LAB — CBC WITH DIFFERENTIAL/PLATELET
BASOS ABS: 42 {cells}/uL (ref 0–200)
Basophils Relative: 0.6 %
EOS PCT: 3 %
Eosinophils Absolute: 210 cells/uL (ref 15–500)
HCT: 46.6 % (ref 38.5–50.0)
HEMOGLOBIN: 15.9 g/dL (ref 13.2–17.1)
LYMPHS ABS: 1463 {cells}/uL (ref 850–3900)
MCH: 32.4 pg (ref 27.0–33.0)
MCHC: 34.1 g/dL (ref 32.0–36.0)
MCV: 94.9 fL (ref 80.0–100.0)
MPV: 10.7 fL (ref 7.5–12.5)
Monocytes Relative: 7.5 %
Neutro Abs: 4760 cells/uL (ref 1500–7800)
Neutrophils Relative %: 68 %
Platelets: 113 10*3/uL — ABNORMAL LOW (ref 140–400)
RBC: 4.91 10*6/uL (ref 4.20–5.80)
RDW: 12.5 % (ref 11.0–15.0)
Total Lymphocyte: 20.9 %
WBC: 7 10*3/uL (ref 3.8–10.8)
WBCMIX: 525 {cells}/uL (ref 200–950)

## 2017-07-31 LAB — HEMOGLOBIN A1C
EAG (MMOL/L): 9.3 (calc)
Hgb A1c MFr Bld: 7.5 % of total Hgb — ABNORMAL HIGH (ref <5.7)
MEAN PLASMA GLUCOSE: 169 (calc)

## 2017-07-31 LAB — PSA: PSA: 1 ng/mL (ref <=4.0)

## 2017-07-31 LAB — LIPID PANEL
CHOLESTEROL: 140 mg/dL (ref <200)
HDL: 43 mg/dL (ref >40)
LDL Cholesterol (Calc): 77 mg/dL (calc)
Non-HDL Cholesterol (Calc): 97 mg/dL (calc) (ref <130)
TRIGLYCERIDES: 121 mg/dL (ref <150)
Total CHOL/HDL Ratio: 3.3 (calc) (ref <5.0)

## 2017-07-31 LAB — MICROALBUMIN / CREATININE URINE RATIO
CREATININE, URINE: 105 mg/dL (ref 20–320)
MICROALB UR: 0.2 mg/dL
MICROALB/CREAT RATIO: 2 ug/mg{creat} (ref <30)

## 2017-08-03 ENCOUNTER — Telehealth: Payer: Self-pay

## 2017-08-03 ENCOUNTER — Ambulatory Visit: Payer: PPO | Admitting: Internal Medicine

## 2017-08-03 ENCOUNTER — Encounter: Payer: Self-pay | Admitting: Internal Medicine

## 2017-08-03 VITALS — BP 100/60 | HR 66 | Temp 98.7°F | Ht 64.0 in | Wt 198.0 lb

## 2017-08-03 DIAGNOSIS — I519 Heart disease, unspecified: Secondary | ICD-10-CM

## 2017-08-03 DIAGNOSIS — N401 Enlarged prostate with lower urinary tract symptoms: Secondary | ICD-10-CM

## 2017-08-03 DIAGNOSIS — Z9889 Other specified postprocedural states: Secondary | ICD-10-CM

## 2017-08-03 DIAGNOSIS — E538 Deficiency of other specified B group vitamins: Secondary | ICD-10-CM | POA: Diagnosis not present

## 2017-08-03 DIAGNOSIS — R269 Unspecified abnormalities of gait and mobility: Secondary | ICD-10-CM

## 2017-08-03 DIAGNOSIS — G250 Essential tremor: Secondary | ICD-10-CM

## 2017-08-03 DIAGNOSIS — R351 Nocturia: Secondary | ICD-10-CM

## 2017-08-03 DIAGNOSIS — Z0000 Encounter for general adult medical examination without abnormal findings: Secondary | ICD-10-CM | POA: Diagnosis not present

## 2017-08-03 DIAGNOSIS — E7849 Other hyperlipidemia: Secondary | ICD-10-CM

## 2017-08-03 DIAGNOSIS — G8929 Other chronic pain: Secondary | ICD-10-CM

## 2017-08-03 DIAGNOSIS — M545 Low back pain: Secondary | ICD-10-CM | POA: Diagnosis not present

## 2017-08-03 DIAGNOSIS — R413 Other amnesia: Secondary | ICD-10-CM

## 2017-08-03 DIAGNOSIS — I10 Essential (primary) hypertension: Secondary | ICD-10-CM

## 2017-08-03 DIAGNOSIS — H903 Sensorineural hearing loss, bilateral: Secondary | ICD-10-CM | POA: Diagnosis not present

## 2017-08-03 DIAGNOSIS — E7800 Pure hypercholesterolemia, unspecified: Secondary | ICD-10-CM

## 2017-08-03 LAB — POCT URINALYSIS DIPSTICK
Bilirubin, UA: NEGATIVE
Blood, UA: NEGATIVE
KETONES UA: NEGATIVE
LEUKOCYTES UA: NEGATIVE
Nitrite, UA: NEGATIVE
PROTEIN UA: NEGATIVE
Spec Grav, UA: 1.025 (ref 1.010–1.025)
Urobilinogen, UA: 0.2 E.U./dL
pH, UA: 6.5 (ref 5.0–8.0)

## 2017-08-03 NOTE — Telephone Encounter (Signed)
Pt was referred to [facility] and his appt is 10/16/17 10 am with Dr Elvera Lennox. Also referred to [facility] appt is 09/16/17 at 10:15 am with Dr Macdirarmidt. Wrote out both appts for the patient and faxing notes to [facility] in regards to his appointment,

## 2017-08-17 ENCOUNTER — Ambulatory Visit: Payer: PPO | Admitting: Podiatry

## 2017-08-17 ENCOUNTER — Encounter: Payer: Self-pay | Admitting: Podiatry

## 2017-08-17 DIAGNOSIS — B351 Tinea unguium: Secondary | ICD-10-CM

## 2017-08-17 DIAGNOSIS — E1151 Type 2 diabetes mellitus with diabetic peripheral angiopathy without gangrene: Secondary | ICD-10-CM

## 2017-08-17 DIAGNOSIS — E1142 Type 2 diabetes mellitus with diabetic polyneuropathy: Secondary | ICD-10-CM | POA: Diagnosis not present

## 2017-08-17 DIAGNOSIS — M79674 Pain in right toe(s): Secondary | ICD-10-CM | POA: Diagnosis not present

## 2017-08-17 DIAGNOSIS — M79675 Pain in left toe(s): Secondary | ICD-10-CM

## 2017-08-17 NOTE — Patient Instructions (Signed)
Removed bandage in the second right toe in 1-3 days and continue to apply topical antibiotic ointment and Band-Aid daily until a scab forms  Diabetes and Foot Care Diabetes may cause you to have problems because of poor blood supply (circulation) to your feet and legs. This may cause the skin on your feet to become thinner, break easier, and heal more slowly. Your skin may become dry, and the skin may peel and crack. You may also have nerve damage in your legs and feet causing decreased feeling in them. You may not notice minor injuries to your feet that could lead to infections or more serious problems. Taking care of your feet is one of the most important things you can do for yourself. Follow these instructions at home:  Wear shoes at all times, even in the house. Do not go barefoot. Bare feet are easily injured.  Check your feet daily for blisters, cuts, and redness. If you cannot see the bottom of your feet, use a mirror or ask someone for help.  Wash your feet with warm water (do not use hot water) and mild soap. Then pat your feet and the areas between your toes until they are completely dry. Do not soak your feet as this can dry your skin.  Apply a moisturizing lotion or petroleum jelly (that does not contain alcohol and is unscented) to the skin on your feet and to dry, brittle toenails. Do not apply lotion between your toes.  Trim your toenails straight across. Do not dig under them or around the cuticle. File the edges of your nails with an emery board or nail file.  Do not cut corns or calluses or try to remove them with medicine.  Wear clean socks or stockings every day. Make sure they are not too tight. Do not wear knee-high stockings since they may decrease blood flow to your legs.  Wear shoes that fit properly and have enough cushioning. To break in new shoes, wear them for just a few hours a day. This prevents you from injuring your feet. Always look in your shoes before you put  them on to be sure there are no objects inside.  Do not cross your legs. This may decrease the blood flow to your feet.  If you find a minor scrape, cut, or break in the skin on your feet, keep it and the skin around it clean and dry. These areas may be cleansed with mild soap and water. Do not cleanse the area with peroxide, alcohol, or iodine.  When you remove an adhesive bandage, be sure not to damage the skin around it.  If you have a wound, look at it several times a day to make sure it is healing.  Do not use heating pads or hot water bottles. They may burn your skin. If you have lost feeling in your feet or legs, you may not know it is happening until it is too late.  Make sure your health care provider performs a complete foot exam at least annually or more often if you have foot problems. Report any cuts, sores, or bruises to your health care provider immediately. Contact a health care provider if:  You have an injury that is not healing.  You have cuts or breaks in the skin.  You have an ingrown nail.  You notice redness on your legs or feet.  You feel burning or tingling in your legs or feet.  You have pain or cramps in your  legs and feet.  Your legs or feet are numb.  Your feet always feel cold. Get help right away if:  There is increasing redness, swelling, or pain in or around a wound.  There is a red line that goes up your leg.  Pus is coming from a wound.  You develop a fever or as directed by your health care provider.  You notice a bad smell coming from an ulcer or wound. This information is not intended to replace advice given to you by your health care provider. Make sure you discuss any questions you have with your health care provider. Document Released: 09/05/2000 Document Revised: 02/14/2016 Document Reviewed: 02/15/2013 Elsevier Interactive Patient Education  2017 [employer].

## 2017-08-17 NOTE — Progress Notes (Signed)
Patient ID: Joseph Mora, male   DOB: Sep 29, 1932, 83 y.o.   MRN: [id number]    Subjective: Patient presents today with his wife and son present to treatment room complaining of thickened and elongated toenails over the past 5 years which have gradually become more thickened and deformed over time making shoe wearing walking uncomfortable and is requesting toenail debridement.  Patient is a diabetic and denies history of foot ulceration, claudication or amputation  Patient hard of hearing and appears orientated 3 Objective: Vascular: Bilateral peripheral pitting edema DP pulses 0/4 bilaterally PT pulses 0/4 bilaterally  Neurological: Sensation to 10 g monofilament wire intact 3/5 right 2/5 left Vibratory sensation nonreactive right reactive left Ankle reflex equal and reactive bilaterally  Dermatological: No open skin lesions bilaterally Atrophic skin,absent hair growth bilaterally Dry scaling skin bilaterally The toenails are elongated, brittle, deformed and tender direct palpation 6-10  Musculoskeletal: No deformities noted bilaterally There is no restriction ankle, subtalar, midtarsal joints bilaterally  Assessment: Diabetic peripheral arterial disease Diabetic peripheral neuropathy symptomatic onychomycoses 6-10  Plan: The toenails 6-10 were debrided mechanically and electrically slight bleeding distal second right toe, treated with topical antibiotic ointment and Band-Aid. Patient instructed removed Band-Aid 1-3 days and continue applying topical antibiotic ointment and Band-Aid daily until a scab forms  Reappoint as 3 months

## 2017-08-20 NOTE — Progress Notes (Signed)
Subjective:    Patient ID: Joseph Mora, male    DOB: Sep 17, 1933, 83 y.o.   MRN: [id number]  HPI 83 year old male in today for Medicare wellness, health maintenance.  History of insulin-dependent diabetes mellitus, memory loss, hypertension, hyperlipidemia, coronary disease.  History of adenomatous colon polyps, BPH, chronic back pain.  Has nocturia.  In June 10, 1989 he had PTCA with 100% occluded circumflex artery.  He had a cardiac catheterization with stent placement to the right coronary artery September 2000.  Last stress test was in 2014 and was negative.  Had negative ABIs bilaterally October 2017.  In September 2016 he had a 2D echocardiogram showing normal left atrial size and no valvular issues.  He had grade 1 diastolic dysfunction.  Left ventricular size was normal.  Ejection fraction estimated to be 60-65%.  Trileaflet aortic valve.  Also had Doppler studies October 2017 of the lower extremities negative for DVT.  History of screening colonoscopy by Dr. Collene Mares in 2004  Right inguinal hernia repair 1953, lumbar disc surgery L4-L5 and L5-S1 1998.  Cataract extraction right eye 2006.  Surgery for chronic left ulnar neuropathy due to diabetes in the left carpal tunnel release done by Dr. Suszanne Conners March 2012.  Surgery for parathyroid adenoma September 2011.  In 1999 he was hospitalized for back surgery, he developed a GI bleed requiring emergency surgery with gastroduodenostomy and pyloroplasty.  He also had a truncal vagotomy.  A large bleeding duodenal ulcer was detected and that was when H. pylori was diagnosed.  Treatment smoking in 1990.  Does not consume alcohol.  Social history: Married.  Has adult children and has raised a number of foster children over the years.  He is retired Associate Professor for [employer].  Sister died of stroke at age 65 Father died at age 67 struck by motor vehicle.  Mother died at age 71 with history of diabetes, stroke,  Alzheimer's disease.  One brother died at age 42 with history of colon cancer alcoholism and myocardial infarction.   One brother with adult onset diabetes.  3 sisters with diabetes mellitus.  Sister died of stroke at age 61. Was admitted to the hospital September 2016 after being bitten by fire ants resulting in acute encephalopathy angioedema and acute kidney injury.  He had PVCs during that hospitalization and cardiologist was called to see him.  Lopressor was recommended.  After that time he began to have some memory issues.  He sees Dr. Jannifer Franklin for memory disorder and a central tremor and is on Aricept.  He has been diagnosed with essential tremor memory disorder and mild gait disorder.  Episode of profound hypoglycemia requiring admission January 2017.  History of CMV infection 1997 which he initially thought was a reaction to Pneumovax and had H. pylori gastritis March 1999.    Review of Systems  Respiratory: Negative.   Cardiovascular: Negative.   Gastrointestinal: Negative.   Genitourinary:       Nocturia and some urinary incontinence  Psychiatric/Behavioral:       Memory disturbance       Objective:   Physical Exam  Constitutional: He is oriented to person, place, and time. He appears well-developed and well-nourished.  HENT:  Head: Normocephalic and atraumatic.  Mouth/Throat: Oropharynx is clear and moist.  Eyes: Conjunctivae and EOM are normal. Pupils are equal, round, and reactive to light. Left eye exhibits no discharge.  Neck: Neck supple. No JVD present. No thyromegaly present.  Cardiovascular: Normal rate, regular rhythm  and normal heart sounds.  No murmur heard. Pulmonary/Chest: He has no wheezes. He has no rales.  Abdominal: Soft. Bowel sounds are normal. He exhibits no distension. There is no tenderness. There is no rebound and no guarding.  Genitourinary:  Genitourinary Comments: Prostate enlarged without nodules  Musculoskeletal: He exhibits no edema.    Lymphadenopathy:    He has no cervical adenopathy.  Neurological: He is alert and oriented to person, place, and time. He has normal reflexes.  Skin: Skin is warm and dry.  Psychiatric: He has a normal mood and affect. His behavior is normal. Judgment and thought content normal.  Vitals reviewed.         Assessment & Plan:  Insulin-dependent diabetes mellitus  BPH   Memory loss followed by neurologist  Hyperlipidemia  Tremor followed by neurologist  Chronic back pain  Coronary artery disease followed by cardiology  History of adenomatous colon polyps  Hypertension  Status post surgery for left parathyroid adenoma 2011  History of vagotomy and pyloroplasty due to gastric ulcer  History of H. pylori  Plan: Continue same medications and return in 6 months.  His memory is failing and he is not as agile as he used to be.  No longer drives.  Subjective:   Patient presents for Medicare Annual/Subsequent preventive examination.  Review Past Medical/Family/Social:   Risk Factors  Current exercise habits:  Dietary issues discussed:   Cardiac risk factors:  Depression Screen  (Note: if answer to either of the following is "Yes", a more complete depression screening is indicated)   Over the past two weeks, have you felt down, depressed or hopeless? No  Over the past two weeks, have you felt little interest or pleasure in doing things?  Yes somewhat Have you lost interest or pleasure in daily life? No Do you often feel hopeless? No Do you cry easily over simple problems? No   Activities of Daily Living  In your present state of health, do you have any difficulty performing the following activities?:   Driving?  does not drive anymore Managing money?  Wife manages money Feeding yourself? No  Getting from bed to chair?  He has gait disturbance and somewhat feeble Climbing a flight of stairs?  He has gait disturbance Preparing food and eating?: No  Bathing or  showering? No  Getting dressed: No  Getting to the toilet? No  Using the toilet:No  Moving around from place to place: Yes due to gait disturbance and back pain In the past year have you fallen or had a near fall?:No  Are you sexually active? No  Do you have more than one partner? No   Hearing Difficulties: No  Do you often ask people to speak up or repeat themselves?  Yes do you experience ringing or noises in your ears? No  Do you have difficulty understanding soft or whispered voices?  Yes Do you feel that you have a problem with memory?  Sometimes Do you often misplace items?  He has   Home Safety:  Do you have a smoke alarm at your residence? Yes Do you have grab bars in the bathroom?  No Do you have throw rugs in your house? No  Cognitive Testing  Alert? Yes Normal Appearance?Yes  Oriented to person? Yes Place? Yes  Time? Yes  Recall of three objects?  Not tested deferred to neurology Can perform simple calculations?  Not tested Displays appropriate judgment?Yes  Can read the correct time from a watch face?Yes  List the Names of Other Physician/Practitioners you currently use:  See referral list for the physicians patient is currently seeing.     Review of Systems: See above   Objective:     General appearance: Appears stated age and frail Head: Normocephalic, without obvious abnormality, atraumatic  Eyes: conj clear, EOMi PEERLA  Ears: normal TM's and external ear canals both ears  Nose: Nares normal. Septum midline. Mucosa normal. No drainage or sinus tenderness.  Throat: lips, mucosa, and tongue normal; teeth and gums normal  Neck: no adenopathy, no carotid bruit, no JVD, supple, symmetrical, trachea midline and thyroid not enlarged, symmetric, no tenderness/mass/nodules  No CVA tenderness.  Lungs: clear to auscultation bilaterally  Breasts: normal appearance, no masses or tenderness,   Heart: regular rate and rhythm, S1, S2 normal, no murmur, click, rub  or gallop   Abdomen: soft, non-tender; bowel sounds normal; no masses, no organomegaly  Musculoskeletal: ROM normal in all joints, no crepitus, no deformity, Normal muscle strengthen. Back  is symmetric, no curvature. Skin: Skin color, texture, turgor normal. No rashes or lesions  Lymph nodes: Cervical, supraclavicular, and axillary nodes normal.  Neurologic: CN 2 -12 Normal, Normal symmetric reflexes. Normal coordination and gait  Psych: Alert & Oriented x 3, Mood appear stable.    Assessment:    Annual wellness medicare exam   Plan:    During the course of the visit the patient was educated and counseled about appropriate screening and preventive services including:   Annual flu vaccine     Patient Instructions (the written plan) was given to the patient.  Medicare Attestation  I have personally reviewed:  The patient's medical and social history  Their use of alcohol, tobacco or illicit drugs  Their current medications and supplements  The patient's functional ability including ADLs,fall risks, home safety risks, cognitive, and hearing and visual impairment  Diet and physical activities  Evidence for depression or mood disorders  The patient's weight, height, BMI, and visual acuity have been recorded in the chart. I have made referrals, counseling, and provided education to the patient based on review of the above and I have provided the patient with a written personalized care plan for preventive services.

## 2017-09-11 ENCOUNTER — Other Ambulatory Visit: Payer: Self-pay | Admitting: Internal Medicine

## 2017-10-08 DIAGNOSIS — R351 Nocturia: Secondary | ICD-10-CM | POA: Diagnosis not present

## 2017-10-08 DIAGNOSIS — N3941 Urge incontinence: Secondary | ICD-10-CM | POA: Diagnosis not present

## 2017-10-08 DIAGNOSIS — R350 Frequency of micturition: Secondary | ICD-10-CM | POA: Diagnosis not present

## 2017-10-15 DIAGNOSIS — E1165 Type 2 diabetes mellitus with hyperglycemia: Secondary | ICD-10-CM | POA: Diagnosis not present

## 2017-10-15 DIAGNOSIS — I10 Essential (primary) hypertension: Secondary | ICD-10-CM | POA: Diagnosis not present

## 2017-10-15 DIAGNOSIS — E7800 Pure hypercholesterolemia, unspecified: Secondary | ICD-10-CM | POA: Diagnosis not present

## 2017-10-16 DIAGNOSIS — D2339 Other benign neoplasm of skin of other parts of face: Secondary | ICD-10-CM | POA: Diagnosis not present

## 2017-10-16 DIAGNOSIS — D485 Neoplasm of uncertain behavior of skin: Secondary | ICD-10-CM | POA: Diagnosis not present

## 2017-10-25 NOTE — Progress Notes (Signed)
Chief Complaint  Patient presents with  . Coronary Artery Disease   History of Present Illness: 84 yo male with history of CAD, DM, HLD, HTN, DJD here today for cardiac follow up. Cardiac history includes stent placement in the Circumflex artery in 1990 and stent placement in the RCA in 2000. Most recent stress test in July 2014 with normal LV function, no obvious ischemia. He was admitted to the hospital in September 2016 after being bitten by many fire ants resulting in acute encephalopathy, angioedema and acute kidney injury. He was noted to have PVCs during this hospitalization and cardiology consultation recommended starting Lopressor. Echo Sept 2016 with normal LV systolic function. Normal ABI in 2017.   He is here today for follow up. The patient denies any chest pain, dyspnea, palpitations, lower extremity edema, orthopnea, PND, dizziness, near syncope or syncope.   Primary Care Physician: Elby Showers, MD  Past Medical History:  Diagnosis Date  . Arthritis    RA  . CAD (coronary artery disease)   . Diabetes mellitus   . Hyperlipidemia   . Memory difficulty 06/22/2015  . Myocardial infarction ([place])   . Tremor 06/22/2015   Intentional, right hand    Past Surgical History:  Procedure Laterality Date  . BACK SURGERY    . CARPAL TUNNEL RELEASE Bilateral   . CATARACT EXTRACTION Bilateral   . INGUINAL HERNIA REPAIR    . PARATHYROIDECTOMY      Current Outpatient Medications  Medication Sig Dispense Refill  . aspirin 81 MG tablet Take 81 mg by mouth daily.    Marland Kitchen atorvastatin (LIPITOR) 40 MG tablet TAKE 1 TABLET BY MOUTH DAILY 90 tablet 3  . B-D UF III MINI PEN NEEDLES 31G X 5 MM MISC     . Blood Glucose Monitoring Suppl (ONE TOUCH ULTRA SYSTEM KIT) w/Device KIT 1 kit by Does not apply route once. 1 each 0  . donepezil (ARICEPT) 10 MG tablet TAKE 1 TABLET(10 MG) BY MOUTH AT BEDTIME 90 tablet 3  . doxazosin (CARDURA) 2 MG tablet TAKE 1 TABLET BY MOUTH EVERY NIGHT AT BEDTIME 90  tablet 2  . glucose blood test strip Use as instructed 100 each 12  . insulin NPH-regular Human (NOVOLIN 70/30) (70-30) 100 UNIT/ML injection Inject into the skin as directed. 60u in the morning, 10u at lunch, 25u in the evening    . memantine (NAMENDA) 10 MG tablet Take 1 tablet (10 mg total) by mouth 2 (two) times daily. 60 tablet 11  . metoprolol tartrate (LOPRESSOR) 50 MG tablet TAKE 1 TABLET BY MOUTH TWICE DAILY 180 tablet 3  . mirabegron ER (MYRBETRIQ) 50 MG TB24 tablet Take 50 mg by mouth daily.    . nitroGLYCERIN (NITROSTAT) 0.4 MG SL tablet Place 1 tablet (0.4 mg total) under the tongue every 5 (five) minutes as needed for chest pain. 25 tablet 6  . tamsulosin (FLOMAX) 0.4 MG CAPS capsule TAKE 1 CAPSULE BY MOUTH EVERY DAY 90 capsule 3  . vitamin B-12 100 MCG tablet Take 1 tablet (100 mcg total) by mouth daily. 30 tablet 0  . lisinopril (PRINIVIL,ZESTRIL) 5 MG tablet Take 1 tablet (5 mg total) by mouth daily. 90 tablet 3   No current facility-administered medications for this visit.     Allergies  Allergen Reactions  . Morphine And Related Itching  . Penicillins Hives    Social History   Socioeconomic History  . Marital status: Married    Spouse name: [name]  . Number  of children: 4  . Years of education: GED  . Highest education level: Not on file  Social Needs  . Financial resource strain: Not on file  . Food insecurity - worry: Not on file  . Food insecurity - inability: Not on file  . Transportation needs - medical: Not on file  . Transportation needs - non-medical: Not on file  Occupational History  . Occupation: retired  Tobacco Use  . Smoking status: Former Smoker    Last attempt to quit: 08/03/2010    Years since quitting: 7.2  . Smokeless tobacco: Current User    Types: Chew  Substance and Sexual Activity  . Alcohol use: No  . Drug use: No  . Sexual activity: Not on file  Other Topics Concern  . Not on file  Social History Narrative   Patient drinks 3  cups of caffeine daily.   Patient is right handed.     Family History  Problem Relation Age of Onset  . Heart attack Paternal Aunt   . Stroke Mother   . Diabetes Mother   . Dementia Mother   . Diabetes Sister   . Stroke Sister   . Dementia Sister   . Diabetes Brother   . Dementia Brother   . Hypertension Neg Hx     Review of Systems:  As stated in the HPI and otherwise negative.   BP (!) 90/52   Pulse 74   Ht 5' 4"  (1.626 m)   Wt 195 lb (88.5 kg)   SpO2 96%   BMI 33.47 kg/m   Physical Examination:  General: Well developed, well nourished, NAD  HEENT: OP clear, mucus membranes moist  SKIN: warm, dry. No rashes. Neuro: No focal deficits  Musculoskeletal: Muscle strength 5/5 all ext  Psychiatric: Mood and affect normal  Neck: No JVD, no carotid bruits, no thyromegaly, no lymphadenopathy.  Lungs:Clear bilaterally, no wheezes, rhonci, crackles Cardiovascular: Regular rate and rhythm. No murmurs, gallops or rubs. Abdomen:Soft. Bowel sounds present. Non-tender.  Extremities: No lower extremity edema. Pulses are 2 + in the bilateral DP/PT.  EKG:  EKG is  ordered today. The ekg ordered today demonstrates Sinus with 1st degree AV block. PVCs. RBBB  Recent Labs: 07/30/2017: ALT 17; BUN 16; Creat 1.15; Hemoglobin 15.9; Platelets 113; Potassium 4.3; Sodium 139   Lipid Panel    Component Value Date/Time   CHOL 140 07/30/2017 1053   TRIG 121 07/30/2017 1053   HDL 43 07/30/2017 1053   CHOLHDL 3.3 07/30/2017 1053   VLDL 20 01/27/2017 1128   LDLCALC 58 01/27/2017 1128     Wt Readings from Last 3 Encounters:  10/26/17 195 lb (88.5 kg)  08/03/17 198 lb (89.8 kg)  05/20/17 198 lb 12.8 oz (90.2 kg)     Other studies Reviewed: Additional studies/ records that were reviewed today include: . Review of the above records demonstrates:    Assessment and Plan:   1. CAD without angina:  No chest pain suggestive of angina. Will continue ASA, statin, beta blocker and  Ace-inh.  2. HLD: Continue statin. LDL at goal.  3. Tobacco abuse, in remission: He stopped smoking in 2010  4. PVCs: Noted on EKG again todayWill continue beta blocker. No palpitations.   5. Leg pain: Felt to be from neuropathy. Normal ABI in 2017.    6. Hypotension: BP is soft today. Will lower Lisinopril down to 5 mg daily.   Current medicines are reviewed at length with the patient today.  The patient  does not have concerns regarding medicines.  The following changes have been made:  no change  Labs/ tests ordered today include:   Orders Placed This Encounter  Procedures  . EKG 12-Lead    Disposition:   FU with me in 12  months  Signed, Lauree Chandler, MD 10/26/2017 11:15 AM    [facility] [address], [place], [place]  [address] Phone: [phone]; Fax: [phone]

## 2017-10-26 ENCOUNTER — Encounter: Payer: Self-pay | Admitting: Cardiovascular Disease

## 2017-10-26 ENCOUNTER — Ambulatory Visit: Payer: PPO | Admitting: Cardiovascular Disease

## 2017-10-26 VITALS — BP 90/52 | HR 74 | Ht 64.0 in | Wt 195.0 lb

## 2017-10-26 DIAGNOSIS — F17201 Nicotine dependence, unspecified, in remission: Secondary | ICD-10-CM | POA: Diagnosis not present

## 2017-10-26 DIAGNOSIS — E7800 Pure hypercholesterolemia, unspecified: Secondary | ICD-10-CM | POA: Diagnosis not present

## 2017-10-26 DIAGNOSIS — I493 Ventricular premature depolarization: Secondary | ICD-10-CM | POA: Diagnosis not present

## 2017-10-26 DIAGNOSIS — I2510 Atherosclerotic heart disease of native coronary artery without angina pectoris: Secondary | ICD-10-CM

## 2017-10-26 MED ORDER — LISINOPRIL 5 MG PO TABS: 5.0000 mg | ORAL_TABLET | Freq: Every day | ORAL | 3 refills | Status: DC

## 2017-10-26 NOTE — Patient Instructions (Signed)
Medication Instructions:  Your physician has recommended you make the following change in your medication:  Decrease lisinopril to 5 mg by mouth daily.   Labwork: none  Testing/Procedures: none  Follow-Up: Your physician recommends that you schedule a follow-up appointment in: 12 months. Please call our office in about 9 months to schedule this appointment    Any Other Special Instructions Will Be Listed Below (If Applicable).     If you need a refill on your cardiac medications before your next appointment, please call your pharmacy.

## 2017-11-16 ENCOUNTER — Encounter: Payer: Self-pay | Admitting: Podiatry

## 2017-11-16 ENCOUNTER — Ambulatory Visit: Payer: PPO | Admitting: Podiatry

## 2017-11-16 DIAGNOSIS — M79675 Pain in left toe(s): Secondary | ICD-10-CM

## 2017-11-16 DIAGNOSIS — M79674 Pain in right toe(s): Secondary | ICD-10-CM | POA: Diagnosis not present

## 2017-11-16 DIAGNOSIS — B351 Tinea unguium: Secondary | ICD-10-CM | POA: Diagnosis not present

## 2017-11-16 NOTE — Progress Notes (Signed)
Subjective:   Patient ID: Joseph Mora, male   DOB: 84 y.o.   MRN: [id number]   HPI Patient presents with thick incurvated nailbeds 1-5 both feet that are painful and he cannot cut   ROS      Objective:  Physical Exam  Neurovascular status intact thick yellow brittle nailbeds 1-5 both feet     Assessment:  Mycotic painful nailbeds 1-5 both feet that are impossible for him to cut     Plan:  Debride painful nailbeds 1-5 both feet with no iatrogenic bleeding noted

## 2017-11-16 NOTE — Patient Instructions (Signed)

## 2017-11-19 ENCOUNTER — Encounter: Payer: Self-pay | Admitting: Adult Health

## 2017-11-19 ENCOUNTER — Ambulatory Visit (INDEPENDENT_AMBULATORY_CARE_PROVIDER_SITE_OTHER): Payer: PPO | Admitting: Adult Health

## 2017-11-19 VITALS — BP 129/74 | HR 67 | Ht 64.0 in | Wt 196.8 lb

## 2017-11-19 DIAGNOSIS — R413 Other amnesia: Secondary | ICD-10-CM

## 2017-11-19 MED ORDER — DONEPEZIL HCL 10 MG PO TABS: ORAL_TABLET | ORAL | 3 refills | Status: DC

## 2017-11-19 MED ORDER — MEMANTINE HCL 10 MG PO TABS: 10.0000 mg | ORAL_TABLET | Freq: Two times a day (BID) | ORAL | 11 refills | Status: DC

## 2017-11-19 NOTE — Patient Instructions (Signed)
Your Plan:  Continue Aricept and Namenda Memory score has declined- we will continue to monitor If your symptoms worsen or you develop new symptoms please let us know.   Thank you for coming to see [place] at [facility]. I hope we have been able to provide you high quality care today.  You may receive a patient satisfaction survey over the next few weeks. We would appreciate your feedback and comments so that we may continue to improve ourselves and the health of our patients.

## 2017-11-19 NOTE — Progress Notes (Signed)
I have read the note, and I agree with the clinical assessment and plan.  Charles K Willis   

## 2017-11-19 NOTE — Progress Notes (Signed)
PATIENT: Joseph Mora DOB: 1933-02-25  REASON FOR VISIT: follow up HISTORY FROM: patient  HISTORY OF PRESENT ILLNESS: Today 11/19/17 Joseph Mora is an 84 year old male with a history of memory disturbance. He returns today for follow-up.  Patient is currently on Aricept and Namenda.  He continues to tolerate the medication well.  He does require assistance with ADLs.  His wife states that because he does not want to do it.  She reports that he has a hard time using the telephone and TV remote.  Reports good appetite.  Denies any trouble sleeping.  Denies hallucinations.  He does not operate a motor vehicle.  He returns today for evaluation.  HISTORY 05/20/17 Joseph Mora is an 84 year old male with a history of memory disturbance and tremor. He returns today for follow-up. The patient is on Aricept and Namenda. Reports that since we started Namenda he has tolerated it well. Patient does require assistance with ADLs however this is due to back pain. The patient does have difficulty ambulating due to ongoing back pain. States he can't  ambulate long distances without resting. He does not operate a motor vehicle. Reports a good appetite. Patient denies any changes in his mood or behavior per wife states that he has grouchy at times. Denies hallucinations. He returns today for an evaluation.  REVIEW OF SYSTEMS: Out of a complete 14 system review of symptoms, the patient complains only of the following symptoms, and all other reviewed systems are negative.  See HPI  ALLERGIES: Allergies  Allergen Reactions  . Morphine And Related Itching  . Penicillins Hives    HOME MEDICATIONS: Outpatient Medications Prior to Visit  Medication Sig Dispense Refill  . aspirin 81 MG tablet Take 81 mg by mouth daily.    Marland Kitchen atorvastatin (LIPITOR) 40 MG tablet TAKE 1 TABLET BY MOUTH DAILY 90 tablet 3  . B-D UF III MINI PEN NEEDLES 31G X 5 MM MISC     . Blood Glucose Monitoring Suppl (ONE TOUCH ULTRA SYSTEM KIT)  w/Device KIT 1 kit by Does not apply route once. 1 each 0  . donepezil (ARICEPT) 10 MG tablet TAKE 1 TABLET(10 MG) BY MOUTH AT BEDTIME 90 tablet 3  . doxazosin (CARDURA) 2 MG tablet TAKE 1 TABLET BY MOUTH EVERY NIGHT AT BEDTIME 90 tablet 2  . glucose blood test strip Use as instructed 100 each 12  . insulin NPH-regular Human (NOVOLIN 70/30) (70-30) 100 UNIT/ML injection Inject into the skin as directed. 60u in the morning, 10u at lunch, 25u in the evening    . lisinopril (PRINIVIL,ZESTRIL) 5 MG tablet Take 1 tablet (5 mg total) by mouth daily. 90 tablet 3  . memantine (NAMENDA) 10 MG tablet Take 1 tablet (10 mg total) by mouth 2 (two) times daily. 60 tablet 11  . metoprolol tartrate (LOPRESSOR) 50 MG tablet TAKE 1 TABLET BY MOUTH TWICE DAILY 180 tablet 3  . mirabegron ER (MYRBETRIQ) 50 MG TB24 tablet Take 50 mg by mouth daily.    . nitroGLYCERIN (NITROSTAT) 0.4 MG SL tablet Place 1 tablet (0.4 mg total) under the tongue every 5 (five) minutes as needed for chest pain. 25 tablet 6  . tamsulosin (FLOMAX) 0.4 MG CAPS capsule TAKE 1 CAPSULE BY MOUTH EVERY DAY 90 capsule 3  . vitamin B-12 100 MCG tablet Take 1 tablet (100 mcg total) by mouth daily. 30 tablet 0   No facility-administered medications prior to visit.     PAST MEDICAL HISTORY: Past Medical History:  Diagnosis Date  . Arthritis    RA  . CAD (coronary artery disease)   . Diabetes mellitus   . Hyperlipidemia   . Memory difficulty 06/22/2015  . Myocardial infarction ([place])   . Tremor 06/22/2015   Intentional, right hand    PAST SURGICAL HISTORY: Past Surgical History:  Procedure Laterality Date  . BACK SURGERY    . CARPAL TUNNEL RELEASE Bilateral   . CATARACT EXTRACTION Bilateral   . INGUINAL HERNIA REPAIR    . PARATHYROIDECTOMY      FAMILY HISTORY: Family History  Problem Relation Age of Onset  . Heart attack Paternal Aunt   . Stroke Mother   . Diabetes Mother   . Dementia Mother   . Diabetes Sister   . Stroke Sister    . Dementia Sister   . Diabetes Brother   . Dementia Brother   . Hypertension Neg Hx     SOCIAL HISTORY: Social History   Socioeconomic History  . Marital status: Married    Spouse name: [name]  . Number of children: 4  . Years of education: GED  . Highest education level: Not on file  Social Needs  . Financial resource strain: Not on file  . Food insecurity - worry: Not on file  . Food insecurity - inability: Not on file  . Transportation needs - medical: Not on file  . Transportation needs - non-medical: Not on file  Occupational History  . Occupation: retired  Tobacco Use  . Smoking status: Former Smoker    Last attempt to quit: 08/03/2010    Years since quitting: 7.3  . Smokeless tobacco: Current User    Types: Chew  Substance and Sexual Activity  . Alcohol use: No  . Drug use: No  . Sexual activity: Not on file  Other Topics Concern  . Not on file  Social History Narrative   Patient drinks 3 cups of caffeine daily.   Patient is right handed.       PHYSICAL EXAM  Vitals:   11/19/17 0758  BP: 129/74  Pulse: 67  Weight: 196 lb 12.8 oz (89.3 kg)  Height: 5' 4"  (1.626 m)   Body mass index is 33.47 kg/m.   MMSE - Mini Mental State Exam 11/19/2017 05/20/2017 11/26/2016  Orientation to time 0 2 1  Orientation to Place 3 4 5   Registration 3 3 3   Attention/ Calculation 0 0 0  Recall 2 1 3   Recall-comments - - -  Language- name 2 objects 2 2 2   Language- repeat 0 1 0  Language- follow 3 step command 3 3 3   Language- read & follow direction 1 1 1   Write a sentence 0 1 0  Write a sentence-comments - - -  Copy design 0 0 0  Total score 14 18 18      Generalized: Well developed, in no acute distress   Neurological examination  Mentation: Alert.. Follows all commands speech and language fluent Cranial nerve II-XII: Pupils were equal round reactive to light. Extraocular movements were full, visual field were full on confrontational test. Facial sensation and  strength were normal. Uvula tongue midline. Head turning and shoulder shrug  were normal and symmetric. Motor: The motor testing reveals 5 over 5 strength of all 4 extremities. Good symmetric motor tone is noted throughout.  Sensory: Sensory testing is intact to soft touch on all 4 extremities. No evidence of extinction is noted.  Coordination: Cerebellar testing reveals good finger-nose-finger and heel-to-shin bilaterally.  Gait  and station: Patient uses a cane when ambulating.  Gait is slightly unsteady.  Tandem gait not attempted. Reflexes: Deep tendon reflexes are symmetric and normal bilaterally.   DIAGNOSTIC DATA (LABS, IMAGING, TESTING) - I reviewed patient records, labs, notes, testing and imaging myself where available.  Lab Results  Component Value Date   WBC 7.0 07/30/2017   HGB 15.9 07/30/2017   HCT 46.6 07/30/2017   MCV 94.9 07/30/2017   PLT 113 (L) 07/30/2017      Component Value Date/Time   NA 139 07/30/2017 1053   K 4.3 07/30/2017 1053   CL 102 07/30/2017 1053   CO2 31 07/30/2017 1053   GLUCOSE 196 (H) 07/30/2017 1053   BUN 16 07/30/2017 1053   CREATININE 1.15 (H) 07/30/2017 1053   CALCIUM 8.6 07/30/2017 1053   PROT 6.1 07/30/2017 1053   ALBUMIN 3.7 01/27/2017 1128   AST 15 07/30/2017 1053   ALT 17 07/30/2017 1053   ALKPHOS 77 01/27/2017 1128   BILITOT 0.6 07/30/2017 1053   GFRNONAA 59 (L) 07/30/2017 1053   GFRAA 68 07/30/2017 1053   Lab Results  Component Value Date   CHOL 140 07/30/2017   HDL 43 07/30/2017   LDLCALC 58 01/27/2017   TRIG 121 07/30/2017   CHOLHDL 3.3 07/30/2017   Lab Results  Component Value Date   HGBA1C 7.5 (H) 07/30/2017   Lab Results  Component Value Date   VITAMINB12 701 01/27/2017   Lab Results  Component Value Date   TSH 1.75 10/16/2016      ASSESSMENT AND PLAN 84 y.o. year old male  has a past medical history of Arthritis, CAD (coronary artery disease), Diabetes mellitus, Hyperlipidemia, Memory difficulty  (06/22/2015), Myocardial infarction ([place]), and Tremor (06/22/2015). here with:  1.  Memory disturbance  The patient's memory score has declined slightly.  He will continue on Aricept and Namenda.  I have advised that if his symptoms worsen or he develops new symptoms he should let us know.  He will follow-up in 6 months or sooner if needed.  I spent 15 minutes with the patient. 50% of this time was spent reviewing the patient's memory score     Ward Givens, MSN, NP-C 11/19/2017, 8:04 AM [facility] [address] [place], [place] [address] [phone]

## 2017-12-01 DIAGNOSIS — R351 Nocturia: Secondary | ICD-10-CM | POA: Diagnosis not present

## 2017-12-01 DIAGNOSIS — N3941 Urge incontinence: Secondary | ICD-10-CM | POA: Diagnosis not present

## 2017-12-09 ENCOUNTER — Encounter (INDEPENDENT_AMBULATORY_CARE_PROVIDER_SITE_OTHER): Payer: PPO | Admitting: Ophthalmology

## 2017-12-09 DIAGNOSIS — H43813 Vitreous degeneration, bilateral: Secondary | ICD-10-CM | POA: Diagnosis not present

## 2017-12-09 DIAGNOSIS — E11319 Type 2 diabetes mellitus with unspecified diabetic retinopathy without macular edema: Secondary | ICD-10-CM

## 2017-12-09 DIAGNOSIS — H35033 Hypertensive retinopathy, bilateral: Secondary | ICD-10-CM | POA: Diagnosis not present

## 2017-12-09 DIAGNOSIS — H35372 Puckering of macula, left eye: Secondary | ICD-10-CM

## 2017-12-09 DIAGNOSIS — I10 Essential (primary) hypertension: Secondary | ICD-10-CM | POA: Diagnosis not present

## 2017-12-09 DIAGNOSIS — E113593 Type 2 diabetes mellitus with proliferative diabetic retinopathy without macular edema, bilateral: Secondary | ICD-10-CM

## 2017-12-09 LAB — HM DIABETES EYE EXAM

## 2017-12-10 ENCOUNTER — Encounter: Payer: Self-pay | Admitting: Internal Medicine

## 2017-12-15 ENCOUNTER — Other Ambulatory Visit: Payer: Self-pay | Admitting: Internal Medicine

## 2017-12-29 DIAGNOSIS — Z961 Presence of intraocular lens: Secondary | ICD-10-CM | POA: Diagnosis not present

## 2018-01-20 ENCOUNTER — Other Ambulatory Visit: Payer: Self-pay | Admitting: Internal Medicine

## 2018-01-20 DIAGNOSIS — I10 Essential (primary) hypertension: Secondary | ICD-10-CM

## 2018-01-20 DIAGNOSIS — Z794 Long term (current) use of insulin: Secondary | ICD-10-CM

## 2018-01-20 DIAGNOSIS — IMO0001 Reserved for inherently not codable concepts without codable children: Secondary | ICD-10-CM

## 2018-01-20 DIAGNOSIS — E785 Hyperlipidemia, unspecified: Secondary | ICD-10-CM

## 2018-01-20 DIAGNOSIS — E119 Type 2 diabetes mellitus without complications: Secondary | ICD-10-CM

## 2018-01-28 ENCOUNTER — Other Ambulatory Visit: Payer: PPO | Admitting: Internal Medicine

## 2018-01-28 DIAGNOSIS — I10 Essential (primary) hypertension: Secondary | ICD-10-CM | POA: Diagnosis not present

## 2018-01-28 DIAGNOSIS — Z794 Long term (current) use of insulin: Principal | ICD-10-CM

## 2018-01-28 DIAGNOSIS — E119 Type 2 diabetes mellitus without complications: Principal | ICD-10-CM

## 2018-01-28 DIAGNOSIS — E785 Hyperlipidemia, unspecified: Secondary | ICD-10-CM

## 2018-01-28 DIAGNOSIS — IMO0001 Reserved for inherently not codable concepts without codable children: Secondary | ICD-10-CM

## 2018-01-29 LAB — HEPATIC FUNCTION PANEL
AG RATIO: 1.4 (calc) (ref 1.0–2.5)
ALKALINE PHOSPHATASE (APISO): 110 U/L (ref 40–115)
ALT: 25 U/L (ref 9–46)
AST: 18 U/L (ref 10–35)
Albumin: 3.6 g/dL (ref 3.6–5.1)
Bilirubin, Direct: 0.2 mg/dL (ref 0.0–0.2)
GLOBULIN: 2.6 g/dL (ref 1.9–3.7)
Indirect Bilirubin: 0.6 mg/dL (calc) (ref 0.2–1.2)
TOTAL PROTEIN: 6.2 g/dL (ref 6.1–8.1)
Total Bilirubin: 0.8 mg/dL (ref 0.2–1.2)

## 2018-01-29 LAB — MICROALBUMIN / CREATININE URINE RATIO
CREATININE, URINE: 60 mg/dL (ref 20–320)
Microalb Creat Ratio: 12 mcg/mg creat (ref <30)
Microalb, Ur: 0.7 mg/dL

## 2018-01-29 LAB — HEMOGLOBIN A1C
EAG (MMOL/L): 13.5 (calc)
Hgb A1c MFr Bld: 10.1 % of total Hgb — ABNORMAL HIGH (ref <5.7)
Mean Plasma Glucose: 243 (calc)

## 2018-01-29 LAB — LIPID PANEL
Cholesterol: 137 mg/dL (ref <200)
HDL: 44 mg/dL (ref >40)
LDL CHOLESTEROL (CALC): 76 mg/dL
Non-HDL Cholesterol (Calc): 93 mg/dL (calc) (ref <130)
Total CHOL/HDL Ratio: 3.1 (calc) (ref <5.0)
Triglycerides: 87 mg/dL (ref <150)

## 2018-02-01 ENCOUNTER — Telehealth: Payer: Self-pay | Admitting: Internal Medicine

## 2018-02-01 ENCOUNTER — Ambulatory Visit (INDEPENDENT_AMBULATORY_CARE_PROVIDER_SITE_OTHER): Payer: PPO | Admitting: Internal Medicine

## 2018-02-01 ENCOUNTER — Encounter: Payer: Self-pay | Admitting: Internal Medicine

## 2018-02-01 VITALS — BP 90/78 | HR 64 | Temp 97.9°F | Ht 64.0 in | Wt 184.0 lb

## 2018-02-01 DIAGNOSIS — E538 Deficiency of other specified B group vitamins: Secondary | ICD-10-CM | POA: Diagnosis not present

## 2018-02-01 DIAGNOSIS — G8929 Other chronic pain: Secondary | ICD-10-CM | POA: Diagnosis not present

## 2018-02-01 DIAGNOSIS — Z794 Long term (current) use of insulin: Secondary | ICD-10-CM | POA: Diagnosis not present

## 2018-02-01 DIAGNOSIS — IMO0001 Reserved for inherently not codable concepts without codable children: Secondary | ICD-10-CM

## 2018-02-01 DIAGNOSIS — E119 Type 2 diabetes mellitus without complications: Secondary | ICD-10-CM

## 2018-02-01 DIAGNOSIS — I519 Heart disease, unspecified: Secondary | ICD-10-CM

## 2018-02-01 DIAGNOSIS — E1165 Type 2 diabetes mellitus with hyperglycemia: Secondary | ICD-10-CM

## 2018-02-01 DIAGNOSIS — M545 Low back pain, unspecified: Secondary | ICD-10-CM

## 2018-02-01 DIAGNOSIS — H903 Sensorineural hearing loss, bilateral: Secondary | ICD-10-CM

## 2018-02-01 DIAGNOSIS — R413 Other amnesia: Secondary | ICD-10-CM | POA: Diagnosis not present

## 2018-02-01 DIAGNOSIS — E785 Hyperlipidemia, unspecified: Secondary | ICD-10-CM | POA: Diagnosis not present

## 2018-02-01 DIAGNOSIS — I10 Essential (primary) hypertension: Secondary | ICD-10-CM

## 2018-02-01 DIAGNOSIS — Z9889 Other specified postprocedural states: Secondary | ICD-10-CM | POA: Diagnosis not present

## 2018-02-01 MED ORDER — SERTRALINE HCL 50 MG PO TABS: 50.0000 mg | ORAL_TABLET | Freq: Every day | ORAL | 3 refills | Status: DC

## 2018-02-01 NOTE — Telephone Encounter (Signed)
Caren Griffins called back to say that she had spoke with Dr Sherwood Gambler and it is alright for Cordarrell to have MRI

## 2018-02-01 NOTE — Patient Instructions (Signed)
To have MRI of the LS-spine in the near future.  Diabetes is poorly controlled and he needs to start walking some.  He refuses to go to physical therapy.  Wife is been told not to wait on him but to encourage him to do things for himself.  He is being started on Zoloft 50 mg daily for possible depression.
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
## 2018-02-01 NOTE — Telephone Encounter (Signed)
Called and spoke with Joseph Mora at Dr Donnella Bi office to see if it is okay for Joseph Mora to have MRI since he had Back surgery in 1999. ([place]). She stated she would get with the doctor and have someone call us back.

## 2018-02-02 ENCOUNTER — Ambulatory Visit
Admission: RE | Admit: 2018-02-02 | Discharge: 2018-02-02 | Disposition: A | Discharge status: Home / Self Care | Payer: PPO | Source: Ambulatory Visit | Attending: Internal Medicine | Admitting: Internal Medicine

## 2018-02-02 DIAGNOSIS — M48061 Spinal stenosis, lumbar region without neurogenic claudication: Secondary | ICD-10-CM | POA: Diagnosis not present

## 2018-02-03 ENCOUNTER — Telehealth: Payer: Self-pay | Admitting: Internal Medicine

## 2018-02-03 NOTE — Telephone Encounter (Addendum)
Faxed referral request, Office notes, Demographics, Insurance Card, MRI Results, prior surgery note

## 2018-02-04 NOTE — Telephone Encounter (Signed)
LVM for Joseph Mora at Dr Donnella Bi office to [facility] and schedule an appointment

## 2018-02-09 NOTE — Telephone Encounter (Signed)
Spoke with Joseph Mora and they are aware of appointment with Dr Sherwood Gambler on 03/23/18 @ 11:30am

## 2018-02-09 NOTE — Telephone Encounter (Signed)
Received fax stating that [facility] has an appointment with Dr Sherwood Gambler for 03/23/18 @11 :30am

## 2018-02-10 DIAGNOSIS — R350 Frequency of micturition: Secondary | ICD-10-CM | POA: Diagnosis not present

## 2018-02-10 DIAGNOSIS — R351 Nocturia: Secondary | ICD-10-CM | POA: Diagnosis not present

## 2018-02-10 DIAGNOSIS — N3941 Urge incontinence: Secondary | ICD-10-CM | POA: Diagnosis not present

## 2018-02-16 ENCOUNTER — Other Ambulatory Visit: Payer: PPO

## 2018-02-19 ENCOUNTER — Ambulatory Visit: Payer: PPO | Admitting: Podiatry

## 2018-02-19 ENCOUNTER — Encounter: Payer: Self-pay | Admitting: Podiatry

## 2018-02-19 DIAGNOSIS — M79675 Pain in left toe(s): Secondary | ICD-10-CM

## 2018-02-19 DIAGNOSIS — B351 Tinea unguium: Secondary | ICD-10-CM

## 2018-02-19 DIAGNOSIS — E1151 Type 2 diabetes mellitus with diabetic peripheral angiopathy without gangrene: Secondary | ICD-10-CM | POA: Diagnosis not present

## 2018-02-19 DIAGNOSIS — E7800 Pure hypercholesterolemia, unspecified: Secondary | ICD-10-CM | POA: Diagnosis not present

## 2018-02-19 DIAGNOSIS — L600 Ingrowing nail: Secondary | ICD-10-CM

## 2018-02-19 DIAGNOSIS — I10 Essential (primary) hypertension: Secondary | ICD-10-CM | POA: Diagnosis not present

## 2018-02-19 DIAGNOSIS — E1142 Type 2 diabetes mellitus with diabetic polyneuropathy: Secondary | ICD-10-CM

## 2018-02-19 DIAGNOSIS — E1165 Type 2 diabetes mellitus with hyperglycemia: Secondary | ICD-10-CM | POA: Diagnosis not present

## 2018-02-20 NOTE — Progress Notes (Signed)
Subjective: Patient presents today with diabetes, diabetic neuropathy and cc of painful, discolored, thick toenails which interfere with activities of daily living which require ambulation.  Pain is aggravated when wearing enclosed shoe gear. Pain is getting progressively worse and relieved with periodic professional debridement. Today, he presents with his wife and son. He is reporting left great toe pain due to ingrown toenail left great toe lateral border. He denies any drainage, swelling or redness. He denies any attempt at treatment.  Patient is a diabetic and denies history of foot ulceration, claudication or amputation   Objective:  Last HgA1C: 10.1 (01/28/18)  Vascular Examination: Capillary refill time <3 seconds x 10 digits Dorsalis pedis pulses absent b/l Posterior tibial pulses absent b/l No digital hair x 10 digits Skin temperature warm to cool b/l  Dermatological Examination: Skin thin, shiny and atrophic b/l Toenails 1-5 b/l discolored, thick, dystrophic with subungual debris and pain with palpation to nailbeds due to thickness of nails. No open skin lesions bilaterally Atrophic skin,absent hair growth bilaterally   Musculoskeletal: Muscle strength 5/5 to all LE muscle groups No deformities noted bilaterally There is no restriction ankle, subtalar, midtarsal joints bilaterally  Neurological: Sensation to 10 g monofilament wire intact 3/5 right 2/5 left Vibratory sensation nonreactive right;  reactive left  Assessment: 1. Painful onychomycosis toenails 1-5 b/l 2. NIDDM with Peripheral arterial disease 3. Diabetic neuropathy  Plan: 1. Continue diabetic foot care principles. 2. Toenails 1-5 b/l were debrided in length and girth without iatrogenic bleeding. Offending nail border debrided and curretaged left great toe. Triple antibiotic ointment applied.  No further treatment required.  3. Patient to continue soft, supportive shoe gear 4. Patient to report any pedal  injuries to medical professional  5. Follow up 3 months. Patient/POA to call should there be a concern in the interim.

## 2018-02-24 ENCOUNTER — Observation Stay (HOSPITAL_COMMUNITY)
Admission: EM | Admit: 2018-02-24 | Discharge: 2018-02-25 | Disposition: A | Discharge status: Skilled Nursing Facility | Payer: PPO | Attending: Internal Medicine | Admitting: Internal Medicine

## 2018-02-24 ENCOUNTER — Telehealth: Payer: Self-pay

## 2018-02-24 ENCOUNTER — Emergency Department (HOSPITAL_COMMUNITY): Payer: PPO

## 2018-02-24 ENCOUNTER — Encounter (HOSPITAL_COMMUNITY): Payer: Self-pay | Admitting: Physician Assistant

## 2018-02-24 DIAGNOSIS — Z79899 Other long term (current) drug therapy: Secondary | ICD-10-CM | POA: Insufficient documentation

## 2018-02-24 DIAGNOSIS — I2510 Atherosclerotic heart disease of native coronary artery without angina pectoris: Secondary | ICD-10-CM | POA: Diagnosis present

## 2018-02-24 DIAGNOSIS — I10 Essential (primary) hypertension: Secondary | ICD-10-CM | POA: Diagnosis present

## 2018-02-24 DIAGNOSIS — E860 Dehydration: Secondary | ICD-10-CM | POA: Diagnosis not present

## 2018-02-24 DIAGNOSIS — Z885 Allergy status to narcotic agent status: Secondary | ICD-10-CM | POA: Insufficient documentation

## 2018-02-24 DIAGNOSIS — Z87891 Personal history of nicotine dependence: Secondary | ICD-10-CM | POA: Insufficient documentation

## 2018-02-24 DIAGNOSIS — Z7982 Long term (current) use of aspirin: Secondary | ICD-10-CM | POA: Diagnosis not present

## 2018-02-24 DIAGNOSIS — G8929 Other chronic pain: Secondary | ICD-10-CM | POA: Diagnosis present

## 2018-02-24 DIAGNOSIS — F0390 Unspecified dementia without behavioral disturbance: Secondary | ICD-10-CM | POA: Insufficient documentation

## 2018-02-24 DIAGNOSIS — R531 Weakness: Secondary | ICD-10-CM | POA: Diagnosis not present

## 2018-02-24 DIAGNOSIS — IMO0001 Reserved for inherently not codable concepts without codable children: Secondary | ICD-10-CM

## 2018-02-24 DIAGNOSIS — R413 Other amnesia: Secondary | ICD-10-CM | POA: Diagnosis present

## 2018-02-24 DIAGNOSIS — Z880 Allergy status to penicillin: Secondary | ICD-10-CM | POA: Diagnosis not present

## 2018-02-24 DIAGNOSIS — R2681 Unsteadiness on feet: Secondary | ICD-10-CM | POA: Insufficient documentation

## 2018-02-24 DIAGNOSIS — Z66 Do not resuscitate: Secondary | ICD-10-CM | POA: Diagnosis not present

## 2018-02-24 DIAGNOSIS — I252 Old myocardial infarction: Secondary | ICD-10-CM | POA: Insufficient documentation

## 2018-02-24 DIAGNOSIS — E785 Hyperlipidemia, unspecified: Secondary | ICD-10-CM | POA: Diagnosis not present

## 2018-02-24 DIAGNOSIS — M48061 Spinal stenosis, lumbar region without neurogenic claudication: Secondary | ICD-10-CM | POA: Diagnosis not present

## 2018-02-24 DIAGNOSIS — M4056 Lordosis, unspecified, lumbar region: Secondary | ICD-10-CM | POA: Insufficient documentation

## 2018-02-24 DIAGNOSIS — Z794 Long term (current) use of insulin: Secondary | ICD-10-CM | POA: Insufficient documentation

## 2018-02-24 DIAGNOSIS — J9811 Atelectasis: Secondary | ICD-10-CM | POA: Diagnosis not present

## 2018-02-24 DIAGNOSIS — E1165 Type 2 diabetes mellitus with hyperglycemia: Secondary | ICD-10-CM | POA: Diagnosis not present

## 2018-02-24 DIAGNOSIS — M549 Dorsalgia, unspecified: Secondary | ICD-10-CM

## 2018-02-24 DIAGNOSIS — N400 Enlarged prostate without lower urinary tract symptoms: Secondary | ICD-10-CM | POA: Diagnosis not present

## 2018-02-24 DIAGNOSIS — M5136 Other intervertebral disc degeneration, lumbar region: Secondary | ICD-10-CM | POA: Diagnosis not present

## 2018-02-24 DIAGNOSIS — R4020 Unspecified coma: Secondary | ICD-10-CM | POA: Diagnosis not present

## 2018-02-24 DIAGNOSIS — E119 Type 2 diabetes mellitus without complications: Secondary | ICD-10-CM

## 2018-02-24 LAB — URINALYSIS, ROUTINE W REFLEX MICROSCOPIC
BILIRUBIN URINE: NEGATIVE
Glucose, UA: 50 mg/dL — AB
Hgb urine dipstick: NEGATIVE
KETONES UR: 20 mg/dL — AB
Leukocytes, UA: NEGATIVE
NITRITE: NEGATIVE
PROTEIN: NEGATIVE mg/dL
Specific Gravity, Urine: 1.024 (ref 1.005–1.030)
pH: 5 (ref 5.0–8.0)

## 2018-02-24 LAB — I-STAT CHEM 8, ED
BUN: 28 mg/dL — ABNORMAL HIGH (ref 6–20)
CREATININE: 1.1 mg/dL (ref 0.61–1.24)
Calcium, Ion: 1.06 mmol/L — ABNORMAL LOW (ref 1.15–1.40)
Chloride: 102 mmol/L (ref 101–111)
Glucose, Bld: 179 mg/dL — ABNORMAL HIGH (ref 65–99)
HEMATOCRIT: 47 % (ref 39.0–52.0)
HEMOGLOBIN: 16 g/dL (ref 13.0–17.0)
POTASSIUM: 4.3 mmol/L (ref 3.5–5.1)
Sodium: 139 mmol/L (ref 135–145)
TCO2: 24 mmol/L (ref 22–32)

## 2018-02-24 LAB — RAPID URINE DRUG SCREEN, HOSP PERFORMED
Amphetamines: NOT DETECTED
Barbiturates: NOT DETECTED
Benzodiazepines: NOT DETECTED
Cocaine: NOT DETECTED
Opiates: NOT DETECTED
Tetrahydrocannabinol: NOT DETECTED

## 2018-02-24 LAB — COMPREHENSIVE METABOLIC PANEL
ALBUMIN: 3.6 g/dL (ref 3.5–5.0)
ALT: 30 U/L (ref 17–63)
AST: 29 U/L (ref 15–41)
Alkaline Phosphatase: 103 U/L (ref 38–126)
Anion gap: 13 (ref 5–15)
BUN: 22 mg/dL — ABNORMAL HIGH (ref 6–20)
CHLORIDE: 100 mmol/L — AB (ref 101–111)
CO2: 23 mmol/L (ref 22–32)
Calcium: 8.9 mg/dL (ref 8.9–10.3)
Creatinine, Ser: 1.42 mg/dL — ABNORMAL HIGH (ref 0.61–1.24)
GFR calc Af Amer: 51 mL/min — ABNORMAL LOW (ref >60)
GFR, EST NON AFRICAN AMERICAN: 44 mL/min — AB (ref >60)
GLUCOSE: 181 mg/dL — AB (ref 65–99)
POTASSIUM: 4 mmol/L (ref 3.5–5.1)
SODIUM: 136 mmol/L (ref 135–145)
Total Bilirubin: 1.2 mg/dL (ref 0.3–1.2)
Total Protein: 7 g/dL (ref 6.5–8.1)

## 2018-02-24 LAB — DIFFERENTIAL
Abs Immature Granulocytes: 0 10*3/uL (ref 0.0–0.1)
BASOS ABS: 0 10*3/uL (ref 0.0–0.1)
BASOS PCT: 0 %
EOS ABS: 0 10*3/uL (ref 0.0–0.7)
Eosinophils Relative: 0 %
IMMATURE GRANULOCYTES: 0 %
Lymphocytes Relative: 9 %
Lymphs Abs: 1 10*3/uL (ref 0.7–4.0)
Monocytes Absolute: 0.7 10*3/uL (ref 0.1–1.0)
Monocytes Relative: 7 %
NEUTROS PCT: 84 %
Neutro Abs: 9.2 10*3/uL — ABNORMAL HIGH (ref 1.7–7.7)

## 2018-02-24 LAB — ETHANOL

## 2018-02-24 LAB — I-STAT TROPONIN, ED: Troponin i, poc: 0.01 ng/mL (ref 0.00–0.08)

## 2018-02-24 LAB — CBC
HCT: 48 % (ref 39.0–52.0)
Hemoglobin: 15.7 g/dL (ref 13.0–17.0)
MCH: 30.8 pg (ref 26.0–34.0)
MCHC: 32.7 g/dL (ref 30.0–36.0)
MCV: 94.3 fL (ref 78.0–100.0)
PLATELETS: 145 10*3/uL — AB (ref 150–400)
RBC: 5.09 MIL/uL (ref 4.22–5.81)
RDW: 12.8 % (ref 11.5–15.5)
WBC: 11.1 10*3/uL — AB (ref 4.0–10.5)

## 2018-02-24 LAB — I-STAT CG4 LACTIC ACID, ED
Lactic Acid, Venous: 3.87 mmol/L (ref 0.5–1.9)
Lactic Acid, Venous: 0.86 mmol/L (ref 0.5–1.9)

## 2018-02-24 LAB — PROTIME-INR
INR: 1.11
PROTHROMBIN TIME: 14.2 s (ref 11.4–15.2)

## 2018-02-24 LAB — APTT: aPTT: 29 seconds (ref 24–36)

## 2018-02-24 LAB — CBG MONITORING, ED: Glucose-Capillary: 168 mg/dL — ABNORMAL HIGH (ref 65–99)

## 2018-02-24 LAB — GLUCOSE, CAPILLARY
Glucose-Capillary: 120 mg/dL — ABNORMAL HIGH (ref 65–99)
Glucose-Capillary: 105 mg/dL — ABNORMAL HIGH (ref 65–99)

## 2018-02-24 MED ORDER — SODIUM CHLORIDE 0.9 % IV BOLUS
1000.0000 mL | Freq: Once | INTRAVENOUS | Status: AC
  Administered 2018-02-24: 1000 mL via INTRAVENOUS

## 2018-02-24 MED ORDER — INSULIN ASPART PROT & ASPART (70-30 MIX) 100 UNIT/ML ~~LOC~~ SUSP
50.0000 [IU] | Freq: Every day | SUBCUTANEOUS | Status: DC
  Administered 2018-02-25: 50 [IU] via SUBCUTANEOUS
  Filled 2018-02-24 (×3): qty 10

## 2018-02-24 MED ORDER — INSULIN ASPART PROT & ASPART (70-30 MIX) 100 UNIT/ML ~~LOC~~ SUSP
30.0000 [IU] | Freq: Every day | SUBCUTANEOUS | Status: DC
  Administered 2018-02-24: 15 [IU] via SUBCUTANEOUS
  Administered 2018-02-25: 30 [IU] via SUBCUTANEOUS

## 2018-02-24 MED ORDER — INSULIN ASPART 100 UNIT/ML ~~LOC~~ SOLN
0.0000 [IU] | Freq: Three times a day (TID) | SUBCUTANEOUS | Status: DC
  Administered 2018-02-25: 3 [IU] via SUBCUTANEOUS

## 2018-02-24 MED ORDER — LACTATED RINGERS IV SOLN
INTRAVENOUS | Status: DC
  Administered 2018-02-24 – 2018-02-25 (×2): via INTRAVENOUS

## 2018-02-24 MED ORDER — ENOXAPARIN SODIUM 40 MG/0.4ML ~~LOC~~ SOLN
40.0000 mg | SUBCUTANEOUS | Status: DC
  Administered 2018-02-24 – 2018-02-25 (×2): 40 mg via SUBCUTANEOUS
  Filled 2018-02-24 (×2): qty 0.4

## 2018-02-24 MED ORDER — MEMANTINE HCL 10 MG PO TABS
10.0000 mg | ORAL_TABLET | Freq: Two times a day (BID) | ORAL | Status: DC
  Administered 2018-02-24 – 2018-02-25 (×3): 10 mg via ORAL
  Filled 2018-02-24 (×3): qty 1

## 2018-02-24 MED ORDER — ACETAMINOPHEN 325 MG PO TABS: 650.0000 mg | ORAL_TABLET | Freq: Four times a day (QID) | ORAL | Status: DC | PRN

## 2018-02-24 MED ORDER — MIRABEGRON ER 50 MG PO TB24
50.0000 mg | ORAL_TABLET | Freq: Every day | ORAL | Status: DC
  Administered 2018-02-25: 50 mg via ORAL
  Filled 2018-02-24: qty 1

## 2018-02-24 MED ORDER — ASPIRIN 81 MG PO CHEW
81.0000 mg | CHEWABLE_TABLET | Freq: Every day | ORAL | Status: DC
  Administered 2018-02-25: 81 mg via ORAL
  Filled 2018-02-24: qty 1

## 2018-02-24 MED ORDER — ACETAMINOPHEN 650 MG RE SUPP: 650.0000 mg | Freq: Four times a day (QID) | RECTAL | Status: DC | PRN

## 2018-02-24 MED ORDER — ATORVASTATIN CALCIUM 20 MG PO TABS
40.0000 mg | ORAL_TABLET | Freq: Every day | ORAL | Status: DC
  Administered 2018-02-25: 40 mg via ORAL
  Filled 2018-02-24: qty 2

## 2018-02-24 MED ORDER — LISINOPRIL 10 MG PO TABS
5.0000 mg | ORAL_TABLET | Freq: Every day | ORAL | Status: DC
  Administered 2018-02-25: 5 mg via ORAL
  Filled 2018-02-24 (×2): qty 1

## 2018-02-24 MED ORDER — INSULIN ASPART 100 UNIT/ML ~~LOC~~ SOLN: 0.0000 [IU] | Freq: Every day | SUBCUTANEOUS | Status: DC

## 2018-02-24 MED ORDER — SERTRALINE HCL 50 MG PO TABS
50.0000 mg | ORAL_TABLET | Freq: Every day | ORAL | Status: DC
  Administered 2018-02-25: 50 mg via ORAL
  Filled 2018-02-24: qty 1

## 2018-02-24 MED ORDER — TAMSULOSIN HCL 0.4 MG PO CAPS
0.4000 mg | ORAL_CAPSULE | Freq: Every day | ORAL | Status: DC
  Administered 2018-02-25: 0.4 mg via ORAL
  Filled 2018-02-24: qty 1

## 2018-02-24 MED ORDER — DONEPEZIL HCL 10 MG PO TABS
10.0000 mg | ORAL_TABLET | Freq: Every day | ORAL | Status: DC
  Administered 2018-02-24 – 2018-02-25 (×2): 10 mg via ORAL
  Filled 2018-02-24 (×2): qty 1

## 2018-02-24 MED ORDER — METOPROLOL TARTRATE 25 MG PO TABS
50.0000 mg | ORAL_TABLET | Freq: Two times a day (BID) | ORAL | Status: DC
  Administered 2018-02-24 – 2018-02-25 (×3): 50 mg via ORAL
  Filled 2018-02-24 (×3): qty 2

## 2018-02-24 MED ORDER — DOXAZOSIN MESYLATE 2 MG PO TABS
2.0000 mg | ORAL_TABLET | Freq: Every day | ORAL | Status: DC
  Administered 2018-02-24 – 2018-02-25 (×2): 2 mg via ORAL
  Filled 2018-02-24 (×2): qty 1

## 2018-02-24 NOTE — ED Notes (Signed)
Report attempted 

## 2018-02-24 NOTE — Telephone Encounter (Signed)
Called wife and advise ED visit. Needs labs and maybe CT scan

## 2018-02-24 NOTE — ED Provider Notes (Signed)
[facility] EMERGENCY DEPARTMENT Provider Note   CSN: [id number] Arrival date & time: 02/24/18  1214     History   Chief Complaint Chief Complaint  Patient presents with  . Altered Mental Status    HPI Joseph Mora is a 84 y.o. male.  HPI Patient presents with generalized weakness.  Has been altered for around the last 3 days.  Some confusion.  Difficulty getting up.  Required 3 Person assist to be able to get him into the ER.  He is diabetic sugars have been high.  Has had decreased oral intake.  Denies chest pain.  Denies recent changes in medications.  No dysuria.  Has had decreased desire to eat. Past Medical History:  Diagnosis Date  . Arthritis    RA  . CAD (coronary artery disease)   . Diabetes mellitus   . Hyperlipidemia   . Memory difficulty 06/22/2015  . Myocardial infarction ([place])   . Tremor 06/22/2015   Intentional, right hand    Patient Active Problem List   Diagnosis Date Noted  . Hypoglycemia 10/14/2015  . CAD in native artery   . Memory difficulty 06/22/2015  . Tremor 06/22/2015  . Premature ventricular contractions 06/22/2015  . Essential hypertension 05/23/2012  . Benign prostatic hyperplasia 04/21/2011  . Chronic back pain 04/21/2011  . Insulin dependent diabetes mellitus ([place]) 04/21/2011  . Hx of adenomatous colonic polyps 04/21/2011  . History of Helicobacter infection 04/21/2011  . CAD (coronary artery disease) 04/03/2011  . Hyperlipidemia 04/03/2011    Past Surgical History:  Procedure Laterality Date  . BACK SURGERY    . CARPAL TUNNEL RELEASE Bilateral   . CATARACT EXTRACTION Bilateral   . INGUINAL HERNIA REPAIR    . PARATHYROIDECTOMY          Home Medications    Prior to Admission medications   Medication Sig Start Date End Date Taking? Authorizing Provider  aspirin 81 MG tablet Take 81 mg by mouth daily. 10/01/11   Burnell Blanks, MD  atorvastatin (LIPITOR) 40 MG tablet TAKE 1 TABLET BY MOUTH DAILY  09/11/17   Elby Showers, MD  B-D UF III MINI PEN NEEDLES 31G X 5 MM MISC  01/28/16   [provider]  Blood Glucose Monitoring Suppl (ONE TOUCH ULTRA SYSTEM KIT) w/Device KIT 1 kit by Does not apply route once. 12/06/15   Elby Showers, MD  donepezil (ARICEPT) 10 MG tablet TAKE 1 TABLET(10 MG) BY MOUTH AT BEDTIME 11/19/17   Ward Givens, NP  doxazosin (CARDURA) 2 MG tablet TAKE 1 TABLET BY MOUTH EVERY NIGHT AT BEDTIME 12/15/17   Elby Showers, MD  glucose blood test strip Use as instructed 12/06/15   Elby Showers, MD  insulin NPH-regular Human (NOVOLIN 70/30) (70-30) 100 UNIT/ML injection Inject into the skin as directed. 60u in the morning, 10u at lunch, 25u in the evening    [provider]  lisinopril (PRINIVIL,ZESTRIL) 5 MG tablet Take 1 tablet (5 mg total) by mouth daily. 10/26/17 01/24/18  Burnell Blanks, MD  memantine (NAMENDA) 10 MG tablet Take 1 tablet (10 mg total) by mouth 2 (two) times daily. 11/19/17   Ward Givens, NP  metoprolol tartrate (LOPRESSOR) 50 MG tablet TAKE 1 TABLET BY MOUTH TWICE DAILY 05/15/17   Elby Showers, MD  mirabegron ER (MYRBETRIQ) 50 MG TB24 tablet Take 50 mg by mouth daily.    [provider]  nitroGLYCERIN (NITROSTAT) 0.4 MG SL tablet Place 1 tablet (  0.4 mg total) under the tongue every 5 (five) minutes as needed for chest pain. 07/31/16   Elby Showers, MD  sertraline (ZOLOFT) 50 MG tablet Take 1 tablet (50 mg total) by mouth daily. 02/01/18   Elby Showers, MD  tamsulosin (FLOMAX) 0.4 MG CAPS capsule TAKE 1 CAPSULE BY MOUTH EVERY DAY 03/24/17   Elby Showers, MD  vitamin B-12 100 MCG tablet Take 1 tablet (100 mcg total) by mouth daily. 06/01/15   Florencia Reasons, MD    Family History Family History  Problem Relation Age of Onset  . Heart attack Paternal Aunt   . Stroke Mother   . Diabetes Mother   . Dementia Mother   . Diabetes Sister   . Stroke Sister   . Dementia Sister   . Diabetes Brother   . Dementia Brother   .  Hypertension Neg Hx     Social History Social History   Tobacco Use  . Smoking status: Former Smoker    Last attempt to quit: 08/03/2010    Years since quitting: 7.5  . Smokeless tobacco: Current User    Types: Chew  Substance Use Topics  . Alcohol use: No  . Drug use: No     Allergies   Morphine and related and Penicillins   Review of Systems Review of Systems  Constitutional: Positive for appetite change and fatigue. Negative for fever.  HENT: Negative for congestion.   Respiratory: Negative for shortness of breath.   Cardiovascular: Negative for chest pain.  Gastrointestinal: Negative for abdominal pain.  Genitourinary: Negative for flank pain.  Musculoskeletal: Positive for back pain.  Skin: Negative for rash and wound.  Neurological: Negative for weakness.  Hematological: Negative for adenopathy.  Psychiatric/Behavioral: Positive for confusion.     Physical Exam Updated Vital Signs BP (!) 109/92   Pulse 80   Temp 100.1 F (37.8 C) (Rectal)   Resp 17   SpO2 97%   Physical Exam  Constitutional: He appears well-developed.  HENT:  Head: Normocephalic.  Eyes: Pupils are equal, round, and reactive to light. No scleral icterus.  Neck: Neck supple.  Cardiovascular: Normal rate.  Pulmonary/Chest: He has no wheezes. He has no rales.  Abdominal: He exhibits no distension. There is no tenderness.  Musculoskeletal: He exhibits edema.  Mild bilateral lower extremity pitting edema.  Neurological: He is alert.  He is able to raise both upper extremities up but overall does not have much strength in it.  Will not raise either leg off the bed.  Awake and still somewhat slow to answer but overall appears appropriate.  Skin: Skin is warm. Capillary refill takes less than 2 seconds.     ED Treatments / Results  Labs (all labs ordered are listed, but only abnormal results are displayed) Labs Reviewed  CBC - Abnormal; Notable for the following components:       Result Value   WBC 11.1 (*)    Platelets 145 (*)    All other components within normal limits  DIFFERENTIAL - Abnormal; Notable for the following components:   Neutro Abs 9.2 (*)    All other components within normal limits  COMPREHENSIVE METABOLIC PANEL - Abnormal; Notable for the following components:   Chloride 100 (*)    Glucose, Bld 181 (*)    BUN 22 (*)    Creatinine, Ser 1.42 (*)    GFR calc non Af Amer 44 (*)    GFR calc Af Amer 51 (*)    All  other components within normal limits  URINALYSIS, ROUTINE W REFLEX MICROSCOPIC - Abnormal; Notable for the following components:   Glucose, UA 50 (*)    Ketones, ur 20 (*)    All other components within normal limits  CBG MONITORING, ED - Abnormal; Notable for the following components:   Glucose-Capillary 168 (*)    All other components within normal limits  I-STAT CHEM 8, ED - Abnormal; Notable for the following components:   BUN 28 (*)    Glucose, Bld 179 (*)    Calcium, Ion 1.06 (*)    All other components within normal limits  I-STAT CG4 LACTIC ACID, ED - Abnormal; Notable for the following components:   Lactic Acid, Venous 3.87 (*)    All other components within normal limits  URINE CULTURE  PROTIME-INR  APTT  ETHANOL  RAPID URINE DRUG SCREEN, HOSP PERFORMED  I-STAT TROPONIN, ED  CBG MONITORING, ED  I-STAT CG4 LACTIC ACID, ED    EKG EKG Interpretation  Date/Time:  Wednesday February 24 2018 12:45:19 EDT Ventricular Rate:  80 PR Interval:    QRS Duration: 142 QT Interval:  418 QTC Calculation: 483 R Axis:   99 Text Interpretation:  Sinus rhythm RBBB and LPFB Inferior infarct, age indeterminate Abnormal lateral Q waves Confirmed by Davonna Belling [phone]) on 02/24/2018 12:53:15 PM   Radiology Ct Head Wo Contrast  Result Date: 02/24/2018 CLINICAL DATA:  Altered level of consciousness.  Memory disturbance. EXAM: CT HEAD WITHOUT CONTRAST TECHNIQUE: Contiguous axial images were obtained from the base of the skull  through the vertex without intravenous contrast. COMPARISON:  05/29/2015.  MRI 06/01/2015. FINDINGS: Brain: No sign of acute or subacute infarction. There is generalized brain atrophy. 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 Cerebral hemispheres show chronic small-vessel ischemic changes of the deep and subcortical white matter. No cortical or large vessel territory infarction. No mass lesion, hemorrhage, hydrocephalus or extra-axial collection. Vascular: There is atherosclerotic calcification of the major vessels at the base of the brain. Skull: Negative Sinuses/Orbits: Clear sinuses. Old right medial wall lamina papyracea fracture. No acute orbital finding. Other: None IMPRESSION: No acute or reversible finding. Atrophy and chronic small-vessel ischemic changes, similar to the previous exams. Electronically Signed   By: Nelson Chimes M.D.   On: 02/24/2018 13:03   Dg Chest Portable 1 View  Result Date: 02/24/2018 CLINICAL DATA:  Weakness, dizziness for 2 days. Altered mental status EXAM: PORTABLE CHEST 1 VIEW COMPARISON:  10/14/2015 FINDINGS: Bibasilar atelectasis. Low lung volumes. Heart is normal size. No effusions or acute bony abnormality. IMPRESSION: Low lung volumes with bibasilar atelectasis. Electronically Signed   By: Rolm Baptise M.D.   On: 02/24/2018 12:51    Procedures Procedures (including critical care time)  Medications Ordered in ED Medications  sodium chloride 0.9 % bolus 1,000 mL (1,000 mLs Intravenous New Bag/Given 02/24/18 1359)     Initial Impression / Assessment and Plan / ED Course  I have reviewed the triage vital signs and the nursing notes.  Pertinent labs & imaging results that were available during my care of the patient were reviewed by me and considered in my medical decision making (see chart for details).     Patient with generalized weakness.  May have a component of dehydration and with it.  Does not have a fever.  Does have renal  insufficiency urine does not show infection.  Head CT and chest x-ray reassuring.  At this point too weak to ambulate.  Not a clear infection.  Will admit to internal medicine. Initial lactic acid was elevated but has normalized upon recheck.  Final Clinical Impressions(s) / ED Diagnoses   Final diagnoses:  Weakness  Dehydration    ED Discharge Orders    None       Davonna Belling, MD 02/24/18 1601

## 2018-02-24 NOTE — ED Notes (Signed)
X-ray at bedside

## 2018-02-24 NOTE — ED Triage Notes (Signed)
Wife states pt has been altered for 3 days. Pt skin color appears jaundiced. Afebrile at triage. Weakness is generalized with no effort against gravity to any extremity. Pt poor to follow commands. Denies cough, denies pain with urination but has hx of frequent urination. Pt required 3 person assistance out of car. Pt lethargic. Wife states "his blood sugars have been high in 200/300s." 168 at triage.

## 2018-02-24 NOTE — Telephone Encounter (Signed)
Patient's wife called she would like to know if patient can be seen today? She said he's been "out of it" for a couple of days and he is getting worse, today is the first day that he got of bed he is sitting in his recliner but he is very confused, he keeps falling when ambulating to the bathroom with his walker. His glucose reading was 233 this morning before breakfast, he is not drinking much fluids. He doesn't appear to have a fever.

## 2018-02-24 NOTE — H&P (Addendum)
History and Physical    Joseph Mora DVV:[id number] DOB: Nov 30, 1932 DOA: 02/24/2018  PCP: Elby Showers, MD  Patient coming from: Home  Chief Complaint: Confusion  HPI: Joseph Mora is a 84 y.o. male with medical history significant of baseline dementia, CAD, diabetes, hyperlipidemia, arthritis who is noted to have increasing confusion and weakness over the past 2 to 3 days prior to this hospital visit.  Patient is presently mildly confused and unable to provide adequate history.  Patient's family is in room who provides majority of the history.  Per family members, patient noted to have decreased p.o. intake approximately 2 to 3 days prior to hospital visit, later associated with increased weakness with resultant three-person assist.  Family contacted PCP who subsequently referred patient to the emergency department for further work-up.  On further questioning, patient claimed to have poor p.o. intake over the past several months.  Patient otherwise denied syncope or feelings of near syncope.  She also did report decreased urine output recently.  Otherwise, patient without chest pain or shortness of breath  ED Course: In the emergency department, patient was noted to be mildly hypotensive with lactate of over 3.  Patient was given 1 L fluid bolus with repeat lactic acid being normal.  Blood pressure also improved with IV fluids.  Patient remained markedly weak and clinically dehydrated, thus hospitalist service consulted for consideration for medical admission.  Of note, patient noted to have no fevers, no leukocytosis.  CT head in the emergency department was found to be unremarkable.  Chest x-ray was found to be unremarkable.  Urinalysis was also noted to be clear.  Urine drug screen was obtained which is found to be negative.  Review of Systems:  Review of Systems  Constitutional: Negative for chills, fever, malaise/fatigue and weight loss.  HENT: Negative for nosebleeds and tinnitus.     Eyes: Negative for double vision, photophobia and pain.  Respiratory: Negative for hemoptysis, sputum production and shortness of breath.   Cardiovascular: Negative for palpitations, orthopnea and claudication.  Gastrointestinal: Negative for abdominal pain, constipation, diarrhea, nausea and vomiting.  Genitourinary: Negative for hematuria and urgency.  Musculoskeletal: Negative for back pain, joint pain and neck pain.  Skin: Negative for itching and rash.  Neurological: Positive for tremors and weakness. Negative for speech change, focal weakness, seizures, loss of consciousness and headaches.  Psychiatric/Behavioral: Negative for hallucinations and substance abuse. The patient is not nervous/anxious.     Past Medical History:  Diagnosis Date  . Arthritis    RA  . CAD (coronary artery disease)   . Diabetes mellitus   . Hyperlipidemia   . Memory difficulty 06/22/2015  . Myocardial infarction ([place])   . Tremor 06/22/2015   Intentional, right hand    Past Surgical History:  Procedure Laterality Date  . BACK SURGERY    . CARPAL TUNNEL RELEASE Bilateral   . CATARACT EXTRACTION Bilateral   . INGUINAL HERNIA REPAIR    . PARATHYROIDECTOMY       reports that he quit smoking about 7 years ago. His smokeless tobacco use includes chew. He reports that he does not drink alcohol or use drugs.  Allergies  Allergen Reactions  . Morphine And Related Itching  . Penicillins Hives    Has patient had a PCN reaction causing immediate rash, facial/tongue/throat swelling, SOB or lightheadedness with hypotension: No Has patient had a PCN reaction causing severe rash involving mucus membranes or skin necrosis: No Has patient had a PCN reaction that  required hospitalization: No Has patient had a PCN reaction occurring within the last 10 years: No If all of the above answers are "NO", then may proceed with Cephalosporin use.     Family History  Problem Relation Age of Onset  . Heart attack  Paternal Aunt   . Stroke Mother   . Diabetes Mother   . Dementia Mother   . Diabetes Sister   . Stroke Sister   . Dementia Sister   . Diabetes Brother   . Dementia Brother   . Hypertension Neg Hx     Prior to Admission medications   Medication Sig Start Date End Date Taking? Authorizing Provider  insulin NPH-regular Human (NOVOLIN 70/30) (70-30) 100 UNIT/ML injection Inject 10-50 Units into the skin See admin instructions. Inject 50 units in the morning, 10 units at lunch and 30 units in the evening, before meals.   Yes [provider]  metoprolol tartrate (LOPRESSOR) 50 MG tablet TAKE 1 TABLET BY MOUTH TWICE DAILY 05/15/17  Yes Baxley, Cresenciano Lick, MD  mirabegron ER (MYRBETRIQ) 50 MG TB24 tablet Take 50 mg by mouth daily.   Yes [provider]  nitrofurantoin (MACRODANTIN) 100 MG capsule Take 100 mg by mouth daily.   Yes [provider]  nitroGLYCERIN (NITROSTAT) 0.4 MG SL tablet Place 1 tablet (0.4 mg total) under the tongue every 5 (five) minutes as needed for chest pain. 07/31/16  Yes BaxleyCresenciano Lick, MD  aspirin 81 MG tablet Take 81 mg by mouth daily. 10/01/11   Burnell Blanks, MD  atorvastatin (LIPITOR) 40 MG tablet TAKE 1 TABLET BY MOUTH DAILY 09/11/17   Elby Showers, MD  B-D UF III MINI PEN NEEDLES 31G X 5 MM MISC  01/28/16   [provider]  Blood Glucose Monitoring Suppl (ONE TOUCH ULTRA SYSTEM KIT) w/Device KIT 1 kit by Does not apply route once. 12/06/15   Elby Showers, MD  donepezil (ARICEPT) 10 MG tablet TAKE 1 TABLET(10 MG) BY MOUTH AT BEDTIME 11/19/17   Ward Givens, NP  doxazosin (CARDURA) 2 MG tablet TAKE 1 TABLET BY MOUTH EVERY NIGHT AT BEDTIME 12/15/17   Elby Showers, MD  glucose blood test strip Use as instructed 12/06/15   Elby Showers, MD  lisinopril (PRINIVIL,ZESTRIL) 5 MG tablet Take 1 tablet (5 mg total) by mouth daily. 10/26/17 01/24/18  Burnell Blanks, MD  memantine (NAMENDA) 10 MG tablet Take 1 tablet (10 mg total)  by mouth 2 (two) times daily. 11/19/17   Ward Givens, NP  sertraline (ZOLOFT) 50 MG tablet Take 1 tablet (50 mg total) by mouth daily. 02/01/18   Elby Showers, MD  tamsulosin (FLOMAX) 0.4 MG CAPS capsule TAKE 1 CAPSULE BY MOUTH EVERY DAY 03/24/17   Elby Showers, MD  vitamin B-12 100 MCG tablet Take 1 tablet (100 mcg total) by mouth daily. 06/01/15   Florencia Reasons, MD    Physical Exam: Vitals:   02/24/18 1545 02/24/18 1600 02/24/18 1615 02/24/18 1658  BP: 135/67 126/70 128/61   Pulse: 80 83 82   Resp: _0 Temp:      TempSrc:      SpO2: 95% 97% 96%   Weight:    92.1 kg (203 lb)  Height:    5' 4.5" (1.638 m)    Constitutional: NAD, calm, comfortable Vitals:   02/24/18 1545 02/24/18 1600 02/24/18 1615 02/24/18 1658  BP: 135/67 126/70 128/61   Pulse: 80 83 82  Resp: _0 Temp:      TempSrc:      SpO2: 95% 97% 96%   Weight:    92.1 kg (203 lb)  Height:    5' 4.5" (1.638 m)   Eyes: PERRL, lids and conjunctivae normal ENMT: Mucous membranes are dry. Posterior pharynx clear of any exudate or lesions.Normal dentition.  Neck: normal, supple, no masses, no thyromegaly Respiratory: clear to auscultation bilaterally, no wheezing, no crackles. Normal respiratory effort. No accessory muscle use.  Cardiovascular: Regular rate and rhythm, no murmurs / rubs / gallops. No extremity edema. 2+ pedal pulses. No carotid bruits.  Abdomen: no tenderness, no masses palpated. No hepatosplenomegaly. Bowel sounds positive.  Musculoskeletal: no clubbing / cyanosis. No joint deformity upper and lower extremities. Good ROM, no contractures. Normal muscle tone.  Skin: no rashes, lesions, ulcers. No induration Neurologic: CN 2-12 grossly intact. Sensation intact, DTR normal. Strength 5/5 in all 4.  Psychiatric: Mildly confused, normal affect.  Patient oriented x2 (unable to provide accurate year)   Labs on Admission: I have personally reviewed following labs and imaging studies  CBC: Recent  Labs  Lab 02/24/18 1225 02/24/18 1237  WBC 11.1*  --   NEUTROABS 9.2*  --   HGB 15.7 16.0  HCT 48.0 47.0  MCV 94.3  --   PLT 145*  --    Basic Metabolic Panel: Recent Labs  Lab 02/24/18 1225 02/24/18 1237  NA 136 139  K 4.0 4.3  CL 100* 102  CO2 23  --   GLUCOSE 181* 179*  BUN 22* 28*  CREATININE 1.42* 1.10  CALCIUM 8.9  --    GFR: Estimated Creatinine Clearance: 52.6 mL/min (by C-G formula based on SCr of 1.1 mg/dL). Liver Function Tests: Recent Labs  Lab 02/24/18 1225  AST 29  ALT 30  ALKPHOS 103  BILITOT 1.2  PROT 7.0  ALBUMIN 3.6   No results for input(s): LIPASE, AMYLASE in the last 168 hours. No results for input(s): AMMONIA in the last 168 hours. Coagulation Profile: Recent Labs  Lab 02/24/18 1225  INR 1.11   Cardiac Enzymes: No results for input(s): CKTOTAL, CKMB, CKMBINDEX, TROPONINI in the last 168 hours. BNP (last 3 results) No results for input(s): PROBNP in the last 8760 hours. HbA1C: No results for input(s): HGBA1C in the last 72 hours. CBG: Recent Labs  Lab 02/24/18 1217  GLUCAP 168*   Lipid Profile: No results for input(s): CHOL, HDL, LDLCALC, TRIG, CHOLHDL, LDLDIRECT in the last 72 hours. Thyroid Function Tests: No results for input(s): TSH, T4TOTAL, FREET4, T3FREE, THYROIDAB in the last 72 hours. Anemia Panel: No results for input(s): VITAMINB12, FOLATE, FERRITIN, TIBC, IRON, RETICCTPCT in the last 72 hours. Urine analysis:    Component Value Date/Time   COLORURINE YELLOW 02/24/2018 1346   APPEARANCEUR CLEAR 02/24/2018 1346   LABSPEC 1.024 02/24/2018 1346   PHURINE 5.0 02/24/2018 1346   GLUCOSEU 50 (A) 02/24/2018 1346   HGBUR NEGATIVE 02/24/2018 1346   BILIRUBINUR NEGATIVE 02/24/2018 1346   BILIRUBINUR negative 08/03/2017 1100   KETONESUR 20 (A) 02/24/2018 1346   PROTEINUR NEGATIVE 02/24/2018 1346   UROBILINOGEN 0.2 08/03/2017 1100   UROBILINOGEN 1.0 05/28/2015 0938   NITRITE NEGATIVE 02/24/2018 1346   LEUKOCYTESUR  NEGATIVE 02/24/2018 1346   Sepsis Labs: !!!!!!!!!!!!!!!!!!!!!!!!!!!!!!!!!!!!!!!!!!!! _1 (procalcitonin:4,lacticidven:4) )No results found for this or any previous visit (from the past 240 hour(s)).   Radiological Exams on Admission: Ct Head Wo Contrast  Result Date: 02/24/2018 CLINICAL DATA:  Altered level  of consciousness.  Memory disturbance. EXAM: CT HEAD WITHOUT CONTRAST TECHNIQUE: Contiguous axial images were obtained from the base of the skull through the vertex without intravenous contrast. COMPARISON:  05/29/2015.  MRI 06/01/2015. FINDINGS: Brain: No sign of acute or subacute infarction. There is generalized brain atrophy. 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 Cerebral hemispheres show chronic small-vessel ischemic changes of the deep and subcortical white matter. No cortical or large vessel territory infarction. No mass lesion, hemorrhage, hydrocephalus or extra-axial collection. Vascular: There is atherosclerotic calcification of the major vessels at the base of the brain. Skull: Negative Sinuses/Orbits: Clear sinuses. Old right medial wall lamina papyracea fracture. No acute orbital finding. Other: None IMPRESSION: No acute or reversible finding. Atrophy and chronic small-vessel ischemic changes, similar to the previous exams. Electronically Signed   By: Nelson Chimes M.D.   On: 02/24/2018 13:03   Dg Chest Portable 1 View  Result Date: 02/24/2018 CLINICAL DATA:  Weakness, dizziness for 2 days. Altered mental status EXAM: PORTABLE CHEST 1 VIEW COMPARISON:  10/14/2015 FINDINGS: Bibasilar atelectasis. Low lung volumes. Heart is normal size. No effusions or acute bony abnormality. IMPRESSION: Low lung volumes with bibasilar atelectasis. Electronically Signed   By: Rolm Baptise M.D.   On: 02/24/2018 12:51   Chest x-ray personally reviewed, CT head was personally reviewed  Assessment/Plan Principal Problem:   Dehydration Active Problems:   CAD (coronary  artery disease)   Hyperlipidemia   Chronic back pain   Essential hypertension   Memory difficulty   CAD in native artery   1. Toxic metabolic encephalopathy 1. Worsened confusion likely secondary to clinical dehydration as per below in the setting of poor p.o. Intake 2. Family in room states patient's mentation has improved following 1 L fluids already given, although not yet at baseline 2. Dehydration 1. Patient presents with elevated BUN suggesting mild dehydration 2. Clinical improvement after 1 L of IV fluids 3. Will continue patient with gentle continuous IV fluids overnight 4. Repeat basic metabolic panel in the morning 3. Diabetes mellitus 1. Presenting glucose stable, albeit suboptimally controlled 2. We will continue patient on 70/30 insulin per home regimen 3. We will continue patient on sliding scale insulin as tolerated 4. CAD 1. Patient denies chest pain at this time 2. Stable 5. Hyperlipidemia 1. We will continue home medications as tolerated 2. Stable 6. Hypertension 1. Blood pressure initially soft at time of initial presentation, likely secondary to presenting dehydration 2. Blood pressure improved following 1 L of IV fluids 7. Dementia 1. Patient followed by [facility] neurology, Dr. Jannifer Franklin 2. We will continue dementia medications per home regimen.  Appears to be otherwise stable at this time  DVT prophylaxis: Lovenox subcutaneous Code Status: DO NOT RESUSCITATE per patient's wishes, confirmed by patient's family member in room Family Communication: Patient in room, family at bedside Disposition Plan: Uncertain at this time Consults called:  Admission status: Observation status as will likely require less than 2 midnight stay to resolve dehydration  Marylu Lund MD [facility] Pager [phone]  If 7PM-7AM, please contact night-coverage [email or web address] Password [email or web address]  02/24/2018, 4:59 PM

## 2018-02-24 NOTE — Progress Notes (Signed)
Pt admitted to [place] room 15. Family at bedside. Assessment charted. Call bell within reach. Will continue to monitor.

## 2018-02-24 NOTE — ED Notes (Signed)
Pt assisted with 3 RNs to bed.

## 2018-02-24 NOTE — ED Notes (Signed)
Dr. Alvino Chapel notified of elevated CG-4

## 2018-02-24 NOTE — ED Notes (Signed)
Patient transported to CT 

## 2018-02-25 DIAGNOSIS — E1165 Type 2 diabetes mellitus with hyperglycemia: Secondary | ICD-10-CM | POA: Diagnosis not present

## 2018-02-25 DIAGNOSIS — Z87891 Personal history of nicotine dependence: Secondary | ICD-10-CM | POA: Diagnosis not present

## 2018-02-25 DIAGNOSIS — I252 Old myocardial infarction: Secondary | ICD-10-CM | POA: Diagnosis not present

## 2018-02-25 DIAGNOSIS — R2681 Unsteadiness on feet: Secondary | ICD-10-CM | POA: Diagnosis not present

## 2018-02-25 DIAGNOSIS — R531 Weakness: Secondary | ICD-10-CM | POA: Diagnosis not present

## 2018-02-25 DIAGNOSIS — Z7401 Bed confinement status: Secondary | ICD-10-CM | POA: Diagnosis not present

## 2018-02-25 DIAGNOSIS — R41841 Cognitive communication deficit: Secondary | ICD-10-CM | POA: Diagnosis not present

## 2018-02-25 DIAGNOSIS — I491 Atrial premature depolarization: Secondary | ICD-10-CM | POA: Diagnosis not present

## 2018-02-25 DIAGNOSIS — E860 Dehydration: Secondary | ICD-10-CM

## 2018-02-25 DIAGNOSIS — Z043 Encounter for examination and observation following other accident: Secondary | ICD-10-CM | POA: Diagnosis not present

## 2018-02-25 DIAGNOSIS — Z741 Need for assistance with personal care: Secondary | ICD-10-CM | POA: Diagnosis not present

## 2018-02-25 DIAGNOSIS — I10 Essential (primary) hypertension: Secondary | ICD-10-CM

## 2018-02-25 DIAGNOSIS — Z794 Long term (current) use of insulin: Secondary | ICD-10-CM | POA: Diagnosis not present

## 2018-02-25 DIAGNOSIS — Z79899 Other long term (current) drug therapy: Secondary | ICD-10-CM | POA: Diagnosis not present

## 2018-02-25 DIAGNOSIS — I2510 Atherosclerotic heart disease of native coronary artery without angina pectoris: Secondary | ICD-10-CM | POA: Diagnosis not present

## 2018-02-25 DIAGNOSIS — S79911A Unspecified injury of right hip, initial encounter: Secondary | ICD-10-CM | POA: Diagnosis not present

## 2018-02-25 DIAGNOSIS — R413 Other amnesia: Secondary | ICD-10-CM

## 2018-02-25 DIAGNOSIS — R55 Syncope and collapse: Secondary | ICD-10-CM | POA: Diagnosis not present

## 2018-02-25 DIAGNOSIS — M25551 Pain in right hip: Secondary | ICD-10-CM | POA: Diagnosis not present

## 2018-02-25 DIAGNOSIS — G4489 Other headache syndrome: Secondary | ICD-10-CM | POA: Diagnosis not present

## 2018-02-25 DIAGNOSIS — M6281 Muscle weakness (generalized): Secondary | ICD-10-CM | POA: Diagnosis not present

## 2018-02-25 DIAGNOSIS — S2231XA Fracture of one rib, right side, initial encounter for closed fracture: Secondary | ICD-10-CM | POA: Diagnosis not present

## 2018-02-25 DIAGNOSIS — I4510 Unspecified right bundle-branch block: Secondary | ICD-10-CM | POA: Diagnosis not present

## 2018-02-25 DIAGNOSIS — M6389 Disorders of muscle in diseases classified elsewhere, multiple sites: Secondary | ICD-10-CM | POA: Diagnosis not present

## 2018-02-25 DIAGNOSIS — M2550 Pain in unspecified joint: Secondary | ICD-10-CM | POA: Diagnosis not present

## 2018-02-25 DIAGNOSIS — G9341 Metabolic encephalopathy: Secondary | ICD-10-CM | POA: Diagnosis not present

## 2018-02-25 DIAGNOSIS — F0390 Unspecified dementia without behavioral disturbance: Secondary | ICD-10-CM | POA: Diagnosis not present

## 2018-02-25 DIAGNOSIS — E119 Type 2 diabetes mellitus without complications: Secondary | ICD-10-CM | POA: Diagnosis not present

## 2018-02-25 LAB — CBC
HCT: 43 % (ref 39.0–52.0)
Hemoglobin: 14.1 g/dL (ref 13.0–17.0)
MCH: 31.3 pg (ref 26.0–34.0)
MCHC: 32.8 g/dL (ref 30.0–36.0)
MCV: 95.3 fL (ref 78.0–100.0)
PLATELETS: 126 10*3/uL — AB (ref 150–400)
RBC: 4.51 MIL/uL (ref 4.22–5.81)
RDW: 13 % (ref 11.5–15.5)
WBC: 10.5 10*3/uL (ref 4.0–10.5)

## 2018-02-25 LAB — COMPREHENSIVE METABOLIC PANEL
ALT: 20 U/L (ref 17–63)
AST: 26 U/L (ref 15–41)
Albumin: 2.7 g/dL — ABNORMAL LOW (ref 3.5–5.0)
Alkaline Phosphatase: 82 U/L (ref 38–126)
Anion gap: 9 (ref 5–15)
BUN: 21 mg/dL — AB (ref 6–20)
CHLORIDE: 106 mmol/L (ref 101–111)
CO2: 26 mmol/L (ref 22–32)
CREATININE: 1.22 mg/dL (ref 0.61–1.24)
Calcium: 8.4 mg/dL — ABNORMAL LOW (ref 8.9–10.3)
GFR calc Af Amer: >60 mL/min (ref >60)
GFR, EST NON AFRICAN AMERICAN: 53 mL/min — AB (ref >60)
Glucose, Bld: 85 mg/dL (ref 65–99)
POTASSIUM: 3.8 mmol/L (ref 3.5–5.1)
Sodium: 141 mmol/L (ref 135–145)
Total Bilirubin: 1.1 mg/dL (ref 0.3–1.2)
Total Protein: 5.6 g/dL — ABNORMAL LOW (ref 6.5–8.1)

## 2018-02-25 LAB — URINE CULTURE: Culture: NO GROWTH

## 2018-02-25 LAB — GLUCOSE, CAPILLARY
GLUCOSE-CAPILLARY: 196 mg/dL — AB (ref 65–99)
GLUCOSE-CAPILLARY: 90 mg/dL (ref 65–99)
GLUCOSE-CAPILLARY: 54 mg/dL — AB (ref 65–99)
Glucose-Capillary: 79 mg/dL (ref 65–99)
Glucose-Capillary: 57 mg/dL — ABNORMAL LOW (ref 65–99)
Glucose-Capillary: 130 mg/dL — ABNORMAL HIGH (ref 65–99)

## 2018-02-25 NOTE — Evaluation (Signed)
Occupational Therapy Evaluation Patient Details Name: Joseph Mora MRN: [id number] DOB: Aug 16, 1933 Today's Date: 02/25/2018    History of Present Illness Pt is an 84 year old man with baseline dementia, CAD, DM, and arthritis admitted from home with confusion, dehydration, decreased PO intake and weakness with inability to ambulate.   Clinical Impression   Per pt's wife, pt is assisted for ADL and IADL and walks with a cane or walker, but has had multiple falls. Pt currently requires min to total assist for ADL and requires 2 person assist for OOB mobility. Pt will need post acute rehab prior to return home with his family. Family is in agreement. Will follow acutely.    Follow Up Recommendations  SNF;Supervision/Assistance - 24 hour    Equipment Recommendations       Recommendations for Other Services       Precautions / Restrictions Precautions Precautions: Fall Restrictions Other Position/Activity Restrictions: family reports many falls      Mobility Bed Mobility Overal bed mobility: Needs Assistance Bed Mobility: Supine to Sit     Supine to sit: Mod assist     General bed mobility comments: cues for technique, increased time, assist to raise trunk and shift hips to EOB, posterior lean  Transfers Overall transfer level: Needs assistance Equipment used: Rolling walker (2 wheeled) Transfers: Sit to/from [facility] Sit to Stand: +2 physical assistance;Mod assist Stand pivot transfers: +2 physical assistance;Max assist            Balance Overall balance assessment: Needs assistance   Sitting balance-Leahy Scale: Poor Sitting balance - Comments: posterior lean   Standing balance support: Bilateral upper extremity supported Standing balance-Leahy Scale: Poor Standing balance comment: B UE and external support required, posterior lean                           ADL either performed or assessed with clinical judgement   ADL  Overall ADL's : Needs assistance/impaired Eating/Feeding: Minimal assistance;Sitting Eating/Feeding Details (indicate cue type and reason): assist to cut food, open containers Grooming: Wash/dry hands;Wash/dry face;Sitting;Set up   Upper Body Bathing: Moderate assistance;Sitting   Lower Body Bathing: Total assistance;Sit to/from stand;+2 for physical assistance   Upper Body Dressing : Minimal assistance;Sitting   Lower Body Dressing: Total assistance;+2 for physical assistance;Sit to/from stand   Toilet Transfer: +2 for physical assistance;Stand-pivot;RW;Maximal assistance Toilet Transfer Details (indicate cue type and reason): simulated to chair Toileting- Clothing Manipulation and Hygiene: +2 for physical assistance;Total assistance;Sit to/from stand Toileting - Clothing Manipulation Details (indicate cue type and reason): pt unaware of bowel incontinence in bed             Vision Baseline Vision/History: Wears glasses Wears Glasses: Reading only Patient Visual Report: No change from baseline       Perception     Praxis      Pertinent Vitals/Pain Pain Assessment: No/denies pain     Hand Dominance Right   Extremity/Trunk Assessment Upper Extremity Assessment Upper Extremity Assessment: RUE deficits/detail;LUE deficits/detail RUE Deficits / Details: + tremor, generalized weakness RUE Coordination: decreased fine motor LUE Deficits / Details: + tremor, generalized weakness LUE Coordination: decreased fine motor   Lower Extremity Assessment Lower Extremity Assessment: Defer to PT evaluation       Communication Communication Communication: No difficulties   Cognition Arousal/Alertness: Awake/alert Behavior During Therapy: WFL for tasks assessed/performed Overall Cognitive Status: History of cognitive impairments - at baseline  General Comments       Exercises     Shoulder Instructions      Home Living  Family/patient expects to be discharged to:: Private residence Living Arrangements: Spouse/significant other;Children(son has a developmental disability) Available Help at Discharge: Family;Available 24 hours/day Type of Home: House Home Access: Ramped entrance     Home Layout: One level               Home Equipment: Walker - 2 wheels;Cane - single point          Prior Functioning/Environment Level of Independence: Needs assistance  Gait / Transfers Assistance Needed: many falls, was walking with a cane or a walker prior to admission ADL's / Homemaking Assistance Needed: assisted for ADL and IADL, self fed   Comments: 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        OT Problem List: Decreased strength;Decreased activity tolerance;Impaired balance (sitting and/or standing);Decreased cognition;Decreased knowledge of use of DME or AE;Decreased coordination;Impaired UE functional use      OT Treatment/Interventions: Self-care/ADL training;DME and/or AE instruction;Therapeutic activities;Patient/family education;Balance training    OT Goals(Current goals can be found in the care plan section) Acute Rehab OT Goals Patient Stated Goal: to get better OT Goal Formulation: With patient Time For Goal Achievement: 03/11/18 Potential to Achieve Goals: Good ADL Goals Pt Will Perform Grooming: sitting;with set-up;with min assist Pt Will Perform Upper Body Bathing: with min assist;sitting Pt Will Perform Upper Body Dressing: with supervision;sitting Pt Will Transfer to Toilet: with mod assist;ambulating;bedside commode Additional ADL Goal #1: Pt will perform bed mobility with min assist.  OT Frequency: Min 2X/week   Barriers to D/C: Decreased caregiver support          Co-evaluation PT/OT/SLP Co-Evaluation/Treatment: Yes Reason for Co-Treatment: For patient/therapist safety   OT goals addressed during session: ADL's and self-care      AM-PAC PT "6 Clicks" Daily Activity      Outcome Measure Help from another person eating meals?: A Little Help from another person taking care of personal grooming?: A Lot Help from another person toileting, which includes using toliet, bedpan, or urinal?: Total Help from another person bathing (including washing, rinsing, drying)?: A Lot Help from another person to put on and taking off regular upper body clothing?: A Little Help from another person to put on and taking off regular lower body clothing?: Total 6 Click Score: 12   End of Session Equipment Utilized During Treatment: Gait belt;Rolling walker Nurse Communication: Mobility status  Activity Tolerance: Patient tolerated treatment well Patient left: in chair;with call bell/phone within reach;with chair alarm set  OT Visit Diagnosis: Unsteadiness on feet (R26.81);Muscle weakness (generalized) (M62.81);Cognitive communication deficit (R41.841)                Time: 7035-0093 OT Time Calculation (min): 32 min Charges:  OT General Charges $OT Visit: 1 Visit OT Evaluation $OT Eval Moderate Complexity: 1 Mod G-Codes:     March 08, 2018 Nestor Lewandowsky, OTR/L Pager: [phone]  Werner Lean, Haze Boyden 08-Mar-2018, 10:35 AM

## 2018-02-25 NOTE — Progress Notes (Signed)
PROGRESS NOTE    Joseph Mora  LZJ:[id number] DOB: [birth year] DOA: 02/24/2018 PCP: Elby Showers, MD  Brief Narrative: Patient is a 84 y.o male who present with increasing confusion and decreased oral intake over the last 2 to 3 days.    Assessment & Plan: Toxic metabolic encephalopathy: Patient initially presented with worsening confusion but today is alert and oriented to person and place but disoriented to situation   Dehydration: Continue IV hydration and continue to monitor labs daily   Diabetes mellitus: CBG continues to remain stable, continue home 70/30 and SSI  CAD: Continue to have no chest pain, continue home Lipitor   Hyperlipidemia: Continue home Lipitor   Hypertension: Initially soft on presentation likely secondary to dehydration but has improved following hydration, continue to monitor   Dementia: Patient is seen by Dr. Jannifer Franklin at [facility], continue home medication regiment. He appears to be nearing his baseline will speak with family later to confirm.   DVT prophylaxis: Lovenox Code Status: DNR Family Communication: NO family present at bedside during exam, will follow up  Disposition Plan: Remains inpatient-anticipated discharge tomorrow 02/26/18   Consultants:   None   Procedures:   None  Antimicrobials:  Anti-infectives (From admission, onward)   None        Subjective: Patient resting in bed upon my assessment, he has no new complaints this am and is alert and oriented x2.   Objective: Vitals:   02/24/18 1730 02/24/18 1807 02/24/18 2210 02/25/18 0510  BP: 120/77 (!) 146/75 134/76 (!) 142/66  Pulse: 82 87 97 94  Resp: 19 20 18 18   Temp:  98.2 F (36.8 C) 98.1 F (36.7 C) 98.6 F (37 C)  TempSrc:  Oral Oral Axillary  SpO2: 97% 96% 95% 94%  Weight:      Height:        Intake/Output Summary (Last 24 hours) at 02/25/2018 0917 Last data filed at 02/25/2018 0800 Gross per 24 hour  Intake 2133.75 ml  Output -  Net 2133.75 ml    Filed Weights   02/24/18 1658  Weight: 92.1 kg (203 lb)    Examination: General exam: Appears calm and comfortable Respiratory system: Clear to auscultation. Respiratory effort normal. Cardiovascular system: S1 & S2 heard, RRR. No JVD, murmurs, rubs, gallops or clicks. No pedal edema. Gastrointestinal system: Abdomen is nondistended, soft and nontender. No organomegaly or masses felt. Normal bowel sounds heard. Central nervous system: Alert and oriented x2. No focal neurological deficits. Extremities: Symmetric 5 x 5 power. Skin: No rashes, lesions or ulcers Psychiatry: Judgement and insight appear normal. Mood & affect appropriate.   Data Reviewed: I have personally reviewed following labs and imaging studies  CBC: Recent Labs  Lab 02/24/18 1225 02/24/18 1237 02/25/18 0704  WBC 11.1*  --  10.5  NEUTROABS 9.2*  --   --   HGB 15.7 16.0 14.1  HCT 48.0 47.0 43.0  MCV 94.3  --  95.3  PLT 145*  --  024*   Basic Metabolic Panel: Recent Labs  Lab 02/24/18 1225 02/24/18 1237 02/25/18 0704  NA 136 139 141  K 4.0 4.3 3.8  CL 100* 102 106  CO2 23  --  26  GLUCOSE 181* 179* 85  BUN 22* 28* 21*  CREATININE 1.42* 1.10 1.22  CALCIUM 8.9  --  8.4*   GFR: Estimated Creatinine Clearance: 47.4 mL/min (by C-G formula based on SCr of 1.22 mg/dL). Liver Function Tests: Recent Labs  Lab 02/24/18 1225 02/25/18 0704  AST 29 26  ALT 30 20  ALKPHOS 103 82  BILITOT 1.2 1.1  PROT 7.0 5.6*  ALBUMIN 3.6 2.7*   No results for input(s): LIPASE, AMYLASE in the last 168 hours. No results for input(s): AMMONIA in the last 168 hours. Coagulation Profile: Recent Labs  Lab 02/24/18 1225  INR 1.11   Cardiac Enzymes: No results for input(s): CKTOTAL, CKMB, CKMBINDEX, TROPONINI in the last 168 hours. BNP (last 3 results) No results for input(s): PROBNP in the last 8760 hours. HbA1C: No results for input(s): HGBA1C in the last 72 hours. CBG: Recent Labs  Lab 02/24/18 1217  02/24/18 2037 02/24/18 2159 02/25/18 0751  GLUCAP 168* 120* 105* 79   Lipid Profile: No results for input(s): CHOL, HDL, LDLCALC, TRIG, CHOLHDL, LDLDIRECT in the last 72 hours. Thyroid Function Tests: No results for input(s): TSH, T4TOTAL, FREET4, T3FREE, THYROIDAB in the last 72 hours. Anemia Panel: No results for input(s): VITAMINB12, FOLATE, FERRITIN, TIBC, IRON, RETICCTPCT in the last 72 hours. Urine analysis:    Component Value Date/Time   COLORURINE YELLOW 02/24/2018 1346   APPEARANCEUR CLEAR 02/24/2018 1346   LABSPEC 1.024 02/24/2018 1346   PHURINE 5.0 02/24/2018 1346   GLUCOSEU 50 (A) 02/24/2018 1346   HGBUR NEGATIVE 02/24/2018 1346   BILIRUBINUR NEGATIVE 02/24/2018 1346   BILIRUBINUR negative 08/03/2017 1100   KETONESUR 20 (A) 02/24/2018 1346   PROTEINUR NEGATIVE 02/24/2018 1346   UROBILINOGEN 0.2 08/03/2017 1100   UROBILINOGEN 1.0 05/28/2015 0938   NITRITE NEGATIVE 02/24/2018 1346   LEUKOCYTESUR NEGATIVE 02/24/2018 1346   Sepsis Labs: @LABRCNTIP (procalcitonin:4,lacticidven:4)  )No results found for this or any previous visit (from the past 240 hour(s)).    Radiology Studies: Ct Head Wo Contrast  Result Date: 02/24/2018 CLINICAL DATA:  Altered level of consciousness.  Memory disturbance. EXAM: CT HEAD WITHOUT CONTRAST TECHNIQUE: Contiguous axial images were obtained from the base of the skull through the vertex without intravenous contrast. COMPARISON:  05/29/2015.  MRI 06/01/2015. FINDINGS: Brain: No sign of acute or subacute infarction. There is generalized brain atrophy. 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 Cerebral hemispheres show chronic small-vessel ischemic changes of the deep and subcortical white matter. No cortical or large vessel territory infarction. No mass lesion, hemorrhage, hydrocephalus or extra-axial collection. Vascular: There is atherosclerotic calcification of the major vessels at the base of the brain. Skull:  Negative Sinuses/Orbits: Clear sinuses. Old right medial wall lamina papyracea fracture. No acute orbital finding. Other: None IMPRESSION: No acute or reversible finding. Atrophy and chronic small-vessel ischemic changes, similar to the previous exams. Electronically Signed   By: Nelson Chimes M.D.   On: 02/24/2018 13:03   Dg Chest Portable 1 View  Result Date: 02/24/2018 CLINICAL DATA:  Weakness, dizziness for 2 days. Altered mental status EXAM: PORTABLE CHEST 1 VIEW COMPARISON:  10/14/2015 FINDINGS: Bibasilar atelectasis. Low lung volumes. Heart is normal size. No effusions or acute bony abnormality. IMPRESSION: Low lung volumes with bibasilar atelectasis. Electronically Signed   By: Rolm Baptise M.D.   On: 02/24/2018 12:51    Scheduled Meds: . aspirin  81 mg Oral Daily  . atorvastatin  40 mg Oral Daily  . donepezil  10 mg Oral QHS  . doxazosin  2 mg Oral QHS  . enoxaparin (LOVENOX) injection  40 mg Subcutaneous Q24H  . insulin aspart  0-15 Units Subcutaneous TID WC  . insulin aspart  0-5 Units Subcutaneous QHS  . insulin aspart protamine- aspart  30 Units Subcutaneous Q  supper  . insulin aspart protamine- aspart  50 Units Subcutaneous Q breakfast  . lisinopril  5 mg Oral Daily  . memantine  10 mg Oral BID  . metoprolol tartrate  50 mg Oral BID  . mirabegron ER  50 mg Oral Daily  . sertraline  50 mg Oral Daily  . tamsulosin  0.4 mg Oral Daily   Continuous Infusions: . lactated ringers 75 mL/hr at 02/24/18 1829     LOS: 0 days    Time spent:  25 minutes-Greater than 50% of this time was spent in counseling, explanation of diagnosis, planning of further management, and coordination of care.\  Johnsie Cancel, NP [facility] Pager [phone]  If 7PM-7AM, please contact night-coverage [email or web address] Password [email or web address] 02/25/2018, 9:17 AM

## 2018-02-25 NOTE — Progress Notes (Signed)
Patient will DC to: [facility] Anticipated DC date: 02/25/18 Family notified: Daughter at bedside Transport by: Corey Harold   Per MD patient ready for DC to [facility]. RN, patient, patient's family, and facility notified of DC. Discharge Summary sent to facility. RN given number for report [phone] Room 404A). DC packet on chart. Ambulance transport requested for patient.   CSW signing off.  Cedric Fishman, LCSW Clinical Social Worker [phone]

## 2018-02-25 NOTE — Progress Notes (Signed)
Hypoglycemic Event  CBG: 54  Treatment: 15 GM carbohydrate snack  Symptoms: None  Follow-up CBG: Time: 2131 CBG Result: 130  Possible Reasons for Event: Inadequate meal intake  Comments/MD notified: Bodenheimer, NP notified. Patient given orange juice and reported no symptoms.     Dorina Hoyer, RN

## 2018-02-25 NOTE — Progress Notes (Signed)
Report given to RN at [facility].

## 2018-02-25 NOTE — Discharge Summary (Signed)
PATIENT DETAILS Name: Joseph Mora Age: 84 y.o. Sex: male Date of Birth: 1932-11-14 MRN: [id number]. Admitting Physician: Donne Hazel, MD [id number], Cresenciano Lick, MD  Admit Date: 02/24/2018 Discharge date: 02/25/2018  Recommendations for Outpatient Follow-up:  1. Follow up with PCP in 1-2 weeks 2. Please obtain BMP/CBC in one week  Admitted From:  Home  Disposition: SNF   Home Health: No  Equipment/Devices: None  Discharge Condition: Stable  CODE STATUS:  DNR  Diet recommendation:  Heart Healthy / Carb Modified   Brief Summary: See H&P, Labs, Consult and Test reports for all details in brief, patient is a 84 year old male with history of dementia, insulin-dependent DM-2, hypertension who presented with worsening confusion.  He was thought to have dehydration and admitted to the hospitalist service.  Brief Hospital Course: Confusion likely secondary to metabolic encephalopathy in the setting of mild dehydration superimposed on dementia: Seems to have improved-suspect he is not very far from his baseline.  Urine/chest x-ray negative for any infection, no major electrolyte abnormalities-do not see any new medications that could have contributed to his worsening confusion.  Suspect either this is mild dehydration or just part of the dementia spectrum.  In any event, he has improved with simple hydration-suspect he is close to his usual baseline.  Seen by physical therapy services with recommendations to discharge to SNF.  Insulin-dependent diabetes: CBG stable-continue with insulin 70/30.  Hypertension: Stable with lisinopril and metoprolol.  Dementia: See above-continue Namenda, Aricept and Zoloft  Dyslipidemia: Continue statin  CAD: No anginal symptoms-continue with aspirin, statin and beta-blocker  BPH:: Continue Flomax  Procedures/Studies: None  Discharge Diagnoses:  Principal Problem:   Dehydration Active Problems:   CAD (coronary artery disease)  Hyperlipidemia   Chronic back pain   Essential hypertension   Memory difficulty   CAD in native artery   Discharge Instructions:  Activity:  As tolerated with Full fall precautions use walker/cane & assistance as needed  Discharge Instructions    Diet - low sodium heart healthy   Complete by:  As directed    Diet Carb Modified   Complete by:  As directed    Discharge instructions   Complete by:  As directed    Follow with Primary MD  Elby Showers, MD in 1 week  Please get a complete blood count and chemistry panel checked by your Primary MD at your next visit, and again as instructed by your Primary MD.  Get Medicines reviewed and adjusted: Please take all your medications with you for your next visit with your Primary MD  Laboratory/radiological data: Please request your Primary MD to go over all hospital tests and procedure/radiological results at the follow up, please ask your Primary MD to get all Hospital records sent to his/her office.  In some cases, they will be blood work, cultures and biopsy results pending at the time of your discharge. Please request that your primary care M.D. follows up on these results.  Also Note the following: If you experience worsening of your admission symptoms, develop shortness of breath, life threatening emergency, suicidal or homicidal thoughts you must seek medical attention immediately by calling 911 or calling your MD immediately  if symptoms less severe.  You must read complete instructions/literature along with all the possible adverse reactions/side effects for all the Medicines you take and that have been prescribed to you. Take any new Medicines after you have completely understood and accpet all the possible adverse reactions/side effects.   Do not  drive when taking Pain medications or sleeping medications (Benzodaizepines)  Do not take more than prescribed Pain, Sleep and Anxiety Medications. It is not advisable to combine  anxiety,sleep and pain medications without talking with your primary care practitioner  Special Instructions: If you have smoked or chewed Tobacco  in the last 2 yrs please stop smoking, stop any regular Alcohol  and or any Recreational drug use.  Wear Seat belts while driving.  Please note: You were cared for by a hospitalist during your hospital stay. Once you are discharged, your primary care physician will handle any further medical issues. Please note that NO REFILLS for any discharge medications will be authorized once you are discharged, as it is imperative that you return to your primary care physician (or establish a relationship with a primary care physician if you do not have one) for your post hospital discharge needs so that they can reassess your need for medications and monitor your lab values.   Increase activity slowly   Complete by:  As directed      Allergies as of 02/25/2018      Reactions   Morphine And Related Itching   Penicillins Hives   Has patient had a PCN reaction causing immediate rash, facial/tongue/throat swelling, SOB or lightheadedness with hypotension: No Has patient had a PCN reaction causing severe rash involving mucus membranes or skin necrosis: No Has patient had a PCN reaction that required hospitalization: No Has patient had a PCN reaction occurring within the last 10 years: No If all of the above answers are "NO", then may proceed with Cephalosporin use.      Medication List    STOP taking these medications   doxazosin 2 MG tablet Commonly known as:  CARDURA     TAKE these medications   aspirin 81 MG tablet Take 81 mg by mouth daily.   atorvastatin 40 MG tablet Commonly known as:  LIPITOR TAKE 1 TABLET BY MOUTH DAILY   cyanocobalamin 100 MCG tablet Take 1 tablet (100 mcg total) by mouth daily.   donepezil 10 MG tablet Commonly known as:  ARICEPT TAKE 1 TABLET(10 MG) BY MOUTH AT BEDTIME   insulin NPH-regular Human (70-30) 100 UNIT/ML  injection Commonly known as:  NOVOLIN 70/30 Inject 10-50 Units into the skin See admin instructions. Inject 50 units in the morning, 10 units at lunch and 30 units in the evening, before meals.   lisinopril 5 MG tablet Commonly known as:  PRINIVIL,ZESTRIL Take 1 tablet (5 mg total) by mouth daily.   memantine 10 MG tablet Commonly known as:  NAMENDA Take 1 tablet (10 mg total) by mouth 2 (two) times daily.   metoprolol tartrate 50 MG tablet Commonly known as:  LOPRESSOR TAKE 1 TABLET BY MOUTH TWICE DAILY   MYRBETRIQ 50 MG Tb24 tablet Generic drug:  mirabegron ER Take 50 mg by mouth daily.   nitrofurantoin 100 MG capsule Commonly known as:  MACRODANTIN Take 100 mg by mouth daily.   nitroGLYCERIN 0.4 MG SL tablet Commonly known as:  NITROSTAT Place 1 tablet (0.4 mg total) under the tongue every 5 (five) minutes as needed for chest pain.   sertraline 50 MG tablet Commonly known as:  ZOLOFT Take 1 tablet (50 mg total) by mouth daily.   tamsulosin 0.4 MG Caps capsule Commonly known as:  FLOMAX TAKE 1 CAPSULE BY MOUTH EVERY DAY      Contact information for after-discharge care    Destination    [facility] .  Service:  Skilled Nursing Contact information: [place] [phone]             Allergies  Allergen Reactions  . Morphine And Related Itching  . Penicillins Hives    Has patient had a PCN reaction causing immediate rash, facial/tongue/throat swelling, SOB or lightheadedness with hypotension: No Has patient had a PCN reaction causing severe rash involving mucus membranes or skin necrosis: No Has patient had a PCN reaction that required hospitalization: No Has patient had a PCN reaction occurring within the last 10 years: No If all of the above answers are "NO", then may proceed with Cephalosporin use.     Consultations:   None  Other Procedures/Studies: Ct Head Wo  Contrast  Result Date: 02/24/2018 CLINICAL DATA:  Altered level of consciousness.  Memory disturbance. EXAM: CT HEAD WITHOUT CONTRAST TECHNIQUE: Contiguous axial images were obtained from the base of the skull through the vertex without intravenous contrast. COMPARISON:  05/29/2015.  MRI 06/01/2015. FINDINGS: Brain: No sign of acute or subacute infarction. There is generalized brain atrophy. 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 Cerebral hemispheres show chronic small-vessel ischemic changes of the deep and subcortical white matter. No cortical or large vessel territory infarction. No mass lesion, hemorrhage, hydrocephalus or extra-axial collection. Vascular: There is atherosclerotic calcification of the major vessels at the base of the brain. Skull: Negative Sinuses/Orbits: Clear sinuses. Old right medial wall lamina papyracea fracture. No acute orbital finding. Other: None IMPRESSION: No acute or reversible finding. Atrophy and chronic small-vessel ischemic changes, similar to the previous exams. Electronically Signed   By: Nelson Chimes M.D.   On: 02/24/2018 13:03   Mr Lumbar Spine Wo Contrast  Result Date: 02/02/2018 CLINICAL DATA:  84 year old male with chronic lumbar back pain, persistent for 6 weeks. Remote prior surgery about 20 years ago. EXAM: MRI LUMBAR SPINE WITHOUT CONTRAST TECHNIQUE: Multiplanar, multisequence MR imaging of the lumbar spine was performed. No intravenous contrast was administered. COMPARISON:  No prior lumbar imaging. FINDINGS: Segmentation: Lumbar segmentation appears to be normal and will be designated as such for this report. Alignment: Mild straightening of lumbar lordosis. Mild retrolisthesis of L2 on L3 and L5 on S1. Vertebrae: Interbody fusion hardware susceptibility artifact at L3-L4 and L4-L5. Superimposed degenerative vertebral body endplate changes with a mix of mild acute marrow edema and chronic degenerative endplate marrow signal  changes at L1-L2 and L2-L3. Background bone marrow signal is normal. Intact visible sacrum and SI joints. Conus medullaris and cauda equina: Conus extends to the L1 level. No lower spinal cord or conus signal abnormality. Paraspinal and other soft tissues: Postoperative changes to the posterior paraspinal soft tissues at L4 and L5. No postoperative fluid collection. Negative visible abdominal viscera. Disc levels: T11-T12: Negative. T12-L1:  Negative. L1-L2: Severe disc space loss with bulky circumferential disc osteophyte complex. Broad-based posterior and biforaminal involvement, slightly eccentric to the left. Mild facet and ligament flavum hypertrophy. Mild to moderate spinal stenosis and bilateral L1 neural foraminal stenosis. L2-L3: Severe disc space loss. Probable vacuum disc. Bulky circumferential disc osteophyte complex with broad-based posterior and biforaminal involvement. Mild facet and ligament flavum hypertrophy. Moderate spinal stenosis. Mild to moderate bilateral L2 neural foraminal stenosis. L3-L4: Prior interbody cage type implants and decompression. Right side foraminal granulation tissue. No definite stenosis. L4-L5: Prior interbody cage type implants and decompression. Bilateral neural foraminal granulation tissue greater on the right. No definite stenosis. L5-S1: Severe disc space loss with vacuum  disc. Bulky posterior and lateral disc osteophyte complex with biforaminal involvement. Moderate facet hypertrophy. Epidural lipomatosis. Moderate overall spinal stenosis with when considering the epidural fat. Mild to moderate bilateral lateral recess stenosis at the descending S1 nerve levels, greater on the right. Mild to moderate bilateral L5 neural foraminal stenosis. IMPRESSION: 1. Prior decompression and interbody fusion implant placement at L3-L4 and L4-L5. 2. Severe lumbar disc and endplate degeneration elsewhere with mild to moderate multifactorial spinal and bilateral neural foraminal  stenosis. 3. Mild acute exacerbation of the chronic endplate degeneration at L1-L2 and L2-L3 with marrow edema. Electronically Signed   By: Genevie Ann M.D.   On: 02/02/2018 14:57   Dg Chest Portable 1 View  Result Date: 02/24/2018 CLINICAL DATA:  Weakness, dizziness for 2 days. Altered mental status EXAM: PORTABLE CHEST 1 VIEW COMPARISON:  10/14/2015 FINDINGS: Bibasilar atelectasis. Low lung volumes. Heart is normal size. No effusions or acute bony abnormality. IMPRESSION: Low lung volumes with bibasilar atelectasis. Electronically Signed   By: Rolm Baptise M.D.   On: 02/24/2018 12:51     TODAY-DAY OF DISCHARGE:  Subjective:   Lear Ng today has no headache,no chest abdominal pain,no new weakness tingling or numbness, f  Objective:   Blood pressure 114/62, pulse 90, temperature 98.9 F (37.2 C), temperature source Oral, resp. rate 18, height 5' 4.5" (1.638 m), weight 92.1 kg (203 lb), SpO2 98 %.  Intake/Output Summary (Last 24 hours) at 02/25/2018 1621 Last data filed at 02/25/2018 1300 Gross per 24 hour  Intake 2513.75 ml  Output -  Net 2513.75 ml   Filed Weights   02/24/18 1658  Weight: 92.1 kg (203 lb)    Exam: Awake Alert, pleasantly confused, No new F.N deficits, Normal affect [place].AT,PERRAL Supple Neck,No JVD, No cervical lymphadenopathy appriciated.  Symmetrical Chest wall movement, Good air movement bilaterally, CTAB RRR,No Gallops,Rubs or new Murmurs, No Parasternal Heave +ve B.Sounds, Abd Soft, Non tender, No organomegaly appriciated, No rebound -guarding or rigidity. No Cyanosis, Clubbing or edema, No new Rash or bruise   PERTINENT RADIOLOGIC STUDIES: Ct Head Wo Contrast  Result Date: 02/24/2018 CLINICAL DATA:  Altered level of consciousness.  Memory disturbance. EXAM: CT HEAD WITHOUT CONTRAST TECHNIQUE: Contiguous axial images were obtained from the base of the skull through the vertex without intravenous contrast. COMPARISON:  05/29/2015.  MRI 06/01/2015. FINDINGS:  Brain: No sign of acute or subacute infarction. There is generalized brain atrophy. 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 Cerebral hemispheres show chronic small-vessel ischemic changes of the deep and subcortical white matter. No cortical or large vessel territory infarction. No mass lesion, hemorrhage, hydrocephalus or extra-axial collection. Vascular: There is atherosclerotic calcification of the major vessels at the base of the brain. Skull: Negative Sinuses/Orbits: Clear sinuses. Old right medial wall lamina papyracea fracture. No acute orbital finding. Other: None IMPRESSION: No acute or reversible finding. Atrophy and chronic small-vessel ischemic changes, similar to the previous exams. Electronically Signed   By: Nelson Chimes M.D.   On: 02/24/2018 13:03   Mr Lumbar Spine Wo Contrast  Result Date: 02/02/2018 CLINICAL DATA:  84 year old male with chronic lumbar back pain, persistent for 6 weeks. Remote prior surgery about 20 years ago. EXAM: MRI LUMBAR SPINE WITHOUT CONTRAST TECHNIQUE: Multiplanar, multisequence MR imaging of the lumbar spine was performed. No intravenous contrast was administered. COMPARISON:  No prior lumbar imaging. FINDINGS: Segmentation: Lumbar segmentation appears to be normal and will be designated as such for this report. Alignment: Mild straightening of  lumbar lordosis. Mild retrolisthesis of L2 on L3 and L5 on S1. Vertebrae: Interbody fusion hardware susceptibility artifact at L3-L4 and L4-L5. Superimposed degenerative vertebral body endplate changes with a mix of mild acute marrow edema and chronic degenerative endplate marrow signal changes at L1-L2 and L2-L3. Background bone marrow signal is normal. Intact visible sacrum and SI joints. Conus medullaris and cauda equina: Conus extends to the L1 level. No lower spinal cord or conus signal abnormality. Paraspinal and other soft tissues: Postoperative changes to the posterior paraspinal soft  tissues at L4 and L5. No postoperative fluid collection. Negative visible abdominal viscera. Disc levels: T11-T12: Negative. T12-L1:  Negative. L1-L2: Severe disc space loss with bulky circumferential disc osteophyte complex. Broad-based posterior and biforaminal involvement, slightly eccentric to the left. Mild facet and ligament flavum hypertrophy. Mild to moderate spinal stenosis and bilateral L1 neural foraminal stenosis. L2-L3: Severe disc space loss. Probable vacuum disc. Bulky circumferential disc osteophyte complex with broad-based posterior and biforaminal involvement. Mild facet and ligament flavum hypertrophy. Moderate spinal stenosis. Mild to moderate bilateral L2 neural foraminal stenosis. L3-L4: Prior interbody cage type implants and decompression. Right side foraminal granulation tissue. No definite stenosis. L4-L5: Prior interbody cage type implants and decompression. Bilateral neural foraminal granulation tissue greater on the right. No definite stenosis. L5-S1: Severe disc space loss with vacuum disc. Bulky posterior and lateral disc osteophyte complex with biforaminal involvement. Moderate facet hypertrophy. Epidural lipomatosis. Moderate overall spinal stenosis with when considering the epidural fat. Mild to moderate bilateral lateral recess stenosis at the descending S1 nerve levels, greater on the right. Mild to moderate bilateral L5 neural foraminal stenosis. IMPRESSION: 1. Prior decompression and interbody fusion implant placement at L3-L4 and L4-L5. 2. Severe lumbar disc and endplate degeneration elsewhere with mild to moderate multifactorial spinal and bilateral neural foraminal stenosis. 3. Mild acute exacerbation of the chronic endplate degeneration at L1-L2 and L2-L3 with marrow edema. Electronically Signed   By: Genevie Ann M.D.   On: 02/02/2018 14:57   Dg Chest Portable 1 View  Result Date: 02/24/2018 CLINICAL DATA:  Weakness, dizziness for 2 days. Altered mental status EXAM: PORTABLE  CHEST 1 VIEW COMPARISON:  10/14/2015 FINDINGS: Bibasilar atelectasis. Low lung volumes. Heart is normal size. No effusions or acute bony abnormality. IMPRESSION: Low lung volumes with bibasilar atelectasis. Electronically Signed   By: Rolm Baptise M.D.   On: 02/24/2018 12:51     PERTINENT LAB RESULTS: CBC: Recent Labs    02/24/18 1225 02/24/18 1237 02/25/18 0704  WBC 11.1*  --  10.5  HGB 15.7 16.0 14.1  HCT 48.0 47.0 43.0  PLT 145*  --  126*   CMET CMP     Component Value Date/Time   NA 141 02/25/2018 0704   K 3.8 02/25/2018 0704   CL 106 02/25/2018 0704   CO2 26 02/25/2018 0704   GLUCOSE 85 02/25/2018 0704   BUN 21 (H) 02/25/2018 0704   CREATININE 1.22 02/25/2018 0704   CREATININE 1.15 (H) 07/30/2017 1053   CALCIUM 8.4 (L) 02/25/2018 0704   PROT 5.6 (L) 02/25/2018 0704   ALBUMIN 2.7 (L) 02/25/2018 0704   AST 26 02/25/2018 0704   ALT 20 02/25/2018 0704   ALKPHOS 82 02/25/2018 0704   BILITOT 1.1 02/25/2018 0704   GFRNONAA 53 (L) 02/25/2018 0704   GFRNONAA 59 (L) 07/30/2017 1053   GFRAA >60 02/25/2018 0704   GFRAA 68 07/30/2017 1053    GFR Estimated Creatinine Clearance: 47.4 mL/min (by C-G formula based on SCr of  1.22 mg/dL). No results for input(s): LIPASE, AMYLASE in the last 72 hours. No results for input(s): CKTOTAL, CKMB, CKMBINDEX, TROPONINI in the last 72 hours. Invalid input(s): POCBNP No results for input(s): DDIMER in the last 72 hours. No results for input(s): HGBA1C in the last 72 hours. No results for input(s): CHOL, HDL, LDLCALC, TRIG, CHOLHDL, LDLDIRECT in the last 72 hours. No results for input(s): TSH, T4TOTAL, T3FREE, THYROIDAB in the last 72 hours.  Invalid input(s): FREET3 No results for input(s): VITAMINB12, FOLATE, FERRITIN, TIBC, IRON, RETICCTPCT in the last 72 hours. Coags: Recent Labs    02/24/18 1225  INR 1.11   Microbiology: Recent Results (from the past 240 hour(s))  Urine culture     Status: None   Collection Time: 02/24/18   1:46 PM  Result Value Ref Range Status   Specimen Description URINE, RANDOM  Final   Special Requests NONE  Final   Culture   Final    NO GROWTH Performed at [facility], [address] [place], [place] [address]    Report Status 02/25/2018 FINAL  Final    FURTHER DISCHARGE INSTRUCTIONS:  Get Medicines reviewed and adjusted: Please take all your medications with you for your next visit with your Primary MD  Laboratory/radiological data: Please request your Primary MD to go over all hospital tests and procedure/radiological results at the follow up, please ask your Primary MD to get all Hospital records sent to his/her office.  In some cases, they will be blood work, cultures and biopsy results pending at the time of your discharge. Please request that your primary care M.D. goes through all the records of your hospital data and follows up on these results.  Also Note the following: If you experience worsening of your admission symptoms, develop shortness of breath, life threatening emergency, suicidal or homicidal thoughts you must seek medical attention immediately by calling 911 or calling your MD immediately  if symptoms less severe.  You must read complete instructions/literature along with all the possible adverse reactions/side effects for all the Medicines you take and that have been prescribed to you. Take any new Medicines after you have completely understood and accpet all the possible adverse reactions/side effects.   Do not drive when taking Pain medications or sleeping medications (Benzodaizepines)  Do not take more than prescribed Pain, Sleep and Anxiety Medications. It is not advisable to combine anxiety,sleep and pain medications without talking with your primary care practitioner  Special Instructions: If you have smoked or chewed Tobacco  in the last 2 yrs please stop smoking, stop any regular Alcohol  and or any Recreational drug use.  Wear Seat belts while  driving.  Please note: You were cared for by a hospitalist during your hospital stay. Once you are discharged, your primary care physician will handle any further medical issues. Please note that NO REFILLS for any discharge medications will be authorized once you are discharged, as it is imperative that you return to your primary care physician (or establish a relationship with a primary care physician if you do not have one) for your post hospital discharge needs so that they can reassess your need for medications and monitor your lab values.  Total Time spent coordinating discharge including counseling, education and face to face time equals  45 minutes.  SignedOren Binet 02/25/2018 4:21 PM

## 2018-02-25 NOTE — Clinical Social Work Note (Signed)
Clinical Social Work Assessment  Patient Details  Name: Joseph Mora MRN: [id number] Date of Birth: 07-15-1933  Date of referral:  02/25/18               Reason for consult:  Facility Placement                Permission sought to share information with:  Facility Sport and exercise psychologist, Family Supports Permission granted to share information::  Yes, Verbal Permission Granted  Name::     Joseph Mora  Agency::  [facility]  Relationship::  Spouse  Contact Information:  [phone]  Housing/Transportation Living arrangements for the past 2 months:  [place] of Information:  Patient, Spouse Patient Interpreter Needed:  None Criminal Activity/Legal Involvement Pertinent to Current Situation/Hospitalization:  No - Comment as needed Significant Relationships:  Spouse Lives with:  Spouse Do you feel safe going back to the place where you live?  No Need for family participation in patient care:  Yes (Comment)  Care giving concerns:  CSW received consult for possible SNF placement at time of discharge. CSW spoke with patient and spouse regarding PT recommendation of SNF placement at time of discharge. Patient's spouse reported that patient's spouse is currently unable to care for patient at their home given patient's current physical needs and fall risk. Patient expressed understanding of PT recommendation and is agreeable to SNF placement at time of discharge. CSW to continue to follow and assist with discharge planning needs.   Social Worker assessment / plan:  CSW spoke with patient an spouse concerning possibility of rehab at [facility] before returning home.  Employment status:  Retired Surveyor, [employer] Care PT Recommendations:  [place] / Referral to community resources:  [place]  Patient/Family's Response to care:  Patient and spouse recognizes need for rehab before returning home and is agreeable to a SNF in [place].  Patient reported preference for [facility]. Spouse asked about [facility] but they are currently full.   Patient/Family's Understanding of and Emotional Response to Diagnosis, Current Treatment, and Prognosis:  Patient/family is realistic regarding therapy needs and expressed being hopeful for SNF placement. Patient and spouse expressed understanding of CSW role and discharge process as well as medical condition. No questions/concerns about plan or treatment.    Emotional Assessment Appearance:  Appears stated age Attitude/Demeanor/Rapport:  Gracious Affect (typically observed):  Accepting, Appropriate Orientation:  Oriented to Self Alcohol / Substance use:  Not Applicable Psych involvement (Current and /or in the community):  No (Comment)  Discharge Needs  Concerns to be addressed:  Care Coordination Readmission within the last 30 days:  No Current discharge risk:  None Barriers to Discharge:  Continued Medical Work up   [employer], [place] 02/25/2018, 2:26 PM

## 2018-02-25 NOTE — NC FL2 (Signed)
[place] LEVEL OF CARE SCREENING TOOL     IDENTIFICATION  Patient Name: Joseph Mora Birthdate: October 25, 1932 Sex: male Admission Date (Current Location): 02/24/2018  [facility] and [place] Number:  Herbalist and Address:  [facility], [place] [address], [place], [place] [address]      Provider Number: [id number]  Attending Physician Name and Address:  Jonetta Osgood, MD  Relative Name and Phone Number:       Current Level of Care: Hospital Recommended Level of Care: [place] Prior Approval Number:    Date Approved/Denied:   PASRR Number: [id number]  Discharge Plan: SNF    Current Diagnoses: Patient Active Problem List   Diagnosis Date Noted  . Dehydration 02/24/2018  . Hypoglycemia 10/14/2015  . CAD in native artery   . Memory difficulty 06/22/2015  . Tremor 06/22/2015  . Premature ventricular contractions 06/22/2015  . Essential hypertension 05/23/2012  . Benign prostatic hyperplasia 04/21/2011  . Chronic back pain 04/21/2011  . Insulin dependent diabetes mellitus ([place]) 04/21/2011  . Hx of adenomatous colonic polyps 04/21/2011  . History of Helicobacter infection 04/21/2011  . CAD (coronary artery disease) 04/03/2011  . Hyperlipidemia 04/03/2011    Orientation RESPIRATION BLADDER Height & Weight     Self, Time, Situation, Place  Normal Continent Weight: 203 lb (92.1 kg) Height:  5' 4.5" (163.8 cm)  BEHAVIORAL SYMPTOMS/MOOD NEUROLOGICAL BOWEL NUTRITION STATUS      Continent Diet(see DC summary)  AMBULATORY STATUS COMMUNICATION OF NEEDS Skin   Extensive Assist Verbally Normal                       Personal Care Assistance Level of Assistance  Bathing, Dressing Bathing Assistance: Maximum assistance   Dressing Assistance: Maximum assistance     Functional Limitations Info             SPECIAL CARE FACTORS FREQUENCY  PT (By licensed PT), OT (By licensed OT)     PT Frequency:  5/wk OT Frequency: 5/wk            Contractures      Additional Factors Info  Code Status, Allergies, Insulin Sliding Scale Code Status Info: DNR Allergies Info: Morphine And Related, Penicillins   Insulin Sliding Scale Info: 6/day       Current Medications (02/25/2018):  This is the current hospital active medication list Current Facility-Administered Medications  Medication Dose Route Frequency Provider Last Rate Last Dose  . acetaminophen (TYLENOL) tablet 650 mg  650 mg Oral Q6H PRN Donne Hazel, MD       Or  . acetaminophen (TYLENOL) suppository 650 mg  650 mg Rectal Q6H PRN Donne Hazel, MD      . aspirin chewable tablet 81 mg  81 mg Oral Daily Donne Hazel, MD   81 mg at 02/25/18 0959  . atorvastatin (LIPITOR) tablet 40 mg  40 mg Oral Daily Donne Hazel, MD   40 mg at 02/25/18 0958  . donepezil (ARICEPT) tablet 10 mg  10 mg Oral QHS Donne Hazel, MD   10 mg at 02/24/18 2206  . doxazosin (CARDURA) tablet 2 mg  2 mg Oral QHS Donne Hazel, MD   2 mg at 02/24/18 2214  . enoxaparin (LOVENOX) injection 40 mg  40 mg Subcutaneous Q24H Donne Hazel, MD   40 mg at 02/24/18 2208  . insulin aspart (novoLOG) injection 0-15 Units  0-15 Units  Subcutaneous TID WC Donne Hazel, MD      . insulin aspart (novoLOG) injection 0-5 Units  0-5 Units Subcutaneous QHS Donne Hazel, MD      . insulin aspart protamine- aspart (NOVOLOG MIX 70/30) injection 30 Units  30 Units Subcutaneous Q supper Donne Hazel, MD   15 Units at 02/24/18 2038  . insulin aspart protamine- aspart (NOVOLOG MIX 70/30) injection 50 Units  50 Units Subcutaneous Q breakfast Donne Hazel, MD   50 Units at 02/25/18 1151  . lactated ringers infusion   Intravenous Continuous Donne Hazel, MD 75 mL/hr at 02/25/18 [phone]    . lisinopril (PRINIVIL,ZESTRIL) tablet 5 mg  5 mg Oral Daily Donne Hazel, MD   5 mg at 02/25/18 1007  . memantine (NAMENDA) tablet 10 mg  10 mg Oral BID Donne Hazel, MD   10  mg at 02/25/18 0959  . metoprolol tartrate (LOPRESSOR) tablet 50 mg  50 mg Oral BID Donne Hazel, MD   50 mg at 02/25/18 1004  . mirabegron ER (MYRBETRIQ) tablet 50 mg  50 mg Oral Daily Donne Hazel, MD   50 mg at 02/25/18 0957  . sertraline (ZOLOFT) tablet 50 mg  50 mg Oral Daily Donne Hazel, MD   50 mg at 02/25/18 0959  . tamsulosin (FLOMAX) capsule 0.4 mg  0.4 mg Oral Daily Donne Hazel, MD   0.4 mg at 02/25/18 1003     Discharge Medications: Please see discharge summary for a list of discharge medications.  Relevant Imaging Results:  Relevant Lab Results:   Additional Information SS#: [id number]  Jorge Ny, LCSW

## 2018-02-25 NOTE — Evaluation (Signed)
Physical Therapy Evaluation Patient Details Name: Joseph BROUILLET MRN: [id number] DOB: May 29, 1933 Today's Date: 02/25/2018   History of Present Illness  Pt is an 84 year old man with baseline dementia, CAD, DM, and arthritis admitted from home with confusion, dehydration, decreased PO intake and weakness with inability to ambulate.  Clinical Impression  Pt admitted with above diagnosis. Pt currently with functional limitations due to the deficits listed below (see PT Problem List). Presenting with significantly decreased functional mobility secondary to decreased balance, activity tolerance, weakness, cognitive impairments, and difficulty with motor planning. Patient transferred from bed to chair using RW and up to two person maximal assistance. Recommending SNF prior to discharge home - family is in agreement. Pt will benefit from skilled PT to increase their independence and safety with mobility to allow discharge to the venue listed below.       Follow Up Recommendations SNF    Equipment Recommendations  None recommended by PT    Recommendations for Other Services       Precautions / Restrictions Precautions Precautions: Fall Restrictions Weight Bearing Restrictions: No Other Position/Activity Restrictions: family reports many falls      Mobility  Bed Mobility Overal bed mobility: Needs Assistance Bed Mobility: Supine to Sit     Supine to sit: Mod assist     General bed mobility comments: cues for technique, increased time, assist to raise trunk and shift hips to EOB, posterior lean  Transfers Overall transfer level: Needs assistance Equipment used: Rolling walker (2 wheeled) Transfers: Sit to/from [facility] Sit to Stand: +2 physical assistance;Mod assist Stand pivot transfers: +2 physical assistance;Max assist       General transfer comment: Increased posterior lean with standing requiring verbal/tactile cueing to facilitate hip extension.  Significantly increased time and effort for stand pivot with manual assistance for RW required due to patient's difficulty with motor planning and initiation.  Ambulation/Gait                Stairs            Wheelchair Mobility    Modified Rankin (Stroke Patients Only)       Balance Overall balance assessment: Needs assistance   Sitting balance-Leahy Scale: Poor Sitting balance - Comments: posterior lean   Standing balance support: Bilateral upper extremity supported Standing balance-Leahy Scale: Poor Standing balance comment: B UE and external support required, posterior lean                             Pertinent Vitals/Pain Pain Assessment: No/denies pain    Home Living Family/patient expects to be discharged to:: Private residence Living Arrangements: Spouse/significant other;Children(son has a developmental disability) Available Help at Discharge: Family;Available 24 hours/day Type of Home: House Home Access: Ramped entrance     Home Layout: One level Home Equipment: Walker - 2 wheels;Cane - single point      Prior Function Level of Independence: Needs assistance   Gait / Transfers Assistance Needed: many falls, was walking with a cane or a walker prior to admission  ADL's / Homemaking Assistance Needed: assisted for ADL and IADL, self fed  Comments: 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     Hand Dominance   Dominant Hand: Right    Extremity/Trunk Assessment   Upper Extremity Assessment Upper Extremity Assessment: RUE deficits/detail;LUE deficits/detail RUE Deficits / Details: + tremor, generalized weakness RUE Coordination: decreased fine motor LUE Deficits / Details: + tremor, generalized weakness  LUE Coordination: decreased fine motor    Lower Extremity Assessment Lower Extremity Assessment: Generalized weakness       Communication   Communication: No difficulties  Cognition Arousal/Alertness: Awake/alert Behavior  During Therapy: WFL for tasks assessed/performed Overall Cognitive Status: History of cognitive impairments - at baseline                                        General Comments      Exercises     Assessment/Plan    PT Assessment Patient needs continued PT services  PT Problem List Decreased strength;Decreased activity tolerance;Decreased balance;Decreased mobility;Decreased coordination;Decreased cognition;Decreased knowledge of use of DME;Decreased safety awareness       PT Treatment Interventions DME instruction;Gait training;Functional mobility training;Therapeutic activities;Therapeutic exercise;Balance training;Patient/family education    PT Goals (Current goals can be found in the Care Plan section)  Acute Rehab PT Goals Patient Stated Goal: to get better PT Goal Formulation: With patient Time For Goal Achievement: 03/11/18 Potential to Achieve Goals: Fair    Frequency Min 2X/week   Barriers to discharge        Co-evaluation PT/OT/SLP Co-Evaluation/Treatment: Yes Reason for Co-Treatment: For patient/therapist safety;To address functional/ADL transfers;Necessary to address cognition/behavior during functional activity PT goals addressed during session: Mobility/safety with mobility;Proper use of DME OT goals addressed during session: ADL's and self-care       AM-PAC PT "6 Clicks" Daily Activity  Outcome Measure Difficulty turning over in bed (including adjusting bedclothes, sheets and blankets)?: Unable Difficulty moving from lying on back to sitting on the side of the bed? : Unable Difficulty sitting down on and standing up from a chair with arms (e.g., wheelchair, bedside commode, etc,.)?: Unable Help needed moving to and from a bed to chair (including a wheelchair)?: A Lot Help needed walking in hospital room?: Total Help needed climbing 3-5 steps with a railing? : Total 6 Click Score: 7    End of Session Equipment Utilized During  Treatment: Gait belt Activity Tolerance: Patient limited by fatigue Patient left: in chair;with call bell/phone within reach;with chair alarm set;with nursing/sitter in room Nurse Communication: Mobility status PT Visit Diagnosis: Unsteadiness on feet (R26.81);Other abnormalities of gait and mobility (R26.89);History of falling (Z91.81);Muscle weakness (generalized) (M62.81);Difficulty in walking, not elsewhere classified (R26.2)    Time: 6010-9323 PT Time Calculation (min) (ACUTE ONLY): 30 min   Charges:   PT Evaluation $PT Eval Moderate Complexity: 1 Mod     PT G Codes:        Ellamae Sia, PT, DPT Acute Rehabilitation Services  Pager: [place] 02/25/2018, 10:49 AM

## 2018-02-25 NOTE — Clinical Social Work Placement (Signed)
   CLINICAL SOCIAL WORK PLACEMENT  NOTE  Date:  02/25/2018  Patient Details  Name: Joseph Mora MRN: [id number] Date of Birth: May 25, 1933  Clinical Social Work is seeking post-discharge placement for this patient at the [place] level of care (*CSW will initial, date and re-position this form in  chart as items are completed):  Yes   Patient/family provided with [place] Work Department's list of facilities offering this level of care within the geographic area requested by the patient (or if unable, by the patient's family).  Yes   Patient/family informed of their freedom to choose among providers that offer the needed level of care, that participate in Medicare, Medicaid or managed care program needed by the patient, have an available bed and are willing to accept the patient.  Yes   Patient/family informed of [place]'s ownership interest in [facility] and [facility], as well as of the fact that they are under no obligation to receive care at these facilities.  PASRR submitted to EDS on 02/25/18     PASRR number received on 02/25/18     Existing PASRR number confirmed on       FL2 transmitted to all facilities in geographic area requested by pt/family on 02/25/18     FL2 transmitted to all facilities within larger geographic area on       Patient informed that his/her managed care company has contracts with or will negotiate with certain facilities, including the following:        Yes   Patient/family informed of bed offers received.  Patient chooses bed at [place], [place]     Physician recommends and patient chooses bed at      Patient to be transferred to [place], [place] on 02/25/18.  Patient to be transferred to facility by [facility]AR     Patient family notified on 02/25/18 of transfer.  Name of family member notified:  Daughter and spouse     PHYSICIAN Please sign FL2     Additional Comment:     _______________________________________________ Benard Halsted, [place] 02/25/2018, 4:10 PM

## 2018-02-25 NOTE — Discharge Summary (Deleted)
Physician Discharge Summary  SIAN JOLES DZH:[id number] DOB: February 25, 1933 DOA: 02/24/2018  PCP: Elby Showers, MD  Admit date: 02/24/2018 Discharge date: 02/25/2018  Admitted From: Home Disposition:  SNF  Recommendations for Outpatient Follow-up:  1. Follow up with PCP in 1-2 weeks 2. Please obtain BMP/CBC in one week   Home Health: No Equipment/Devices: None  Discharge Condition: Stable CODE STATUS: DNR Diet recommendation: Heart Healthy / Carb Modified    Brief/Interim Summary: Mr. Caster is a 84 year old who presented with mild confusion and decreased oral intake. Family initially contacted his PCP who referred the patient to the ED for further workup. In the emergency department, patient was noted to be mildly hypotensive with lactate of over 3.  Patient was given 1 L fluid bolus and repeat lactic acid normalized and blood pressure improved.   Discharge Diagnoses:  Toxic metabolic encephalopathy: Patient initially presented with worsening confusion but today is alert and oriented to person and place but disoriented to situation, this appears to be near the patents baseline.  Dehydration: Improved after IV hydration, encourage adequate oral intake upon discharge   Diabetes mellitus: CBG continues to remain stable, continue home 70/30 at discharge   CAD: No chest pain during hospitilization, continue home Lipitor   Hyperlipidemia: Continue home Lipitor   Hypertension: Initially soft on presentation likely secondary to dehydration but has improved following hydration, resume home lisinopril and Metoprolol upon discharge   Dementia: Patient is seen by Dr. Jannifer Franklin at [facility], continue home medication regiment. He appears to be nearing his baseline   Discharge Instructions     Allergies  Allergen Reactions  . Morphine And Related Itching  . Penicillins Hives    Has patient had a PCN reaction causing immediate rash, facial/tongue/throat swelling, SOB or  lightheadedness with hypotension: No Has patient had a PCN reaction causing severe rash involving mucus membranes or skin necrosis: No Has patient had a PCN reaction that required hospitalization: No Has patient had a PCN reaction occurring within the last 10 years: No If all of the above answers are "NO", then may proceed with Cephalosporin use.     Consultations:  None   Procedures/Studies: Ct Head Wo Contrast  Result Date: 02/24/2018 CLINICAL DATA:  Altered level of consciousness.  Memory disturbance. EXAM: CT HEAD WITHOUT CONTRAST TECHNIQUE: Contiguous axial images were obtained from the base of the skull through the vertex without intravenous contrast. COMPARISON:  05/29/2015.  MRI 06/01/2015. FINDINGS: Brain: No sign of acute or subacute infarction. There is generalized brain atrophy. 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 Cerebral hemispheres show chronic small-vessel ischemic changes of the deep and subcortical white matter. No cortical or large vessel territory infarction. No mass lesion, hemorrhage, hydrocephalus or extra-axial collection. Vascular: There is atherosclerotic calcification of the major vessels at the base of the brain. Skull: Negative Sinuses/Orbits: Clear sinuses. Old right medial wall lamina papyracea fracture. No acute orbital finding. Other: None IMPRESSION: No acute or reversible finding. Atrophy and chronic small-vessel ischemic changes, similar to the previous exams. Electronically Signed   By: Nelson Chimes M.D.   On: 02/24/2018 13:03   Mr Lumbar Spine Wo Contrast  Result Date: 02/02/2018 CLINICAL DATA:  84 year old male with chronic lumbar back pain, persistent for 6 weeks. Remote prior surgery about 20 years ago. EXAM: MRI LUMBAR SPINE WITHOUT CONTRAST TECHNIQUE: Multiplanar, multisequence MR imaging of the lumbar spine was performed. No intravenous contrast was administered. COMPARISON:  No prior lumbar imaging. FINDINGS:  Segmentation: Lumbar segmentation appears to be normal and will be designated as such for this report. Alignment: Mild straightening of lumbar lordosis. Mild retrolisthesis of L2 on L3 and L5 on S1. Vertebrae: Interbody fusion hardware susceptibility artifact at L3-L4 and L4-L5. Superimposed degenerative vertebral body endplate changes with a mix of mild acute marrow edema and chronic degenerative endplate marrow signal changes at L1-L2 and L2-L3. Background bone marrow signal is normal. Intact visible sacrum and SI joints. Conus medullaris and cauda equina: Conus extends to the L1 level. No lower spinal cord or conus signal abnormality. Paraspinal and other soft tissues: Postoperative changes to the posterior paraspinal soft tissues at L4 and L5. No postoperative fluid collection. Negative visible abdominal viscera. Disc levels: T11-T12: Negative. T12-L1:  Negative. L1-L2: Severe disc space loss with bulky circumferential disc osteophyte complex. Broad-based posterior and biforaminal involvement, slightly eccentric to the left. Mild facet and ligament flavum hypertrophy. Mild to moderate spinal stenosis and bilateral L1 neural foraminal stenosis. L2-L3: Severe disc space loss. Probable vacuum disc. Bulky circumferential disc osteophyte complex with broad-based posterior and biforaminal involvement. Mild facet and ligament flavum hypertrophy. Moderate spinal stenosis. Mild to moderate bilateral L2 neural foraminal stenosis. L3-L4: Prior interbody cage type implants and decompression. Right side foraminal granulation tissue. No definite stenosis. L4-L5: Prior interbody cage type implants and decompression. Bilateral neural foraminal granulation tissue greater on the right. No definite stenosis. L5-S1: Severe disc space loss with vacuum disc. Bulky posterior and lateral disc osteophyte complex with biforaminal involvement. Moderate facet hypertrophy. Epidural lipomatosis. Moderate overall spinal stenosis with when  considering the epidural fat. Mild to moderate bilateral lateral recess stenosis at the descending S1 nerve levels, greater on the right. Mild to moderate bilateral L5 neural foraminal stenosis. IMPRESSION: 1. Prior decompression and interbody fusion implant placement at L3-L4 and L4-L5. 2. Severe lumbar disc and endplate degeneration elsewhere with mild to moderate multifactorial spinal and bilateral neural foraminal stenosis. 3. Mild acute exacerbation of the chronic endplate degeneration at L1-L2 and L2-L3 with marrow edema. Electronically Signed   By: Genevie Ann M.D.   On: 02/02/2018 14:57   Dg Chest Portable 1 View  Result Date: 02/24/2018 CLINICAL DATA:  Weakness, dizziness for 2 days. Altered mental status EXAM: PORTABLE CHEST 1 VIEW COMPARISON:  10/14/2015 FINDINGS: Bibasilar atelectasis. Low lung volumes. Heart is normal size. No effusions or acute bony abnormality. IMPRESSION: Low lung volumes with bibasilar atelectasis. Electronically Signed   By: Rolm Baptise M.D.   On: 02/24/2018 12:51   Subjective: Patient resting in bed upon my assessment, he has no new complaints this am and is alert and oriented x2.   Discharge Exam: Vitals:   02/25/18 0510 02/25/18 1004  BP: (!) 142/66 140/60  Pulse: 94 90  Resp: 18   Temp: 98.6 F (37 C)   SpO2: 94%    Vitals:   02/24/18 1807 02/24/18 2210 02/25/18 0510 02/25/18 1004  BP: (!) 146/75 134/76 (!) 142/66 140/60  Pulse: 87 97 94 90  Resp: 20 18 18    Temp: 98.2 F (36.8 C) 98.1 F (36.7 C) 98.6 F (37 C)   TempSrc: Oral Oral Axillary   SpO2: 96% 95% 94%   Weight:      Height:       General: Pt is alert, awake, not in acute distress Cardiovascular: RRR, S1/S2 +, no rubs, no gallops Respiratory: CTA bilaterally, no wheezing, no rhonchi Abdominal: Soft, NT, ND, bowel sounds + Extremities: no edema, no cyanosis  The results of  significant diagnostics from this hospitalization (including imaging, microbiology, ancillary and laboratory) are  listed below for reference.     Microbiology: Recent Results (from the past 240 hour(s))  Urine culture     Status: None   Collection Time: 02/24/18  1:46 PM  Result Value Ref Range Status   Specimen Description URINE, RANDOM  Final   Special Requests NONE  Final   Culture   Final    NO GROWTH Performed at [facility], [address], [place], [place] [address]    Report Status 02/25/2018 FINAL  Final     Labs: BNP (last 3 results) No results for input(s): BNP in the last 8760 hours. Basic Metabolic Panel: Recent Labs  Lab 02/24/18 1225 02/24/18 1237 02/25/18 0704  NA 136 139 141  K 4.0 4.3 3.8  CL 100* 102 106  CO2 23  --  26  GLUCOSE 181* 179* 85  BUN 22* 28* 21*  CREATININE 1.42* 1.10 1.22  CALCIUM 8.9  --  8.4*   Liver Function Tests: Recent Labs  Lab 02/24/18 1225 02/25/18 0704  AST 29 26  ALT 30 20  ALKPHOS 103 82  BILITOT 1.2 1.1  PROT 7.0 5.6*  ALBUMIN 3.6 2.7*   No results for input(s): LIPASE, AMYLASE in the last 168 hours. No results for input(s): AMMONIA in the last 168 hours. CBC: Recent Labs  Lab 02/24/18 1225 02/24/18 1237 02/25/18 0704  WBC 11.1*  --  10.5  NEUTROABS 9.2*  --   --   HGB 15.7 16.0 14.1  HCT 48.0 47.0 43.0  MCV 94.3  --  95.3  PLT 145*  --  126*   Cardiac Enzymes: No results for input(s): CKTOTAL, CKMB, CKMBINDEX, TROPONINI in the last 168 hours. BNP: Invalid input(s): POCBNP CBG: Recent Labs  Lab 02/24/18 1217 02/24/18 2037 02/24/18 2159 02/25/18 0751  GLUCAP 168* 120* 105* 79   D-Dimer No results for input(s): DDIMER in the last 72 hours. Hgb A1c No results for input(s): HGBA1C in the last 72 hours. Lipid Profile No results for input(s): CHOL, HDL, LDLCALC, TRIG, CHOLHDL, LDLDIRECT in the last 72 hours. Thyroid function studies No results for input(s): TSH, T4TOTAL, T3FREE, THYROIDAB in the last 72 hours.  Invalid input(s): FREET3 Anemia work up No results for input(s): VITAMINB12, FOLATE,  FERRITIN, TIBC, IRON, RETICCTPCT in the last 72 hours. Urinalysis    Component Value Date/Time   COLORURINE YELLOW 02/24/2018 1346   APPEARANCEUR CLEAR 02/24/2018 1346   LABSPEC 1.024 02/24/2018 1346   PHURINE 5.0 02/24/2018 1346   GLUCOSEU 50 (A) 02/24/2018 1346   HGBUR NEGATIVE 02/24/2018 1346   BILIRUBINUR NEGATIVE 02/24/2018 1346   BILIRUBINUR negative 08/03/2017 1100   KETONESUR 20 (A) 02/24/2018 1346   PROTEINUR NEGATIVE 02/24/2018 1346   UROBILINOGEN 0.2 08/03/2017 1100   UROBILINOGEN 1.0 05/28/2015 0938   NITRITE NEGATIVE 02/24/2018 1346   LEUKOCYTESUR NEGATIVE 02/24/2018 1346   Sepsis Labs Invalid input(s): PROCALCITONIN,  WBC,  LACTICIDVEN Microbiology Recent Results (from the past 240 hour(s))  Urine culture     Status: None   Collection Time: 02/24/18  1:46 PM  Result Value Ref Range Status   Specimen Description URINE, RANDOM  Final   Special Requests NONE  Final   Culture   Final    NO GROWTH Performed at [facility], [place] [address], [place], [place] [address]    Report Status 02/25/2018 FINAL  Final     Time coordinating discharge: Over 23  minutes  SIGNED:   Johnsie Cancel, NP [facility] 02/25/2018, 11:57 AM Pager [phone]  If 7PM-7AM, please contact night-coverage [email or web address] Password [email or web address]

## 2018-02-26 ENCOUNTER — Encounter: Payer: Self-pay | Admitting: Podiatry

## 2018-02-26 DIAGNOSIS — G9341 Metabolic encephalopathy: Secondary | ICD-10-CM | POA: Diagnosis not present

## 2018-02-26 DIAGNOSIS — I2510 Atherosclerotic heart disease of native coronary artery without angina pectoris: Secondary | ICD-10-CM | POA: Diagnosis not present

## 2018-02-26 DIAGNOSIS — F0390 Unspecified dementia without behavioral disturbance: Secondary | ICD-10-CM | POA: Diagnosis not present

## 2018-02-26 DIAGNOSIS — E860 Dehydration: Secondary | ICD-10-CM | POA: Diagnosis not present

## 2018-02-26 DIAGNOSIS — E119 Type 2 diabetes mellitus without complications: Secondary | ICD-10-CM | POA: Diagnosis not present

## 2018-03-03 ENCOUNTER — Other Ambulatory Visit: Payer: Self-pay | Admitting: *Deleted

## 2018-03-03 NOTE — Patient Outreach (Signed)
[facility]) Care Management  03/03/2018  ADONYS WILDES [birth year] [id number]   Telephone Screen  Referral Date: 03/02/18 Referral Source: HTA referral Referral Reason: Member has a history of CAD, dementia, DM.  He is currently in [employer].  According to records, he lived at home alone but has active family members involved in his care  Insurance: HTA  Outreach attempt #1, successful Patient is able to verify HIPAA Reviewed and addressed referral to [facility] with patient's wife, [name] related to Mr Winterhalter's dementia and she is listed on [employer] (DPR)  Social: Mrs Obar reports that Mr Dudding lives at home with her but is presently at [facility] center (from February 25 2018) . Mrs Shein reports she was informed he was "approved to stay until today.  The lady was trying to get it extended" CM discussed that a call will be made to [facility] and CM will request Mrs Weaver be updated.  Mrs Knebel voiced appreciation of call from [facility] telephonic RN CM.  Previously [facility]  Conditions: history of CAD, dementia, insulin dependent DM, HTN, Hypoglycemia, Hyperlipidemia, chronic back pain  Consent: [facility] RN CM reviewed [facility] services with Mrs Grater who gave verbal consent for services.  Plan: [facility] RN CM will refer Mr Lamountain to [facility] to for snf placement monitoring and support services [facility] CM called [place], spoke will Ebony Hail and was informed that there will be a care plan meeting with Mr Sass wife soon but discharge is not planned for today.  Ebony Hail will call to updated Mrs Bonanno.  Kimberly L. Lavina Hamman, RN, BSN, CCM [facility] Telephonic Care Management Care Coordinator Direct number [phone]  Main [facility] number [phone] Fax number [phone]

## 2018-03-09 ENCOUNTER — Other Ambulatory Visit: Payer: Self-pay | Admitting: *Deleted

## 2018-03-09 NOTE — Patient Outreach (Signed)
[facility]) Care Management  03/09/2018  CYLAS FALZONE November 20, 1932 [id number]   CSW met with patient at [facility] today. Pt's identity was confirmed and CSW introduced self and role with [facility].  Pt reports he has been at [facility] rehab since last Monday (8 days) and thinks he may be getting close to going home.  Per pt, he lives with his wife of 4 years in [place]. CSW was also able to speak with SNF rep who shared that pt is progressing well with PT and walked 50 ft. They are anticipating he will dc home in the next week.  Pt also feels he will be able to return home with wife soon.   CSW provided pt with [facility] packet and will plan to touch base with pt/family later this week as well as SNF rep for updates on progress and dc plans.    Eduard Clos, MSW, [place] Worker  [place] [phone]

## 2018-03-10 ENCOUNTER — Emergency Department (HOSPITAL_COMMUNITY)
Admission: EM | Admit: 2018-03-10 | Discharge: 2018-03-10 | Disposition: A | Discharge status: Home / Self Care | Payer: PPO | Attending: Emergency Medicine | Admitting: Emergency Medicine

## 2018-03-10 ENCOUNTER — Emergency Department (HOSPITAL_COMMUNITY): Payer: PPO

## 2018-03-10 DIAGNOSIS — Z87891 Personal history of nicotine dependence: Secondary | ICD-10-CM | POA: Insufficient documentation

## 2018-03-10 DIAGNOSIS — R55 Syncope and collapse: Secondary | ICD-10-CM | POA: Diagnosis not present

## 2018-03-10 DIAGNOSIS — E119 Type 2 diabetes mellitus without complications: Secondary | ICD-10-CM | POA: Diagnosis not present

## 2018-03-10 DIAGNOSIS — I2510 Atherosclerotic heart disease of native coronary artery without angina pectoris: Secondary | ICD-10-CM | POA: Insufficient documentation

## 2018-03-10 DIAGNOSIS — Z794 Long term (current) use of insulin: Secondary | ICD-10-CM | POA: Insufficient documentation

## 2018-03-10 DIAGNOSIS — R52 Pain, unspecified: Secondary | ICD-10-CM

## 2018-03-10 DIAGNOSIS — S2231XA Fracture of one rib, right side, initial encounter for closed fracture: Secondary | ICD-10-CM | POA: Diagnosis not present

## 2018-03-10 DIAGNOSIS — F0390 Unspecified dementia without behavioral disturbance: Secondary | ICD-10-CM | POA: Insufficient documentation

## 2018-03-10 DIAGNOSIS — Z043 Encounter for examination and observation following other accident: Secondary | ICD-10-CM | POA: Diagnosis not present

## 2018-03-10 DIAGNOSIS — I252 Old myocardial infarction: Secondary | ICD-10-CM | POA: Diagnosis not present

## 2018-03-10 DIAGNOSIS — S79911A Unspecified injury of right hip, initial encounter: Secondary | ICD-10-CM | POA: Diagnosis not present

## 2018-03-10 DIAGNOSIS — I10 Essential (primary) hypertension: Secondary | ICD-10-CM | POA: Insufficient documentation

## 2018-03-10 DIAGNOSIS — Z79899 Other long term (current) drug therapy: Secondary | ICD-10-CM | POA: Insufficient documentation

## 2018-03-10 DIAGNOSIS — W19XXXA Unspecified fall, initial encounter: Secondary | ICD-10-CM

## 2018-03-10 DIAGNOSIS — M25551 Pain in right hip: Secondary | ICD-10-CM | POA: Diagnosis not present

## 2018-03-10 LAB — CBC WITH DIFFERENTIAL/PLATELET
Basophils Absolute: 0.8 10*3/uL — ABNORMAL HIGH (ref 0.0–0.1)
Basophils Relative: 1 %
EOS ABS: 2.9 10*3/uL — AB (ref 0.0–0.7)
EOS PCT: 3 %
HCT: 45.8 % (ref 39.0–52.0)
Hemoglobin: 15 g/dL (ref 13.0–17.0)
Lymphocytes Relative: 24 %
Lymphs Abs: 1.9 10*3/uL (ref 0.7–4.0)
MCH: 31.6 pg (ref 26.0–34.0)
MCHC: 32.8 g/dL (ref 30.0–36.0)
MCV: 96.6 fL (ref 78.0–100.0)
MONOS PCT: 8 %
Monocytes Absolute: 7.8 10*3/uL — ABNORMAL HIGH (ref 0.1–1.0)
NEUTROS ABS: 4.9 10*3/uL (ref 1.7–7.7)
Neutrophils Relative %: 64 %
PLATELETS: 110 10*3/uL — AB (ref 150–400)
RBC: 4.74 MIL/uL (ref 4.22–5.81)
RDW: 13 % (ref 11.5–15.5)
WBC: 7.8 10*3/uL (ref 4.0–10.5)

## 2018-03-10 LAB — BASIC METABOLIC PANEL
ANION GAP: 8 (ref 5–15)
BUN: 20 mg/dL (ref 6–20)
CO2: 26 mmol/L (ref 22–32)
Calcium: 8.5 mg/dL — ABNORMAL LOW (ref 8.9–10.3)
Chloride: 102 mmol/L (ref 101–111)
Creatinine, Ser: 1.37 mg/dL — ABNORMAL HIGH (ref 0.61–1.24)
GFR calc Af Amer: 53 mL/min — ABNORMAL LOW (ref >60)
GFR, EST NON AFRICAN AMERICAN: 46 mL/min — AB (ref >60)
Glucose, Bld: 225 mg/dL — ABNORMAL HIGH (ref 65–99)
POTASSIUM: 4.5 mmol/L (ref 3.5–5.1)
SODIUM: 136 mmol/L (ref 135–145)

## 2018-03-10 LAB — I-STAT TROPONIN, ED: TROPONIN I, POC: 0 ng/mL (ref 0.00–0.08)

## 2018-03-10 NOTE — ED Notes (Signed)
ED Provider at bedside. 

## 2018-03-10 NOTE — Discharge Instructions (Signed)
It was my pleasure taking care of you today!   Fortunately your CT scan and lab work were reassuring.  We did do an x-ray of your chest which showed that you possibly fractured your left 5th rib. Ice or heat to the affected area as needed. Tylenol as needed for additional pain relief.   Follow up with your primary care doctor.   Return to ER for new or worsening symptoms, any additional concerns.

## 2018-03-10 NOTE — ED Notes (Signed)
This RN spoke with [facility] RN (name unknown, Alice to take over shortly after phone call was over) RN requested AVS be faxed to facility because they did not receive paperwork when pt was dropped off. Report given to facility RN and AVS reprinted and faxed to [facility] @ [phone].

## 2018-03-10 NOTE — ED Provider Notes (Signed)
[name] EMERGENCY DEPARTMENT Provider Note   CSN: [id number] Arrival date & time: 03/10/18  1640     History   Chief Complaint No chief complaint on file.   HPI Joseph Mora is a 84 y.o. male.  The history is provided by the patient, the spouse and the nursing home. No language interpreter was used.   Joseph Mora is a 84 y.o. male  with a PMH of CAD with prior MI, DM, HLD who presents to the Emergency Department for syncopal episode vs. Fall at [facility] just prior to arrival. Per staff, patient was walking down the hall when he slid down to the floor. Staff unsure if he became weak and fell or if he had syncopal episode. No shaking / seizure-like activity. Per wife, she was told that he had a fall. She reports that he has been at [facility] since discharge from the hospital on 6/06 due to weakness and confusion although they could not find a physical reasons for his symptoms. Patient without any complaints although is unable to contribute to any history of events leading to hospital visit.   Level V caveat applies 2/2 dementia   Past Medical History:  Diagnosis Date  . Arthritis    RA  . CAD (coronary artery disease)   . Diabetes mellitus   . Hyperlipidemia   . Memory difficulty 06/22/2015  . Myocardial infarction ([place])   . Tremor 06/22/2015   Intentional, right hand    Patient Active Problem List   Diagnosis Date Noted  . Dehydration 02/24/2018  . Hypoglycemia 10/14/2015  . CAD in native artery   . Memory difficulty 06/22/2015  . Tremor 06/22/2015  . Premature ventricular contractions 06/22/2015  . Essential hypertension 05/23/2012  . Benign prostatic hyperplasia 04/21/2011  . Chronic back pain 04/21/2011  . Insulin dependent diabetes mellitus ([place]) 04/21/2011  . Hx of adenomatous colonic polyps 04/21/2011  . History of Helicobacter infection 04/21/2011  . CAD (coronary artery disease) 04/03/2011  . Hyperlipidemia 04/03/2011    Past Surgical  History:  Procedure Laterality Date  . BACK SURGERY    . CARPAL TUNNEL RELEASE Bilateral   . CATARACT EXTRACTION Bilateral   . INGUINAL HERNIA REPAIR    . PARATHYROIDECTOMY          Home Medications    Prior to Admission medications   Medication Sig Start Date End Date Taking? Authorizing Provider  insulin NPH-regular Human (NOVOLIN 70/30) (70-30) 100 UNIT/ML injection Inject 10-50 Units into the skin See admin instructions. Inject 40 units before breakfast, 10 units before lunch and 20 units before supper. (hold if CBG is <100)   Yes [provider]  memantine (NAMENDA) 10 MG tablet Take 1 tablet (10 mg total) by mouth 2 (two) times daily. 11/19/17  Yes Lataunya Ruud Givens, NP  metoprolol tartrate (LOPRESSOR) 50 MG tablet TAKE 1 TABLET BY MOUTH TWICE DAILY Patient taking differently: TAKE 1 TABLET(50 mg) BY MOUTH TWICE DAILY 05/15/17  Yes Baxley, Cresenciano Lick, MD  atorvastatin (LIPITOR) 40 MG tablet TAKE 1 TABLET BY MOUTH DAILY Patient not taking: Reported on 03/10/2018 09/11/17   Elby Showers, MD  donepezil (ARICEPT) 10 MG tablet TAKE 1 TABLET(10 MG) BY MOUTH AT BEDTIME Patient not taking: Reported on 03/10/2018 11/19/17   Tiyah Zelenak Givens, NP  lisinopril (PRINIVIL,ZESTRIL) 5 MG tablet Take 1 tablet (5 mg total) by mouth daily. Patient not taking: Reported on 03/10/2018 10/26/17 02/24/18  Burnell Blanks, MD  nitroGLYCERIN (NITROSTAT) 0.4 MG SL  tablet Place 1 tablet (0.4 mg total) under the tongue every 5 (five) minutes as needed for chest pain. Patient not taking: Reported on 03/10/2018 07/31/16   Elby Showers, MD  sertraline (ZOLOFT) 50 MG tablet Take 1 tablet (50 mg total) by mouth daily. Patient not taking: Reported on 03/10/2018 02/01/18   Elby Showers, MD  tamsulosin (FLOMAX) 0.4 MG CAPS capsule TAKE 1 CAPSULE BY MOUTH EVERY DAY Patient not taking: Reported on 03/10/2018 03/24/17   Elby Showers, MD  vitamin B-12 100 MCG tablet Take 1 tablet (100 mcg total) by mouth  daily. Patient not taking: Reported on 03/10/2018 06/01/15   Florencia Reasons, MD    Family History Family History  Problem Relation Age of Onset  . Heart attack Paternal Aunt   . Stroke Mother   . Diabetes Mother   . Dementia Mother   . Diabetes Sister   . Stroke Sister   . Dementia Sister   . Diabetes Brother   . Dementia Brother   . Hypertension Neg Hx     Social History Social History   Tobacco Use  . Smoking status: Former Smoker    Last attempt to quit: 08/03/2010    Years since quitting: 7.6  . Smokeless tobacco: Current User    Types: Chew  Substance Use Topics  . Alcohol use: No  . Drug use: No     Allergies   Morphine and related and Penicillins   Review of Systems Review of Systems  Unable to perform ROS: Dementia     Physical Exam Updated Vital Signs BP 126/82   Pulse 75   Temp 98.2 F (36.8 C) (Oral)   Resp (!) 25   SpO2 96%   Physical Exam  Constitutional: He appears well-developed and well-nourished. No distress.  HENT:  Head: Normocephalic and atraumatic. Head is without raccoon's eyes and without Battle's sign.  Right Ear: No hemotympanum.  Left Ear: No hemotympanum.  Nose: Nose normal.  Cardiovascular: Normal rate, regular rhythm and normal heart sounds.  No murmur heard. Pulmonary/Chest: Effort normal and breath sounds normal. No respiratory distress.  Abdominal: Soft. He exhibits no distension. There is no tenderness.  Musculoskeletal: Normal range of motion.  Tenderness to right hip and right lateral rib cage area. No tenderness to the left rib cage region. No overlying skin changes.   Neurological: He is alert.  Alert to person. Baseline mental status since hospital discharge per wife at bedside. Unable to provide history leading to today's ER visit. CN 2-12 grossly intact. Moves all four extremities independently.   Skin: Skin is warm and dry.  Nursing note and vitals reviewed.    ED Treatments / Results  Labs (all labs ordered  are listed, but only abnormal results are displayed) Labs Reviewed  CBC WITH DIFFERENTIAL/PLATELET - Abnormal; Notable for the following components:      Result Value   Platelets 110 (*)    Monocytes Absolute 7.8 (*)    Eosinophils Absolute 2.9 (*)    Basophils Absolute 0.8 (*)    All other components within normal limits  BASIC METABOLIC PANEL - Abnormal; Notable for the following components:   Glucose, Bld 225 (*)    Creatinine, Ser 1.37 (*)    Calcium 8.5 (*)    GFR calc non Af Amer 46 (*)    GFR calc Af Amer 53 (*)    All other components within normal limits  I-STAT TROPONIN, ED    EKG EKG Interpretation  Date/Time:  Wednesday March 10 2018 16:56:26 EDT Ventricular Rate:  73 PR Interval:    QRS Duration: 145 QT Interval:  404 QTC Calculation: 446 R Axis:   107 Text Interpretation:  Sinus rhythm Ventricular premature complex Prolonged PR interval RBBB and LPFB Baseline wander in lead(s) II III aVR aVL aVF Abnormal ekg Confirmed by Carmin Muskrat [phone]) on 03/10/2018 7:01:34 PM   Radiology Dg Ribs Unilateral W/chest Left  Result Date: 03/10/2018 CLINICAL DATA:  84 year old male status post fall today onto side. Pain. EXAM: LEFT RIBS AND CHEST - 3+ VIEW COMPARISON:  Chest radiograph 02/24/2018 and earlier. FINDINGS: Supine AP view of the chest at 1810 hours. Lung volumes are low normal and stable. Mediastinal contours remain normal. Right coronary artery vascular stent. Visualized tracheal air column is within normal limits. Allowing for portable technique the lungs are clear. Stable epigastric region surgical clips. Bone mineralization is within normal limits for age. Oblique views of the left ribs provided. Thoracic segmentation appears normal. There is a mildly displaced fracture of the left lateral 5th rib which appears age indeterminate. No other left rib fracture identified. Negative visible bowel gas pattern. Abdomen Calcified aortic atherosclerosis. Lumbar spine  degeneration, with 2 level interbody lumbar spine cage type fusion implants in place. IMPRESSION: 1. Mildly displaced left lateral 5th rib fracture is age indeterminate. Correlate for point tenderness which would corroborate an acute fracture. 2. No other acute traumatic injury or acute cardiopulmonary abnormality identified. 3.  Aortic Atherosclerosis (ICD10-I70.0). Electronically Signed   By: Genevie Ann M.D.   On: 03/10/2018 18:41   Ct Head Wo Contrast  Result Date: 03/10/2018 CLINICAL DATA:  Witnessed syncopal episode lasting 30 seconds. EXAM: CT HEAD WITHOUT CONTRAST TECHNIQUE: Contiguous axial images were obtained from the base of the skull through the vertex without intravenous contrast. COMPARISON:  02/24/2017 FINDINGS: BRAIN: There is sulcal and ventricular prominence consistent with superficial and central atrophy. Small rounded remote infarct left inferior cerebellum as before. No intraparenchymal hemorrhage, mass effect nor midline shift. Periventricular and subcortical white matter hypodensities consistent with chronic small vessel ischemic disease are identified. No acute large vascular territory infarcts. No abnormal extra-axial fluid collections. Basal cisterns are not effaced and midline. VASCULAR: Moderate calcific atherosclerosis of the carotid siphons. SKULL: No skull fracture. No significant scalp soft tissue swelling. SINUSES/ORBITS: The mastoid air-cells are clear. The included paranasal sinuses are well-aerated.The included ocular globes and orbital contents are non-suspicious. OTHER: None. IMPRESSION: Atrophy with chronic small vessel ischemia. No acute intracranial abnormality. Electronically Signed   By: Ashley Royalty M.D.   On: 03/10/2018 18:16   Dg Hip Unilat W Or Wo Pelvis 2-3 Views Right  Result Date: 03/10/2018 CLINICAL DATA:  84 year old male status post fall on to side with pain. EXAM: DG HIP (WITH OR WITHOUT PELVIS) 2-3V RIGHT COMPARISON:  Right hip series 01/08/2006 and earlier.  FINDINGS: Chronic two level lower lumbar spine interbody implants are stable since 2006. The pelvis appears stable and intact. Femoral heads are normally located. Hip joint spaces are stable and normal for age. Grossly intact proximal left femur. The proximal right femur appears stable and intact. Bilateral iliofemoral calcified atherosclerosis. Negative visible bowel gas pattern. IMPRESSION: No acute fracture or dislocation identified about the right hip or pelvis. Electronically Signed   By: Genevie Ann M.D.   On: 03/10/2018 18:43    Procedures Procedures (including critical care time)  Medications Ordered in ED Medications - No data to display   Initial Impression / Assessment and  Plan / ED Course  I have reviewed the triage vital signs and the nursing notes.  Pertinent labs & imaging results that were available during my care of the patient were reviewed by me and considered in my medical decision making (see chart for details).     *Joseph Mora is a 84 y.o. male who presents to ED for fall vs. Syncopal episode at facility just prior to arrival. Baseline mental status since hospital discharge per wife at bedside. No focal neuro deficits on exam. CT head negative. EKG reassuring. Trop negative. Afebrile, hemodynamically stable. Labs reviewed and baseline. CXR does show mildly displaced left lateral 5th rib fracture. No overlying skin changes. No appreciable tenderness. Possible this is new from fall, however given he appears asymptomatic, will recommend heat/ice to area as needed. Evaluation does not show pathology that would require ongoing emergent intervention or inpatient treatment. Discharged back to facility in stable condition.   Patient discussed with Dr. Vanita Panda who agrees with treatment plan.   Final Clinical Impressions(s) / ED Diagnoses   Final diagnoses:  Fall, initial encounter    ED Discharge Orders    None       Timaya Bojarski, Ozella Almond, PA-C 03/10/18 2051     Carmin Muskrat, MD 03/11/18 1341

## 2018-03-10 NOTE — ED Triage Notes (Addendum)
Pt arrived via [facility] from [facility] after a witnessed syncopal episode lasting approx 30 seconds, per staff. Pt was said to be walking down the hall when he was noted to pass out and was assisted to the floor by staff. No trauma noted at time of triage. Pt is alert but not oriented as he has a hx of dementia. EMS v/s 155/97, hr70, rr18, 97%ra, cbg 326.  DNR AT BEDSIDE

## 2018-03-12 ENCOUNTER — Ambulatory Visit: Payer: Self-pay | Admitting: *Deleted

## 2018-03-14 DIAGNOSIS — E119 Type 2 diabetes mellitus without complications: Secondary | ICD-10-CM | POA: Diagnosis not present

## 2018-03-14 DIAGNOSIS — I2510 Atherosclerotic heart disease of native coronary artery without angina pectoris: Secondary | ICD-10-CM | POA: Diagnosis not present

## 2018-03-14 DIAGNOSIS — G9341 Metabolic encephalopathy: Secondary | ICD-10-CM | POA: Diagnosis not present

## 2018-03-14 DIAGNOSIS — E860 Dehydration: Secondary | ICD-10-CM | POA: Diagnosis not present

## 2018-03-14 DIAGNOSIS — F0390 Unspecified dementia without behavioral disturbance: Secondary | ICD-10-CM | POA: Diagnosis not present

## 2018-03-19 ENCOUNTER — Telehealth: Payer: Self-pay | Admitting: Pharmacist

## 2018-03-19 ENCOUNTER — Other Ambulatory Visit: Payer: Self-pay | Admitting: *Deleted

## 2018-03-19 DIAGNOSIS — F329 Major depressive disorder, single episode, unspecified: Secondary | ICD-10-CM | POA: Diagnosis not present

## 2018-03-19 DIAGNOSIS — Z87440 Personal history of urinary (tract) infections: Secondary | ICD-10-CM | POA: Diagnosis not present

## 2018-03-19 DIAGNOSIS — E119 Type 2 diabetes mellitus without complications: Secondary | ICD-10-CM | POA: Diagnosis not present

## 2018-03-19 DIAGNOSIS — Z794 Long term (current) use of insulin: Secondary | ICD-10-CM | POA: Diagnosis not present

## 2018-03-19 DIAGNOSIS — E559 Vitamin D deficiency, unspecified: Secondary | ICD-10-CM | POA: Diagnosis not present

## 2018-03-19 DIAGNOSIS — E538 Deficiency of other specified B group vitamins: Secondary | ICD-10-CM | POA: Diagnosis not present

## 2018-03-19 DIAGNOSIS — I10 Essential (primary) hypertension: Secondary | ICD-10-CM | POA: Diagnosis not present

## 2018-03-19 DIAGNOSIS — E46 Unspecified protein-calorie malnutrition: Secondary | ICD-10-CM | POA: Diagnosis not present

## 2018-03-19 DIAGNOSIS — N401 Enlarged prostate with lower urinary tract symptoms: Secondary | ICD-10-CM | POA: Diagnosis not present

## 2018-03-19 DIAGNOSIS — Z7982 Long term (current) use of aspirin: Secondary | ICD-10-CM | POA: Diagnosis not present

## 2018-03-19 DIAGNOSIS — Z9181 History of falling: Secondary | ICD-10-CM | POA: Diagnosis not present

## 2018-03-19 DIAGNOSIS — I2510 Atherosclerotic heart disease of native coronary artery without angina pectoris: Secondary | ICD-10-CM | POA: Diagnosis not present

## 2018-03-19 DIAGNOSIS — N39498 Other specified urinary incontinence: Secondary | ICD-10-CM | POA: Diagnosis not present

## 2018-03-19 DIAGNOSIS — F0390 Unspecified dementia without behavioral disturbance: Secondary | ICD-10-CM | POA: Diagnosis not present

## 2018-03-19 DIAGNOSIS — G8929 Other chronic pain: Secondary | ICD-10-CM | POA: Diagnosis not present

## 2018-03-19 DIAGNOSIS — N3281 Overactive bladder: Secondary | ICD-10-CM | POA: Diagnosis not present

## 2018-03-19 DIAGNOSIS — M5136 Other intervertebral disc degeneration, lumbar region: Secondary | ICD-10-CM | POA: Diagnosis not present

## 2018-03-19 NOTE — Patient Outreach (Addendum)
[facility]) Care Management  03/19/2018  Joseph Mora 04-17-1933 [id number]   CSW was able to make contact with pt's wife today and confirmed he has been released from [facility]. "Some days are good and some are just ok". She is anticipating a HHRN visit today ([facility]) and indicates they have a son that lives in the home that helps out.  She denies any concerns related to transportation or psychosocial needs. She reports she was able to get all his RX's; stating, "I have had to put them up because he was taking them and I wasn't sure what he had taken".  CSW explained [facility] CM program and the disciplines involved. CSW will place consult for [facility] RN and [facility] outreach for screening and support.  CSW will close CSW consult at this time- please re-consult CSW if needs arise.  Eduard Clos, MSW, [place] Worker  [place] [phone]

## 2018-03-19 NOTE — Telephone Encounter (Signed)
-----   Message from Jiles Harold sent at 03/19/2018 10:25 AM EDT ----- Regarding: Referral: Order for [name]  Referral from Eduard Clos, LCSW  "Please see below request"  [name] ----- Message ----- From: Deirdre Peer, LCSW Sent: 03/19/2018   9:36 AM To: [facility] Cm Communication Orders Subject: Order for [name], [name]                       Patient Name: Joseph Mora, Joseph Mora([id number]) Sex: Male DOB: 23-Sep-1932    PCP: [place], [place]: [facility]   Types of orders made on 03/19/2018: Nursing  Order Date:03/19/2018 Ordering Dolores Patty [[id number]] Encounter Provider:Caldwell, Ranae Pila, LCSW [[id number]] Authorizing P rovider: Elby Showers, MD [[id number]] Department:[facility][[id number]]  Order Specific Information Order: Comm to Pharmacy [Custom: [id number]]  Order #: [id number] Qty: 1   Priority: Routine  Class: Clinic Performed   Comment:Pt was released from [facility]- now is home with wife and son and            [facility] RN to arrive today. I have a dc summary from SNF I can            share once assigned .             Per wife, she had to put the RX's up because he was taking them and             she wasn'[name] what/when, etc.             I felt this warranted an [facility] consult.     Priority: Routine  Class: Clinic Performed   Comment:Pt was released from [facility]- now is home with wife and son and            [facility] RN to arrive today. I have a dc summary from SNF I can            share once assigned.              Per wife, she had to put the RX's up because he was taking them and             she wasn'[name] what/when, etc.             I felt this warranted an [facility] consult.

## 2018-03-19 NOTE — Patient Outreach (Signed)
[facility]) Care Management  [place]   03/19/2018  Joseph Mora January 01, 1933 [id number]  Subjective: Patient was referred for medication management and review post discharge from SNF. HIPAA identifiers were obtained from the patient's wife [name]).  Patient is an 84 yo male with multiple medical conditions including but not limited to:  Hyperlipidemia, CAD, hypertension, dementia, CAD, BPH, insulin dependent diabetes, tremor and chronic back pain.  Patient was recently hospitalized for dehydration and metabolic encephalopathy (discharged 02/25/18 to [facility]).  He visited the ED from the facility 03/10/18 due to a syncopal episode.  He was discharged to home from [facility] on 03/14/18.  Patient attempted to manage his medications on his own but his wife reported he got everything confused.  She is now handling his medications and said she did not need further intervention.  (A medication review was completed).  Objective:   Encounter Medications: Outpatient Encounter Medications as of 03/19/2018  Medication Sig  . aspirin (ASPIRIN 81) 81 MG EC tablet Take 81 mg by mouth daily. Swallow whole.  Marland Kitchen atorvastatin (LIPITOR) 40 MG tablet TAKE 1 TABLET BY MOUTH DAILY  . donepezil (ARICEPT) 10 MG tablet TAKE 1 TABLET(10 MG) BY MOUTH AT BEDTIME  . insulin NPH-regular Human (NOVOLIN 70/30) (70-30) 100 UNIT/ML injection Inject 50 u units before breakfast, 10 units before lunch and 30 units before supper. (hold if CBG is <100)  . memantine (NAMENDA) 10 MG tablet Take 1 tablet (10 mg total) by mouth 2 (two) times daily.  . metoprolol tartrate (LOPRESSOR) 50 MG tablet TAKE 1 TABLET BY MOUTH TWICE DAILY (Patient taking differently: TAKE 1 TABLET(50 mg) BY MOUTH TWICE DAILY)  . nitroGLYCERIN (NITROSTAT) 0.4 MG SL tablet Place 1 tablet (0.4 mg total) under the tongue every 5 (five) minutes as needed for chest pain.  Marland Kitchen sertraline (ZOLOFT) 50 MG tablet Take 1 tablet (50 mg total) by mouth  daily.  . tamsulosin (FLOMAX) 0.4 MG CAPS capsule TAKE 1 CAPSULE BY MOUTH EVERY DAY  . vitamin B-12 100 MCG tablet Take 1 tablet (100 mcg total) by mouth daily.  Marland Kitchen lisinopril (PRINIVIL,ZESTRIL) 5 MG tablet Take 1 tablet (5 mg total) by mouth daily. (Patient not taking: Reported on 03/10/2018)   No facility-administered encounter medications on file as of 03/19/2018.     Functional Status: In your present state of health, do you have any difficulty performing the following activities: 08/03/2017  Hearing? Y  Vision? N  Difficulty concentrating or making decisions? Y  Walking or climbing stairs? Y  Dressing or bathing? N  Doing errands, shopping? Y  Some recent data might be hidden    Fall/Depression Screening: Fall Risk  08/03/2017 11/26/2016 07/31/2016  Falls in the past year? No No No  Number falls in past yr: - - -  Risk for fall due to : - - -   PHQ 2/9 Scores 03/03/2018 08/03/2017 07/31/2016 02/20/2016 01/29/2016 05/11/2014 05/11/2014  PHQ - 2 Score 0 0 2 0 0 0 0      Assessment:  ASSESSMENT: Date Discharged from Hospital: 02/25/18 Date Medication Reconciliation Performed: 03/19/2018  Medications Discontinued at Discharge:   Doxazosin 2mg   No new medications were prescribed at discharge.  Patient was recently discharged from hospital and all medications have been reviewed   The discharge medication list from [facility] was reviewed and the patient's medications were reviewed via telephone with his wife:  Drugs sorted by system:  Neurologic/Psychologic: Sertraline Donepezil Memantine  Cardiovascular: Lisinopril ASA  Metoprolol Nitroglycerin Atorvastatin  Endocrine: Novolin 70/30 Renal:  Urology: Myrbetriq  Doxazosin- was discontinued at discharge but he was still taking. Patient's wife said they were not told to stop. She was educated that doxazosin was discontinued at the 02/25/18 discharge from the hospital and was not listed on the discharge medication list  from [place].  Vitamins/Minerals: Cyanocobalamin   Infectious Disease: Nitrofurantion-100mg  on both the discharge summary from the hospital and the facility but the patient's wife reported he is not taking this.   PLAN: -route note to patient's PCP.  According to the patient's wife, he has an appointment with Dr. Renold Genta on Monday 03/22/18  -Close patient's case as his wife says she can manage his medications on his own and she has put them where he cannot get to them.  -Patient's wife communicated understanding that she can call me at any time in the future with medication related questions or concerns.   Elayne Guerin, PharmD, [place] Clinical Pharmacist [phone]

## 2018-03-19 NOTE — Telephone Encounter (Signed)
Thanks. Not aware of Nitrofurantoin Rx. We have had conversations about Doxazocin previously. Had been prescribed by his former cardiologist Dr. Rollene Fare for cardiac reasons.

## 2018-03-22 ENCOUNTER — Other Ambulatory Visit: Payer: Self-pay

## 2018-03-22 ENCOUNTER — Encounter: Payer: Self-pay | Admitting: Internal Medicine

## 2018-03-22 ENCOUNTER — Ambulatory Visit (INDEPENDENT_AMBULATORY_CARE_PROVIDER_SITE_OTHER): Payer: PPO | Admitting: Internal Medicine

## 2018-03-22 VITALS — BP 102/62 | HR 64 | Temp 98.1°F | Ht 64.0 in | Wt 184.0 lb

## 2018-03-22 DIAGNOSIS — Z794 Long term (current) use of insulin: Secondary | ICD-10-CM | POA: Diagnosis not present

## 2018-03-22 DIAGNOSIS — M48061 Spinal stenosis, lumbar region without neurogenic claudication: Secondary | ICD-10-CM | POA: Diagnosis not present

## 2018-03-22 DIAGNOSIS — H903 Sensorineural hearing loss, bilateral: Secondary | ICD-10-CM | POA: Diagnosis not present

## 2018-03-22 DIAGNOSIS — IMO0001 Reserved for inherently not codable concepts without codable children: Secondary | ICD-10-CM

## 2018-03-22 DIAGNOSIS — R5383 Other fatigue: Secondary | ICD-10-CM

## 2018-03-22 DIAGNOSIS — I10 Essential (primary) hypertension: Secondary | ICD-10-CM | POA: Diagnosis not present

## 2018-03-22 DIAGNOSIS — R296 Repeated falls: Secondary | ICD-10-CM

## 2018-03-22 DIAGNOSIS — E785 Hyperlipidemia, unspecified: Secondary | ICD-10-CM

## 2018-03-22 DIAGNOSIS — E119 Type 2 diabetes mellitus without complications: Secondary | ICD-10-CM | POA: Diagnosis not present

## 2018-03-22 DIAGNOSIS — R413 Other amnesia: Secondary | ICD-10-CM | POA: Diagnosis not present

## 2018-03-22 DIAGNOSIS — I519 Heart disease, unspecified: Secondary | ICD-10-CM

## 2018-03-22 MED ORDER — BUPROPION HCL ER (XL) 150 MG PO TB24: 150.0000 mg | ORAL_TABLET | Freq: Every day | ORAL | 2 refills | Status: DC

## 2018-03-22 NOTE — Progress Notes (Signed)
Subjective:    Patient ID: Joseph Mora, male    DOB: 1932-10-17, 84 y.o.   MRN: [id number]  HPI 84 year old Male in today for follow-up of recent hospitalization June 5 after which he was transferred to [place] center around June 6.  He subsequently suffered a fall June 19 at the nursing facility requiring ED evaluation.  Apparently discharged  from [facility] on June 23.  He is now back at home.    He was admitted to [facility] June 5 with a history of 2 or 3 days of confusion and weakness prior to his hospitalization.  He apparently had decreased p.o. intake to 3 days prior to hospitalization.  Patient's lactate was over 3 and he was noted to be mildly hypotensive.  He was given IV fluids.  He was felt to have volume depletion.  He has a history of insulin-dependent diabetes mellitus but his glucose was stable and he was not in DKA.  He has a history of dementia and has been followed by [facility] neurology, Dr. Jannifer Mora.  Family wishes a DNR be in place.  [facility] will be reaching out to him and wife would like for him to have a hospital bed.  We will see that that is ordered.  Apparently he had multiple falls prior to his hospitalization.  Current issue is that he basically sits all day and does not want to bathe according to his wife.  She feels that they cannot afford him to be in a nursing home full-time.  They have a foster son whom they adopted that resides with [name] but he is mentally challenged.  He does help some around the house.  TSH B12 and folate were checked today and are within normal limits.  Creatinine was elevated on June 19 at 1.37.  In May hemoglobin A1c had increased from 7.5% several months previously to 10.1%.     Review of Systems see above     Objective:   Physical Exam He is alert and he knows me.  Not really oriented to day of the week and year  Skin warm and dry.  Nodes none.  Neck is supple.  Chest clear.  Cardiac exam regular rate and  rhythm.  No lower extremity edema.  He complains of chronic back pain which is long-standing.       Assessment & Plan:  History of frequent falls  Dementia-once again a check B12 TSH and folate levels which proved to be within normal limits  Insulin-dependent diabetes mellitus  Recent admission for volume depletion  Plan: Wife is going to attempt to manage him at home although I think this is going to be challenging.  A hospital bed has been ordered.  He will follow-up here in 1 month.  Explained to wife that I cannot really make him understand that he needs to bathe on a daily basis or try to be active about the house.  I think his dementia is rather severe.    He has long-standing history of lumbar spinal stenosis and is status post surgery with interbody cage noted at L3-L4 and L4-L5.  He has severe lumbar disc and endplate degeneration in the lumbar spine with mild to moderate multifactorial spinal and bilateral neuroforaminal stenosis.  He is not felt to be a candidate for more surgery.  This is based on MRI of May 2019.   CT scan of the brain on June 5 showed no sign of acute or subacute infarction but  there was generalized brain atrophy and old small vessel infarction within the inferior cerebellum on the left.  Chronic small vessel ischemic changes noted in the cerebral hemispheres.  Plan: Hemoglobin A1c to be checked in August.  He has an appointment to see nurse practitioner at [facility] neurology in late August.

## 2018-03-22 NOTE — Patient Outreach (Signed)
[facility]) Care Management  03/22/2018  AMAZIAH RAISANEN 1933/06/05 [id number]  Referral received 03/19/18 per Eduard Clos, LCSW: client discharged from [facility] skilled facility to home.  RNCM called regarding transition of care. Spoke with [name] as client with dementia. She reports that client has seen primary care provider today. She denies any questions today.  Plan: RNCM will complete home visit next week.  Thea Silversmith, RN, MSN, [place] Coordinator Cell: [phone]

## 2018-03-23 ENCOUNTER — Telehealth: Payer: Self-pay

## 2018-03-23 DIAGNOSIS — M545 Low back pain: Secondary | ICD-10-CM | POA: Diagnosis not present

## 2018-03-23 DIAGNOSIS — M546 Pain in thoracic spine: Secondary | ICD-10-CM | POA: Diagnosis not present

## 2018-03-23 DIAGNOSIS — M47816 Spondylosis without myelopathy or radiculopathy, lumbar region: Secondary | ICD-10-CM | POA: Diagnosis not present

## 2018-03-23 DIAGNOSIS — G8929 Other chronic pain: Secondary | ICD-10-CM | POA: Diagnosis not present

## 2018-03-23 DIAGNOSIS — M5136 Other intervertebral disc degeneration, lumbar region: Secondary | ICD-10-CM | POA: Diagnosis not present

## 2018-03-23 DIAGNOSIS — M549 Dorsalgia, unspecified: Secondary | ICD-10-CM | POA: Diagnosis not present

## 2018-03-23 LAB — VITAMIN B12: VITAMIN B 12: 1216 pg/mL — AB (ref 200–1100)

## 2018-03-23 LAB — FOLATE: FOLATE: 8.5 ng/mL

## 2018-03-23 LAB — TSH: TSH: 2.02 m[IU]/L (ref 0.40–4.50)

## 2018-03-23 NOTE — Telephone Encounter (Signed)
Brittney from [facility] called to le [place] know that patient fell again today he tripped on his shoelace, he had no injuries. Call back number [phone].

## 2018-03-23 NOTE — Telephone Encounter (Signed)
Please put in referral for Palliative Care

## 2018-03-26 ENCOUNTER — Other Ambulatory Visit: Payer: Self-pay | Admitting: Internal Medicine

## 2018-03-29 NOTE — Telephone Encounter (Signed)
Referral faxed to [facility] on 03/23/18 @ [phone].  Placing referral in [facility] system as well.

## 2018-03-30 ENCOUNTER — Other Ambulatory Visit: Payer: Self-pay

## 2018-03-30 ENCOUNTER — Telehealth: Payer: Self-pay | Admitting: Internal Medicine

## 2018-03-30 NOTE — Telephone Encounter (Signed)
Thea Silversmith, RN, MSA with [facility] did a visit with patient and his wife.  Patient is continuing to take Lisinopril 5mg .  Denton Brick states that it appears that patient should no longer be taking this medication per Epic.  She wants to verify if he should nor should NOT be taking this??    Patients BP today is 122/68.  Medication shows expired in Epic. Your note has not been dictated, so I'm not sure from his last visit here in the office.    I am to let Denton Brick know and Mrs. Grammatico know whether patient is to continue to take the medication or not.    Thank you.    Denton Brick phone #:  [phone] Patient phone #:  [phone]

## 2018-03-30 NOTE — Telephone Encounter (Signed)
Have called Thea Silversmith and [name]. Ok to D/C lisinopril. 5 mg dose was likely for renoprotection for DM rather than BP control. He has had elevated creatinine at times recently due to volume depletion. It is OK to stop this med.

## 2018-03-30 NOTE — Patient Outreach (Addendum)
[place] [facility]) Care Management   03/30/2018  Joseph Mora 09/04/1933 [id number]  Joseph Mora is an 84 y.o. male  Subjective: Client reports he is doing better, he states that the rehabilitation has helped him.  Objective:  BP 122/68   Pulse 72   Resp 20   Ht 1.638 m (5' 4.5") Comment: patient reported.  Wt 185 lb (83.9 kg) Comment: patient reported.  SpO2 98%   BMI 31.26 kg/m   Review of Systems  Respiratory:       Decreased breath sounds.  Cardiovascular:       Regular heart sounds. Trace edema non pitting noted to lower leg/ankle area.    Physical Exam skin warm dry, color within normal limits.  Encounter Medications:   Outpatient Encounter Medications as of 03/30/2018  Medication Sig Note  . aspirin (ASPIRIN 81) 81 MG EC tablet Take 81 mg by mouth daily. Swallow whole.   Marland Kitchen atorvastatin (LIPITOR) 40 MG tablet TAKE 1 TABLET BY MOUTH DAILY   . buPROPion (WELLBUTRIN XL) 150 MG 24 hr tablet Take 1 tablet (150 mg total) by mouth daily.   Marland Kitchen donepezil (ARICEPT) 10 MG tablet TAKE 1 TABLET(10 MG) BY MOUTH AT BEDTIME   . insulin NPH-regular Human (NOVOLIN 70/30) (70-30) 100 UNIT/ML injection Inject 50 u units before breakfast, 10 units before lunch and 30 units before supper. (hold if CBG is <100)   . memantine (NAMENDA) 10 MG tablet Take 1 tablet (10 mg total) by mouth 2 (two) times daily.   . metoprolol tartrate (LOPRESSOR) 50 MG tablet TAKE 1 TABLET BY MOUTH TWICE DAILY (Patient taking differently: TAKE 1 TABLET(50 mg) BY MOUTH TWICE DAILY)   . mirabegron ER (MYRBETRIQ) 50 MG TB24 tablet Take 50 mg by mouth daily.   . nitroGLYCERIN (NITROSTAT) 0.4 MG SL tablet Place 1 tablet (0.4 mg total) under the tongue every 5 (five) minutes as needed for chest pain.   Marland Kitchen sertraline (ZOLOFT) 50 MG tablet Take 1 tablet (50 mg total) by mouth daily.   . tamsulosin (FLOMAX) 0.4 MG CAPS capsule TAKE 1 CAPSULE BY MOUTH EVERY DAY   . vitamin B-12 100 MCG tablet Take 1 tablet (100  mcg total) by mouth daily. 03/30/2018: Bottle reads 1034mg/tablet  . lisinopril (PRINIVIL,ZESTRIL) 5 MG tablet Take 1 tablet (5 mg total) by mouth daily. (Patient not taking: Reported on 03/10/2018) 03/30/2018: Has 5 mg tabets reports taking one tablet daily.   No facility-administered encounter medications on file as of 03/30/2018.     Functional Status:   In your present state of health, do you have any difficulty performing the following activities: 03/30/2018 08/03/2017  Hearing? YTempie Donning Vision? N N  Difficulty concentrating or making decisions? N Y  Walking or climbing stairs? Y Y  Dressing or bathing? N N  Doing errands, shopping? YTempie Donning Preparing Food and eating ? N -  Using the Toilet? N -  In the past six months, have you accidently leaked urine? Y -  Do you have problems with loss of bowel control? N -  Managing your Medications? Y -  Managing your Finances? Y -  Housekeeping or managing your Housekeeping? Y -  Some recent data might be hidden    Fall/Depression Screening:    Fall Risk  03/30/2018 08/03/2017 11/26/2016  Falls in the past year? Yes No No  Number falls in past yr: 2 or more - -  Injury with Fall? No - -  Risk Factor  Category  High Fall Risk - -  Risk for fall due to : Impaired balance/gait;History of fall(s) - -   PHQ 2/9 Scores 03/30/2018 03/03/2018 08/03/2017 07/31/2016 02/20/2016 01/29/2016 05/11/2014  PHQ - 2 Score 0 0 0 2 0 0 0    Assessment:  84 year old with history of dementia, CAP, diabetes, chronic back pain. Recent admission to hospital per wife, client was "totally out of it, did not know where he was and kept falling" so he went to the hospital. She reports client was treated for dehydration. From hospital was discharged to [place] for rehabilitation and discharged from [place] on 6/23 to home.   Home visit completed. Present during visit was his wife. [name] states he is better, he states he ambulates with his walker. He states he has not fallen since returning to  home. Fall prevention strategies discussed.  They have taken in over 100 foster children and adopted 3 children in addition to four of their own. She states that the children do not live far and one of the children who has disabilities lives with them.  Medications reviewed. [name] manages clients medications. She has not questions or concerns. RNCM noted-Client is taking Lisinopril 38m daily. Not listed as active in chart. Primary care provider contacted for clarification. Automatic blood pressure cuff discussed. Mrs. BSedlerstates they do not have a Blood pressure machine in the home.She states she is able to get an automatic cuff.  History of diabetes. Per [name] diabetes managed by Dr. BChalmers Cater Blood sugars for the past 7 days range 81-389. Client acknowledges he gets up and snacks during the night. Per [name], last A1C 7.5.   History of chronic low back pain. Client reports this is an ongoing issue. He states he usually takes Advil and reports it helps relieve pain and laying down helps relieve the pain.  [name] voicing no questions or concerns at this time.  24 hour nurse advice line provided and encouraged Mrs. BOwens Sharkto call as needed. RNCM contact name and number provided and encouraged to call as needed.  Plan: continue to follow for transition of care/care management needs. RNCM will call to follow up telephonically next week. [facility] CM Care Plan Problem One     Most Recent Value  Care Plan Problem One  at risk for readmission as evidence by recent admission  Role Documenting the Problem One  Care Management CFreerfor Problem One  Active  [facility] Goal   client will not be readmitted within the next 31 days.  [facility] Long Term Goal Start Date  03/22/18  Interventions for Problem One Long Term Goal  medications reviewed, RNCM called primary care for clarification, provided thn calender/organizer and explained how to use.  [facility] CM Short Term Goal #1    client will verbalize participation with ome health agency within the next 1-2 weeks.  [facility] CM Short Term Goal #1 Start Date  03/22/18  [facility] CM Short Term Goal #1 Met Date  03/30/18  [facility] CM Short Term Goal #2   family will call RNCM or 24 hour nurse advice line as needed within the next 30 days.  [facility] CM Short Term Goal #2 Start Date  03/22/18  Interventions for Short Term Goal #2  RNCM encouraged wife/client to call as needed, contact number provided again.    [facility] Plan Problem Two     Most Recent Value  Care Plan Problem Two  at risk to fall as evidence by history of falls.  Role Documenting the Problem Two  Care Management Coordinator  Care Plan for Problem Two  Active  [facility] CM Short Term Goal #1   client/family will verbalize at least three fall prevention strategies in the next30 days.  [facility] CM Short Term Goal #1 Start Date  03/30/18  Interventions for Short Term Goal #2   provided and discussed EMMI article on fall prevention.     Thea Silversmith, RN, MSN, [facility] [place] Coordinator Cell: [phone]  Addendum: Social work referral: potential for care-giver burnout as well as future family planning needs.

## 2018-03-30 NOTE — Patient Outreach (Signed)
[place] [facility]) Care Management  03/30/2018  Joseph Mora 1932/12/23 [id number]   Care Coordination: Return call from primary care provider with clarification that client is to stop the  Lisinopril. Primary care also reports [name] expressed concern to her about being overwhelmed with caring for client as well as her other family obligations.   Plan: RNCM will update [facility] Social worker regarding: potential for care-giver burnout as well as future family planning needs.  Thea Silversmith, RN, MSN, [place] Coordinator Cell: [phone]

## 2018-03-30 NOTE — Telephone Encounter (Signed)
Spoke with [name] and advised of Dr. Verlene Mayer message below.  She verbalized understanding of this conversation.

## 2018-03-31 ENCOUNTER — Telehealth: Payer: Self-pay

## 2018-03-31 ENCOUNTER — Other Ambulatory Visit: Payer: Self-pay | Admitting: Licensed Clinical Social Worker

## 2018-03-31 NOTE — Addendum Note (Signed)
Addended by: Luretha Rued on: 03/31/2018 12:45 PM   Modules accepted: Orders

## 2018-03-31 NOTE — Telephone Encounter (Signed)
Phone call placed to patient to offer to schedule visit with Palliative care. Phone rang with no answer

## 2018-03-31 NOTE — Patient Outreach (Signed)
[place] [facility]) Care Management  03/31/2018  Joseph Mora [birth year] [id number]   [facility] CSW received new referral from [place] on 03/31/18 as PCP expresses concern around spouse's ability to provide care and support to patient. PCP shared that patient's spouse expressed feeling overwhelmed with caring for client as well as with her other family obligations. [facility] CSW completed call to patient's residence to provide potential care-giver burnout resources and support as well as to discuss patient's long term care plan. Spouse answered and provided HIPPA verifications. [facility] CSW introduced self, reason for call and of [facility] social work services. Patient's spouse reports that she appreciates call from [facility] CSW but that she is not overwhelmed and that she feels that she is managing patient's care just fine and does not need any additional resources or assistance at this time. Spouse did allow [facility] CSW to provide community resource education on the PACE program, LTC Placement (Skilled nursing or ALF),  In [place], [place] and it's application process and local caregiver support groups. Patient appreciative of education but again denies needing these resources and denies wanting [facility] CSW to mail these resources out to her for her to keep or to review them in person during a home visit. Spouse reports only needing nursing services with [facility] [facility] and denies any social work needs at this time. [facility] CSW will not open case at this time as family has refused [facility] social work assistance. However, spouse is agreeable to contact this [facility] CSW in the case that she changes her mind. [facility] CSW will sign off at this time.  Eula Fried, BSW, MSW, [email or web address] Phone: [phone] Fax: [phone]

## 2018-04-01 ENCOUNTER — Telehealth: Payer: Self-pay

## 2018-04-01 NOTE — Telephone Encounter (Signed)
Nurse from [facility] called to let you know that patient fell this morning he had no injuries and he is doing PT.

## 2018-04-06 ENCOUNTER — Telehealth: Payer: Self-pay

## 2018-04-06 NOTE — Telephone Encounter (Signed)
Phone call placed to patient's wife to schedule a visit with Palliative Care. Verified address. Visit scheduled for 04/08/18

## 2018-04-08 ENCOUNTER — Other Ambulatory Visit: Payer: Self-pay

## 2018-04-08 ENCOUNTER — Encounter: Payer: Self-pay | Admitting: Internal Medicine

## 2018-04-08 ENCOUNTER — Other Ambulatory Visit: Payer: PPO | Admitting: Internal Medicine

## 2018-04-08 DIAGNOSIS — N401 Enlarged prostate with lower urinary tract symptoms: Secondary | ICD-10-CM | POA: Diagnosis not present

## 2018-04-08 DIAGNOSIS — R413 Other amnesia: Secondary | ICD-10-CM

## 2018-04-08 DIAGNOSIS — N39498 Other specified urinary incontinence: Secondary | ICD-10-CM | POA: Diagnosis not present

## 2018-04-08 DIAGNOSIS — M545 Low back pain: Secondary | ICD-10-CM | POA: Diagnosis not present

## 2018-04-08 DIAGNOSIS — G8929 Other chronic pain: Secondary | ICD-10-CM | POA: Diagnosis not present

## 2018-04-08 DIAGNOSIS — E559 Vitamin D deficiency, unspecified: Secondary | ICD-10-CM | POA: Diagnosis not present

## 2018-04-08 DIAGNOSIS — E538 Deficiency of other specified B group vitamins: Secondary | ICD-10-CM

## 2018-04-08 DIAGNOSIS — E119 Type 2 diabetes mellitus without complications: Secondary | ICD-10-CM | POA: Diagnosis not present

## 2018-04-08 DIAGNOSIS — I2510 Atherosclerotic heart disease of native coronary artery without angina pectoris: Secondary | ICD-10-CM | POA: Diagnosis not present

## 2018-04-08 DIAGNOSIS — F0390 Unspecified dementia without behavioral disturbance: Secondary | ICD-10-CM | POA: Diagnosis not present

## 2018-04-08 DIAGNOSIS — I10 Essential (primary) hypertension: Secondary | ICD-10-CM | POA: Diagnosis not present

## 2018-04-08 NOTE — Progress Notes (Signed)
PALLIATIVE CARE CONSULT VISIT   PATIENT NAME: Joseph Mora DOB: 20-Jan-1933 MRN: [id number]  PRIMARY CARE PROVIDER:   Elby Showers, MD  REFERRING PROVIDER:  Elby Showers, MD [address] [place], [place] [address]  REFERRING PROVIDER: DR. Tommie Ard Baxley IM. [facility] (PT/OT Lysbeth Galas [place])/ST signed off); LPN Karsten Fells [place]) [phone]; Last 7/11 Family: Wife Ranen Doolin Has HCPOA ([phone]  IMPRESSIONS/RECOMMENDATIONS:  1.Dementia with behavioral disturbances (FAST 6d if use of Alzheimer's dementia scale). Dependent for some ADLs (dressing/bathing/toileting; often incontinent of urine). Able to feed himself. Had recent overnight hospital stay for confusion/dehydration, with subsequent 3 week rehab at [place] (discharged June 23rd). PCG wife [name] notes patient was more active / less confused prior to hospitalization; able to be out and about in the yard. But progressively confused; getting lost even within his own home. Sometimes needs assist to stand from bed/chair. Needs walker for balance/gait stability; not always using consistently. Sometimes drags it after him. Often needs verbal cuing to sit. [place] 04/01/18; no injuries. Was being followed by home PT but discharged as patient not able to participate. When frustrated patient become verbally / physically aggressive (happens 2-3 x/d). [name] reports patient with increased somnolence; awake only about 4 hours total / day.  A. Consider initiation low dose Risperidol 0.25mg  qhs; monitor for improvement aggressive behaviors.   2. Low back pain; chronic: Per PCG, past evaluation (including MRI) with Dr. Rita Ohara (Neurosurg) was unrevealing. Adequate management with Advil bid.  3. Care Management: Through guidance from [facility], has obtained a hospital bed (to be delivered in a few days), grab bars, and elevated toilet seat. [facility] LCSW offered her availability to help guide [name] with recommendations for  other community resources. [name] defers for now, but she admits that she might be open to patient placement in LTC if continued deterioration. Patient enjoyed his recent stay at [facility]; his brother is a resident there.  4. Advanced Care Planning: DNR and MOST forms reviewed / completed / with PCG wife Eve, and left in the home. Written educational material also provided. MOST details:  Comfort care; IV antbx / IVFs if indicated. No tube feedings  I spent 105 minutes providing this consultation, from 9:30am to 11:15am. More than 50% of that time was spent coordinating communication.   HISTORY OF PRESENT ILLNESS:  Joseph Mora is a 84 y.o. year old male with multiple medical problems including dementia, CAD, DM, and arthritis.  Palliative Care was asked to help with symptom management, and address goals of care.   SH/H: Married x 61 yrs in home that he built. PCG wife [name]. Son Elberta Fortis lives in the home (age 15; has some developmental delays). Four biological children and adopted 3 children. Took care of 108 foster children. Quit smoking  26 yrs ago. No ETOH. Retired from Biomedical engineer work.   CODE STATUS: DNR  PPS: Weak 40% HOSPICE ELIGIBILITY/DIAGNOSIS: TBD  PAST MEDICAL HISTORY:  Past Medical History:  Diagnosis Date  . Arthritis    RA  . CAD (coronary artery disease)   . Diabetes mellitus   . Hyperlipidemia   . Memory difficulty 06/22/2015  . Myocardial infarction ([place])   . Tremor 06/22/2015   Intentional, right hand    SOCIAL HX:  Social History   Tobacco Use  . Smoking status: Former Smoker    Last attempt to quit: 08/03/2010    Years since quitting: 7.6  . Smokeless tobacco: Current User  Types: Chew  Substance Use Topics  . Alcohol use: No    ALLERGIES:  Allergies  Allergen Reactions  . Morphine And Related Itching  . Penicillins Hives    Has patient had a PCN reaction causing immediate rash, facial/tongue/throat swelling, SOB or lightheadedness with hypotension:  No Has patient had a PCN reaction causing severe rash involving mucus membranes or skin necrosis: No Has patient had a PCN reaction that required hospitalization: No Has patient had a PCN reaction occurring within the last 10 years: No If all of the above answers are "NO", then may proceed with Cephalosporin use.      PERTINENT MEDICATIONS:  Outpatient Encounter Medications as of 04/08/2018  Medication Sig  . aspirin (ASPIRIN 81) 81 MG EC tablet Take 81 mg by mouth daily. Swallow whole.  Marland Kitchen atorvastatin (LIPITOR) 40 MG tablet TAKE 1 TABLET BY MOUTH DAILY  . buPROPion (WELLBUTRIN XL) 150 MG 24 hr tablet Take 1 tablet (150 mg total) by mouth daily.  Marland Kitchen donepezil (ARICEPT) 10 MG tablet TAKE 1 TABLET(10 MG) BY MOUTH AT BEDTIME  . ibuprofen (ADVIL,MOTRIN) 200 MG tablet Take 200 mg by mouth 2 (two) times daily as needed.  . insulin NPH-regular Human (NOVOLIN 70/30) (70-30) 100 UNIT/ML injection Inject 50 u units before breakfast, 10 units before lunch and 30 units before supper. (hold if CBG is <100)  . memantine (NAMENDA) 10 MG tablet Take 1 tablet (10 mg total) by mouth 2 (two) times daily.  . metoprolol tartrate (LOPRESSOR) 50 MG tablet TAKE 1 TABLET BY MOUTH TWICE DAILY (Patient taking differently: TAKE 1 TABLET(50 mg) BY MOUTH TWICE DAILY)  . mirabegron ER (MYRBETRIQ) 50 MG TB24 tablet Take 50 mg by mouth daily.  . nitroGLYCERIN (NITROSTAT) 0.4 MG SL tablet Place 1 tablet (0.4 mg total) under the tongue every 5 (five) minutes as needed for chest pain.  Marland Kitchen sertraline (ZOLOFT) 50 MG tablet Take 1 tablet (50 mg total) by mouth daily.  . tamsulosin (FLOMAX) 0.4 MG CAPS capsule TAKE 1 CAPSULE BY MOUTH EVERY DAY  . vitamin B-12 100 MCG tablet Take 1 tablet (100 mcg total) by mouth daily.  Marland Kitchen lisinopril (PRINIVIL,ZESTRIL) 5 MG tablet Take 1 tablet (5 mg total) by mouth daily. (Patient not taking: Reported on 03/10/2018)   No facility-administered encounter medications on file as of 04/08/2018.      PHYSICAL EXAM:   General: NAD Pleasantly conversant; A & O x 2; thought it was 91, June. A& Cardiovascular: regular rate and rhythm; distant heart sounds Pulmonary:Bilat CTA Abdomen: soft, nontender, protruding, NABS Extremities: bilater LE edema to lower calf; non-pitting, no joint deformities Skin: no rashes; no breakdown Neurological:  nonfocal  Julianne Handler, NP

## 2018-04-08 NOTE — Patient Outreach (Signed)
[place] [facility]) Care Management  04/08/2018  Joseph Mora 1933-05-07 [id number]   Assessment: 84 year old with history of dementia, CAP, diabetes, chronic back pain. Recent admission to hospital per wife, client was "totally out of it, did not know where he was and kept falling" so he went to the hospital. She reports client was treated for dehydration. From hospital was discharged to [place] for rehabilitation and discharged from [place] on 6/23 to home.   Client is currently in transition of care program.RNCM called and spoke with client wife. She reports that client continues "to go down" as far as his memory, she reports he gets confused and wants her to take him home. She reports there are times when he does not know how to go sit back down in the chair and she guides him if he will let her.  [name] states she is alright in caring for client. RNCM called reinforced resources are available if she feels she is not able to continue to care for client. [name] verbalized understanding. Palliative care of [facility] completed home visit today.  [name] thanked [facility] for calling to follow up.  Plan: follow up telephonically next week. [facility] CM Care Plan Problem One     Most Recent Value  Care Plan Problem One  at risk for readmission as evidence by recent admission  (Pended)   Role Documenting the Problem One  Care Management Coordinator  (Pended)   [place] for Problem One  Active  (Pended)   [place] Term Goal   client will not be readmitted within the next 31 days.  (Pended)   [facility] Long Term Goal Start Date  03/22/18  (Pended)   [facility] CM Short Term Goal #1   client will verbalize participation with ome health agency within the next 1-2 weeks.  (Pended)   [facility] CM Short Term Goal #1 Start Date  03/22/18  (Pended)   [facility] CM Short Term Goal #1 Met Date  03/30/18  (Pended)   [facility] CM Short Term Goal #2   family will call RNCM or 24 hour nurse advice line as needed within the  next 30 days.  (Pended)   [facility] CM Short Term Goal #2 Start Date  03/22/18  (Pended)     [facility] CM Care Plan Problem Two     Most Recent Value  Care Plan Problem Two  at risk to fall as evidence by history of falls.  (Pended)   Role Documenting the Problem Two  Care Management Coordinator  (Pended)   Care Plan for Problem Two  Active  (Pended)   [facility] CM Short Term Goal #1   client/family will verbalize at least three fall prevention strategies in the next30 days.  (Pended)   [facility] CM Short Term Goal #1 Start Date  03/30/18  (Pended)      Thea Silversmith, RN, MSN, [place] Coordinator Cell: [phone]

## 2018-04-12 ENCOUNTER — Encounter: Payer: Self-pay | Admitting: Internal Medicine

## 2018-04-12 DIAGNOSIS — E119 Type 2 diabetes mellitus without complications: Secondary | ICD-10-CM | POA: Diagnosis not present

## 2018-04-12 DIAGNOSIS — R269 Unspecified abnormalities of gait and mobility: Secondary | ICD-10-CM | POA: Diagnosis not present

## 2018-04-12 DIAGNOSIS — M6281 Muscle weakness (generalized): Secondary | ICD-10-CM | POA: Diagnosis not present

## 2018-04-12 DIAGNOSIS — I2510 Atherosclerotic heart disease of native coronary artery without angina pectoris: Secondary | ICD-10-CM | POA: Diagnosis not present

## 2018-04-12 DIAGNOSIS — R079 Chest pain, unspecified: Secondary | ICD-10-CM | POA: Diagnosis not present

## 2018-04-12 DIAGNOSIS — M545 Low back pain: Secondary | ICD-10-CM | POA: Diagnosis not present

## 2018-04-12 DIAGNOSIS — Z794 Long term (current) use of insulin: Secondary | ICD-10-CM | POA: Diagnosis not present

## 2018-04-12 DIAGNOSIS — E785 Hyperlipidemia, unspecified: Secondary | ICD-10-CM | POA: Diagnosis not present

## 2018-04-12 DIAGNOSIS — Z7982 Long term (current) use of aspirin: Secondary | ICD-10-CM | POA: Diagnosis not present

## 2018-04-15 ENCOUNTER — Other Ambulatory Visit: Payer: Self-pay

## 2018-04-15 NOTE — Patient Outreach (Signed)
[facility]) Care Management  04/15/2018  Joseph Mora 25-Mar-1933 [id number]   84 year old with history of dementia, CAP, diabetes, chronic back pain. Recent admission to hospital per wife, client was"totally out of it, did not know where he was and kept falling" so hewent to the hospital. She reports client was treated for dehydration.From hospital was discharged to [place] for rehabilitationand discharged from [place] on 6/23 to home.  RNCM called to follow up. RNCM spoke with client's wife who reports no changes with client. She denies needing any educational material regarding dementia and the progression of dementia. She is agreeable to [facility] following up telephonically next week.  Plan: follow up telephonically next week.  Thea Silversmith, RN, MSN, [place] Coordinator Cell: [phone]

## 2018-04-20 ENCOUNTER — Other Ambulatory Visit: Payer: Self-pay

## 2018-04-20 NOTE — Patient Outreach (Signed)
[place] [facility]) Care Management  04/20/2018  Joseph Mora Mar 14, 1933 [id number]   RNCM called to follow up. [name] states that "he just sits around and sleeps 24/7". She states "he just sits around wanting me to take him home". She states, "He can't hardly get around or anything anymore". She reports home health has discharged him from their services.  RNCM discussed higher level of care options. [name] states she would like the Education officer, museum call her just to go over what the process would be to get him into a skilled facility from home. When she feels that she is not able to manage.   Plan: refer to [facility] social work. Follow up next week.  Thea Silversmith, RN, MSN, [place] Coordinator Cell: [phone]

## 2018-04-21 ENCOUNTER — Other Ambulatory Visit: Payer: Self-pay | Admitting: Licensed Clinical Social Worker

## 2018-04-21 NOTE — Patient Outreach (Signed)
[place] [facility]) Care Management  04/21/2018  Joseph Mora 1933/05/30 [id number]  [facility] CSW completed initial outreach attempt after receiving referral from [place]. Patient's spouse wanted to discuss and be educated on the LTC placement process for patient in the future. [facility] CSW completed outreach call to patient and was able to reach him successfully. HIPPA verifications received. [facility] CSW introduced self, reason for call and of [facility] social work services. Patient reports that he is unsure why spouse desired social work assistance but that there was a disagreement and she is not going to be at their residence today. Patient agreeable to [facility] CSW attemping additional outreach to spouse within one week.  Eula Fried, BSW, MSW, [email or web address] Phone: [phone] Fax: [phone]

## 2018-04-22 ENCOUNTER — Other Ambulatory Visit: Payer: Self-pay | Admitting: Licensed Clinical Social Worker

## 2018-04-22 NOTE — Patient Outreach (Signed)
[facility]) Care Management  04/22/2018  YONY ROULSTON 25-Oct-1932 [id number]  [facility] CSW received referral from [place]. Patient's spouse is wanting to talk to [facility] CSW about future LTC placement for spouse. [facility] CSW completed second outreach call and was able to reach spouse successfully. HIPPA verifications received. [facility] CSW introduced self, reason for call and of [facility] social work services. Spouse reports not wanting to pursue long term placement for patient yet but wanting to be educated on the process. [facility] CSW spent extensive amount of time educating family on the LTC placement process (the difference between facilities-Skilled Nursing or ALF placement), the Medicaid application process in order to be a payor source for facility cost, required documents for the Medicaid application completion like a FL2 signed by PCP as well as facility's requirements for placement (TB test, PT eval, etc.) Spouse reports that they own their home and are curious to see if this asset would stop patient from qualifying for either Special Assistance Medicaid or [place] Medicaid. Spouse was advised to go to DSS and meet with a Medicaid caseworker in order to go over her options. Spouse is agreeable to do this. Spouse denies any further questions or concerns and was greatly appreciative of information provided today and is agreeable to contact this [facility] CSW in the future if any social work needs arise. [facility] CSW will not open case at this time as community resource education has been provided. [facility] CSW will sign off.  Eula Fried, BSW, MSW, [email or web address] Phone: [phone] Fax: [phone]

## 2018-04-28 ENCOUNTER — Other Ambulatory Visit: Payer: Self-pay | Admitting: Internal Medicine

## 2018-04-28 DIAGNOSIS — E119 Type 2 diabetes mellitus without complications: Principal | ICD-10-CM

## 2018-04-28 DIAGNOSIS — IMO0001 Reserved for inherently not codable concepts without codable children: Secondary | ICD-10-CM

## 2018-04-28 DIAGNOSIS — Z794 Long term (current) use of insulin: Principal | ICD-10-CM

## 2018-04-30 ENCOUNTER — Other Ambulatory Visit: Payer: Self-pay

## 2018-04-30 NOTE — Patient Outreach (Signed)
[facility]) Care Management  04/30/2018  Joseph Mora Jun 28, 1933 [id number]   RNCM called to follow up. Spoke with Joseph Mora who reports that she has the information from [facility] work. She reports she called to schedule a time to apply for Medicaid for client. She states she is going to have family member stay with client while she goes down to the office to apply. Joseph Mora with no other questions or concerns at this time.   RNCM offered to schedule a home visit. Joseph Mora states her preference is for [facility] to call next month to follow up.  RNCM encouraged Joseph Mora to call [facility] and/or [facility] social work as needed.  Plan: follow up next month for any additional care management needs.  Thea Silversmith, RN, MSN, [place] Coordinator Cell: [phone]

## 2018-05-03 ENCOUNTER — Other Ambulatory Visit: Payer: PPO | Admitting: Internal Medicine

## 2018-05-03 ENCOUNTER — Telehealth: Payer: Self-pay

## 2018-05-03 DIAGNOSIS — E119 Type 2 diabetes mellitus without complications: Principal | ICD-10-CM

## 2018-05-03 DIAGNOSIS — IMO0001 Reserved for inherently not codable concepts without codable children: Secondary | ICD-10-CM

## 2018-05-03 DIAGNOSIS — Z794 Long term (current) use of insulin: Principal | ICD-10-CM

## 2018-05-03 NOTE — Telephone Encounter (Signed)
Phone call placed to patient to verify NP visit for today. Patient unaware of this and requested that his wife be phoned.

## 2018-05-03 NOTE — Telephone Encounter (Signed)
Spoke with patient's wife to reschedule visit with Palliative Care. Rescheduled for Wednesday 05/05/18

## 2018-05-04 LAB — HEMOGLOBIN A1C
HEMOGLOBIN A1C: 8.8 %{Hb} — AB (ref <5.7)
Mean Plasma Glucose: 206 (calc)
eAG (mmol/L): 11.4 (calc)

## 2018-05-05 ENCOUNTER — Other Ambulatory Visit: Payer: PPO | Admitting: Nurse Practitioner

## 2018-05-05 ENCOUNTER — Encounter: Payer: Self-pay | Admitting: Nurse Practitioner

## 2018-05-05 DIAGNOSIS — F0391 Unspecified dementia with behavioral disturbance: Secondary | ICD-10-CM

## 2018-05-05 DIAGNOSIS — Z515 Encounter for palliative care: Secondary | ICD-10-CM

## 2018-05-05 DIAGNOSIS — R296 Repeated falls: Secondary | ICD-10-CM

## 2018-05-05 DIAGNOSIS — R269 Unspecified abnormalities of gait and mobility: Secondary | ICD-10-CM

## 2018-05-05 NOTE — Progress Notes (Signed)
PALLIATIVE CARE CONSULT VISIT   PATIENT NAME: Joseph Mora DOB: 05/03/1933 MRN: [id number]  PRIMARY CARE PROVIDER:   Elby Showers, MD  REFERRING PROVIDER:  Elby Showers, MD [address] [place], [place] [address]  RESPONSIBLE PARTY:   Joseph Mora (spouse) [phone] (home and mobile)  ASSESSMENT/RECOMMENDATIONS :  Dementia depression -aricept, namenda, and zoloft -FAST 6D; patient requires cueing for ADL's; oftent refusing to bath; does become verbally and physically aggressive -wife to apply for medicaid tomorrow and then seek placement -will request SW consult  Gait disturbance with frequent falls -frequently forgets to use walker; no taumatic injuries  ACP -DNR/ (comfort care):MOST; IV antbx, and IVF's no feeding tube       I spent 30 minutes providing this consultation,  from 10:00 to 10:30 More than 50% of the time in this consultation was spent coordinating communication.   HISTORY OF PRESENT ILLNESS:  Joseph Mora is a 84 y.o. year old male with multiple medical problems including dementia with behavioral problems, gait disturbance with frequent falls . Palliative Care was asked to help with symptom management, and ongoing patient and family support .   CODE STATUS: DNR  PPS: 40% HOSPICE ELIGIBILITY/DIAGNOSIS: TBD  PAST MEDICAL HISTORY:  Past Medical History:  Diagnosis Date  . Arthritis    RA  . CAD (coronary artery disease)   . Diabetes mellitus   . Hyperlipidemia   . Memory difficulty 06/22/2015  . Myocardial infarction ([place])   . Tremor 06/22/2015   Intentional, right hand    SOCIAL HX:  Social History   Tobacco Use  . Smoking status: Former Smoker    Last attempt to quit: 08/03/2010    Years since quitting: 7.7  . Smokeless tobacco: Current User    Types: Chew  Substance Use Topics  . Alcohol use: No    ALLERGIES:  Allergies  Allergen Reactions  . Morphine And Related Itching  . Penicillins Hives    Has patient had a PCN  reaction causing immediate rash, facial/tongue/throat swelling, SOB or lightheadedness with hypotension: No Has patient had a PCN reaction causing severe rash involving mucus membranes or skin necrosis: No Has patient had a PCN reaction that required hospitalization: No Has patient had a PCN reaction occurring within the last 10 years: No If all of the above answers are "NO", then may proceed with Cephalosporin use.      PERTINENT MEDICATIONS:  Outpatient Encounter Medications as of 05/05/2018  Medication Sig  . aspirin (ASPIRIN 81) 81 MG EC tablet Take 81 mg by mouth daily. Swallow whole.  Marland Kitchen atorvastatin (LIPITOR) 40 MG tablet TAKE 1 TABLET BY MOUTH DAILY  . buPROPion (WELLBUTRIN XL) 150 MG 24 hr tablet Take 1 tablet (150 mg total) by mouth daily.  Marland Kitchen donepezil (ARICEPT) 10 MG tablet TAKE 1 TABLET(10 MG) BY MOUTH AT BEDTIME  . ibuprofen (ADVIL,MOTRIN) 200 MG tablet Take 200 mg by mouth 2 (two) times daily as needed.  . insulin NPH-regular Human (NOVOLIN 70/30) (70-30) 100 UNIT/ML injection Inject 50 u units before breakfast, 10 units before lunch and 30 units before supper. (hold if CBG is <100)  . lisinopril (PRINIVIL,ZESTRIL) 5 MG tablet Take 1 tablet (5 mg total) by mouth daily. (Patient not taking: Reported on 03/10/2018)  . memantine (NAMENDA) 10 MG tablet Take 1 tablet (10 mg total) by mouth 2 (two) times daily.  . metoprolol tartrate (LOPRESSOR) 50 MG tablet TAKE 1 TABLET BY MOUTH TWICE DAILY (Patient taking differently: TAKE  1 TABLET(50 mg) BY MOUTH TWICE DAILY)  . mirabegron ER (MYRBETRIQ) 50 MG TB24 tablet Take 50 mg by mouth daily.  . nitroGLYCERIN (NITROSTAT) 0.4 MG SL tablet Place 1 tablet (0.4 mg total) under the tongue every 5 (five) minutes as needed for chest pain.  Marland Kitchen sertraline (ZOLOFT) 50 MG tablet Take 1 tablet (50 mg total) by mouth daily.  . tamsulosin (FLOMAX) 0.4 MG CAPS capsule TAKE 1 CAPSULE BY MOUTH EVERY DAY  . vitamin B-12 100 MCG tablet Take 1 tablet (100 mcg total)  by mouth daily.   No facility-administered encounter medications on file as of 05/05/2018.     PHYSICAL EXAM:   General: NAD, WD/WN Cardiovascular: regular rate and rhythm Pulmonary: clear ant fields Abdomen: soft, nontender, + bowel sounds GU: no suprapubic tenderness Extremities: no edema, no joint deformities Skin: no rashes Neurological: nonfocal  Joseph Smiddy G Martinique, NP

## 2018-05-13 DIAGNOSIS — E785 Hyperlipidemia, unspecified: Secondary | ICD-10-CM | POA: Diagnosis not present

## 2018-05-13 DIAGNOSIS — E119 Type 2 diabetes mellitus without complications: Secondary | ICD-10-CM | POA: Diagnosis not present

## 2018-05-13 DIAGNOSIS — R269 Unspecified abnormalities of gait and mobility: Secondary | ICD-10-CM | POA: Diagnosis not present

## 2018-05-13 DIAGNOSIS — M6281 Muscle weakness (generalized): Secondary | ICD-10-CM | POA: Diagnosis not present

## 2018-05-13 DIAGNOSIS — Z794 Long term (current) use of insulin: Secondary | ICD-10-CM | POA: Diagnosis not present

## 2018-05-13 DIAGNOSIS — I2510 Atherosclerotic heart disease of native coronary artery without angina pectoris: Secondary | ICD-10-CM | POA: Diagnosis not present

## 2018-05-13 DIAGNOSIS — M545 Low back pain: Secondary | ICD-10-CM | POA: Diagnosis not present

## 2018-05-13 DIAGNOSIS — Z7982 Long term (current) use of aspirin: Secondary | ICD-10-CM | POA: Diagnosis not present

## 2018-05-13 DIAGNOSIS — R079 Chest pain, unspecified: Secondary | ICD-10-CM | POA: Diagnosis not present

## 2018-05-19 ENCOUNTER — Encounter: Payer: Self-pay | Admitting: Adult Health

## 2018-05-19 ENCOUNTER — Ambulatory Visit (INDEPENDENT_AMBULATORY_CARE_PROVIDER_SITE_OTHER): Payer: PPO | Admitting: Adult Health

## 2018-05-19 VITALS — BP 116/63 | HR 82 | Ht 64.5 in | Wt 191.8 lb

## 2018-05-19 DIAGNOSIS — R413 Other amnesia: Secondary | ICD-10-CM | POA: Diagnosis not present

## 2018-05-19 DIAGNOSIS — R269 Unspecified abnormalities of gait and mobility: Secondary | ICD-10-CM | POA: Diagnosis not present

## 2018-05-19 NOTE — Progress Notes (Signed)
I have read the note, and I agree with the clinical assessment and plan.  Charles K Willis   

## 2018-05-19 NOTE — Progress Notes (Signed)
PATIENT: Joseph Mora DOB: 09-20-1933  REASON FOR VISIT: follow up HISTORY FROM: patient  HISTORY OF PRESENT ILLNESS: Today 05/19/18:  Joseph Mora is an 84 year old male with a history of memory disturbance.  He returns today for follow-up.  He is here today with his wife and son.  They report that he requires all ADLs.  He does not operate a motor vehicle.  He does have good appetite.  His wife manages all the finances.  He denies any trouble sleeping.  His wife states that he sleeps during the day a lot..  She states that there are times he does get agitated.  She reports that physical therapy has worked with him due to his gait.  However she states that he continues to have falls.  They also gave him a Rollator but he has trouble using it.  Fortunately the patient has not suffered any injuries from his falls.  He returns today for evaluation.  HISTORY 11/19/17 Joseph Mora is an 84 year old male with a history of memory disturbance. He returns today for follow-up.  Patient is currently on Aricept and Namenda.  He continues to tolerate the medication well.  He does require assistance with ADLs.  His wife states that because he does not want to do it.  She reports that he has a hard time using the telephone and TV remote.  Reports good appetite.  Denies any trouble sleeping.  Denies hallucinations.  He does not operate a motor vehicle.  He returns today for evaluation.  REVIEW OF SYSTEMS: Out of a complete 14 system review of symptoms, the patient complains only of the following symptoms, and all other reviewed systems are negative.  See HPI  ALLERGIES: Allergies  Allergen Reactions  . Morphine And Related Itching  . Penicillins Hives    Has patient had a PCN reaction causing immediate rash, facial/tongue/throat swelling, SOB or lightheadedness with hypotension: No Has patient had a PCN reaction causing severe rash involving mucus membranes or skin necrosis: No Has patient had a PCN  reaction that required hospitalization: No Has patient had a PCN reaction occurring within the last 10 years: No If all of the above answers are "NO", then may proceed with Cephalosporin use.     HOME MEDICATIONS: Outpatient Medications Prior to Visit  Medication Sig Dispense Refill  . aspirin (ASPIRIN 81) 81 MG EC tablet Take 81 mg by mouth daily. Swallow whole.    Marland Kitchen atorvastatin (LIPITOR) 40 MG tablet TAKE 1 TABLET BY MOUTH DAILY 90 tablet 3  . buPROPion (WELLBUTRIN XL) 150 MG 24 hr tablet Take 1 tablet (150 mg total) by mouth daily. 30 tablet 2  . donepezil (ARICEPT) 10 MG tablet TAKE 1 TABLET(10 MG) BY MOUTH AT BEDTIME 90 tablet 3  . ibuprofen (ADVIL,MOTRIN) 200 MG tablet Take 200 mg by mouth 2 (two) times daily as needed.    . insulin NPH-regular Human (NOVOLIN 70/30) (70-30) 100 UNIT/ML injection Inject 50 u units before breakfast, 10 units before lunch and 30 units before supper. (hold if CBG is <100)    . memantine (NAMENDA) 10 MG tablet Take 1 tablet (10 mg total) by mouth 2 (two) times daily. 60 tablet 11  . metoprolol tartrate (LOPRESSOR) 50 MG tablet TAKE 1 TABLET BY MOUTH TWICE DAILY (Patient taking differently: TAKE 1 TABLET(50 mg) BY MOUTH TWICE DAILY) 180 tablet 3  . mirabegron ER (MYRBETRIQ) 50 MG TB24 tablet Take 50 mg by mouth daily.    . nitroGLYCERIN (NITROSTAT)  0.4 MG SL tablet Place 1 tablet (0.4 mg total) under the tongue every 5 (five) minutes as needed for chest pain. 25 tablet 6  . sertraline (ZOLOFT) 50 MG tablet Take 1 tablet (50 mg total) by mouth daily. 30 tablet 3  . tamsulosin (FLOMAX) 0.4 MG CAPS capsule TAKE 1 CAPSULE BY MOUTH EVERY DAY 90 capsule 0  . vitamin B-12 100 MCG tablet Take 1 tablet (100 mcg total) by mouth daily. 30 tablet 0  . lisinopril (PRINIVIL,ZESTRIL) 5 MG tablet Take 1 tablet (5 mg total) by mouth daily. (Patient not taking: Reported on 03/10/2018) 90 tablet 3   No facility-administered medications prior to visit.     PAST MEDICAL  HISTORY: Past Medical History:  Diagnosis Date  . Arthritis    RA  . CAD (coronary artery disease)   . Diabetes mellitus   . Hyperlipidemia   . Memory difficulty 06/22/2015  . Myocardial infarction ([place])   . Tremor 06/22/2015   Intentional, right hand    PAST SURGICAL HISTORY: Past Surgical History:  Procedure Laterality Date  . BACK SURGERY    . CARPAL TUNNEL RELEASE Bilateral   . CATARACT EXTRACTION Bilateral   . INGUINAL HERNIA REPAIR    . PARATHYROIDECTOMY      FAMILY HISTORY: Family History  Problem Relation Age of Onset  . Heart attack Paternal Aunt   . Stroke Mother   . Diabetes Mother   . Dementia Mother   . Diabetes Sister   . Stroke Sister   . Dementia Sister   . Diabetes Brother   . Dementia Brother   . Hypertension Neg Hx     SOCIAL HISTORY: Social History   Socioeconomic History  . Marital status: Married    Spouse name: [name]  . Number of children: 4  . Years of education: GED  . Highest education level: Not on file  Occupational History  . Occupation: retired  Scientific laboratory technician  . Financial resource strain: Not on file  . Food insecurity:    Worry: Not on file    Inability: Not on file  . Transportation needs:    Medical: Not on file    Non-medical: Not on file  Tobacco Use  . Smoking status: Former Smoker    Last attempt to quit: 08/03/2010    Years since quitting: 7.7  . Smokeless tobacco: Current User    Types: Chew  Substance and Sexual Activity  . Alcohol use: No  . Drug use: No  . Sexual activity: Not on file  Lifestyle  . Physical activity:    Days per week: Not on file    Minutes per session: Not on file  . Stress: Not on file  Relationships  . Social connections:    Talks on phone: Not on file    Gets together: Not on file    Attends religious service: Not on file    Active member of club or organization: Not on file    Attends meetings of clubs or organizations: Not on file    Relationship status: Not on file  . Intimate  partner violence:    Fear of current or ex partner: Not on file    Emotionally abused: Not on file    Physically abused: Not on file    Forced sexual activity: Not on file  Other Topics Concern  . Not on file  Social History Narrative   Patient drinks 3 cups of caffeine daily.   Patient is right handed.  PHYSICAL EXAM  Vitals:   05/19/18 0742  BP: 116/63  Pulse: 82  Weight: 191 lb 12.8 oz (87 kg)  Height: 5' 4.5" (1.638 m)   Body mass index is 32.41 kg/m.   MMSE - Mini Mental State Exam 05/19/2018 11/19/2017 05/20/2017  Orientation to time 1 0 2  Orientation to Place 4 3 4   Registration 3 3 3   Attention/ Calculation 0 0 0  Recall 3 2 1   Recall-comments - - -  Language- name 2 objects 2 2 2   Language- repeat 1 0 1  Language- follow 3 step command 3 3 3   Language- read & follow direction 1 1 1   Write a sentence 0 0 1  Write a sentence-comments - - -  Copy design 0 0 0  Total score 18 14 18      Generalized: Well developed, in no acute distress   Neurological examination  Mentation: Alert. Follows all commands speech and language fluent Cranial nerve II-XII:  Extraocular movements were full, visual field were full on confrontational test. Facial sensation and strength were normal. Uvula tongue midline. Head turning and shoulder shrug  were normal and symmetric. Motor: The motor testing reveals 5 over 5 strength of all 4 extremities. Good symmetric motor tone is noted throughout.  Sensory: Sensory testing is intact to soft touch on all 4 extremities. No evidence of extinction is noted.  Coordination: Cerebellar testing reveals good finger-nose-finger and heel-to-shin bilaterally.  Gait and station: Patient's gait is unsteady.  Gait is wide-based.  He uses a cane when ambulating.  Tandem gait not attempted. Reflexes: Deep tendon reflexes are symmetric and normal bilaterally.   DIAGNOSTIC DATA (LABS, IMAGING, TESTING) - I reviewed patient records, labs, notes,  testing and imaging myself where available.  Lab Results  Component Value Date   WBC 7.8 03/10/2018   HGB 15.0 03/10/2018   HCT 45.8 03/10/2018   MCV 96.6 03/10/2018   PLT 110 (L) 03/10/2018      Component Value Date/Time   NA 136 03/10/2018 1730   K 4.5 03/10/2018 1730   CL 102 03/10/2018 1730   CO2 26 03/10/2018 1730   GLUCOSE 225 (H) 03/10/2018 1730   BUN 20 03/10/2018 1730   CREATININE 1.37 (H) 03/10/2018 1730   CREATININE 1.15 (H) 07/30/2017 1053   CALCIUM 8.5 (L) 03/10/2018 1730   PROT 5.6 (L) 02/25/2018 0704   ALBUMIN 2.7 (L) 02/25/2018 0704   AST 26 02/25/2018 0704   ALT 20 02/25/2018 0704   ALKPHOS 82 02/25/2018 0704   BILITOT 1.1 02/25/2018 0704   GFRNONAA 46 (L) 03/10/2018 1730   GFRNONAA 59 (L) 07/30/2017 1053   GFRAA 53 (L) 03/10/2018 1730   GFRAA 68 07/30/2017 1053   Lab Results  Component Value Date   CHOL 137 01/28/2018   HDL 44 01/28/2018   LDLCALC 76 01/28/2018   TRIG 87 01/28/2018   CHOLHDL 3.1 01/28/2018   Lab Results  Component Value Date   HGBA1C 8.8 (H) 05/03/2018   Lab Results  Component Value Date   VITAMINB12 1,216 (H) 03/22/2018   Lab Results  Component Value Date   TSH 2.02 03/22/2018      ASSESSMENT AND PLAN 84 y.o. year old male  has a past medical history of Arthritis, CAD (coronary artery disease), Diabetes mellitus, Hyperlipidemia, Memory difficulty (06/22/2015), Myocardial infarction ([place]), and Tremor (06/22/2015). here with:  1.  Memory disturbance 2.  Abnormality of gait and balance  The patient's memory score has remained stable.  He will continue on Aricept and Namenda.  We discussed another round of physical therapy however the patient and his wife deferred.  I have advised that if his symptoms worsen or he develops new symptoms they should let us know.  He will follow-up in 6 months or sooner if needed.     Ward Givens, MSN, NP-C 05/19/2018, 8:13 AM [facility] [address] [place], [place] [address] [phone]

## 2018-05-19 NOTE — Patient Instructions (Signed)
Your Plan:  Continue Aricept and Namenda Memory score is stable If your symptoms worsen or you develop new symptoms please let us know.    Thank you for coming to see us at [facility]. I hope we have been able to provide you high quality care today.  You may receive a patient satisfaction survey over the next few weeks. We would appreciate your feedback and comments so that we may continue to improve ourselves and the health of our patients.  

## 2018-05-21 ENCOUNTER — Ambulatory Visit: Payer: PPO | Admitting: Podiatry

## 2018-05-25 ENCOUNTER — Other Ambulatory Visit: Payer: Self-pay | Admitting: Internal Medicine

## 2018-05-27 ENCOUNTER — Other Ambulatory Visit: Payer: Self-pay

## 2018-05-27 DIAGNOSIS — E119 Type 2 diabetes mellitus without complications: Secondary | ICD-10-CM

## 2018-05-27 DIAGNOSIS — R413 Other amnesia: Secondary | ICD-10-CM

## 2018-05-27 NOTE — Patient Outreach (Signed)
[facility]) Care Management  05/27/2018  Joseph Mora 1933/06/16 [id number]    84 year old with history of dementia, CAP, diabetes, chronic back pain. RNCM called to assess if any additional care management needs. RNCM spoke with client's wife who states no change in clients condition.   [name] states that she is still in the process of getting client transitioned to a skilled facility. She denies any questions or concerns at this time.   Client continues to be active with palliative care and is seen monthly. Per chart, the next scheduled visit is around 06/05/18.  No additional care management needs noted. RNCM discussed case closure with [name]. [name] is agreeable. RNCM provided [facility] [name]'s contact number and encouraged to call with any questions or concerns regarding the transition to skilled facility. RNCM also reinforced that palliative care was also a resource as well as the 24 hour nurse advice line. In addition, RNCM confirmed that [name] has RNCM's contact number and encouraged to call as needed.  Plan: close case.  Thea Silversmith, RN, MSN, [place] Coordinator Cell: [phone]

## 2018-05-31 DIAGNOSIS — F0391 Unspecified dementia with behavioral disturbance: Secondary | ICD-10-CM | POA: Diagnosis not present

## 2018-05-31 DIAGNOSIS — I10 Essential (primary) hypertension: Secondary | ICD-10-CM | POA: Diagnosis not present

## 2018-05-31 DIAGNOSIS — R251 Tremor, unspecified: Secondary | ICD-10-CM | POA: Diagnosis not present

## 2018-05-31 DIAGNOSIS — Z87891 Personal history of nicotine dependence: Secondary | ICD-10-CM | POA: Diagnosis not present

## 2018-05-31 DIAGNOSIS — Z8701 Personal history of pneumonia (recurrent): Secondary | ICD-10-CM | POA: Diagnosis not present

## 2018-05-31 DIAGNOSIS — I252 Old myocardial infarction: Secondary | ICD-10-CM | POA: Diagnosis not present

## 2018-05-31 DIAGNOSIS — N400 Enlarged prostate without lower urinary tract symptoms: Secondary | ICD-10-CM | POA: Diagnosis not present

## 2018-05-31 DIAGNOSIS — E119 Type 2 diabetes mellitus without complications: Secondary | ICD-10-CM | POA: Diagnosis not present

## 2018-05-31 DIAGNOSIS — E785 Hyperlipidemia, unspecified: Secondary | ICD-10-CM | POA: Diagnosis not present

## 2018-05-31 DIAGNOSIS — Z9181 History of falling: Secondary | ICD-10-CM | POA: Diagnosis not present

## 2018-05-31 DIAGNOSIS — I2510 Atherosclerotic heart disease of native coronary artery without angina pectoris: Secondary | ICD-10-CM | POA: Diagnosis not present

## 2018-05-31 DIAGNOSIS — S61401D Unspecified open wound of right hand, subsequent encounter: Secondary | ICD-10-CM | POA: Diagnosis not present

## 2018-05-31 DIAGNOSIS — M1990 Unspecified osteoarthritis, unspecified site: Secondary | ICD-10-CM | POA: Diagnosis not present

## 2018-05-31 DIAGNOSIS — M6281 Muscle weakness (generalized): Secondary | ICD-10-CM | POA: Diagnosis not present

## 2018-06-01 ENCOUNTER — Other Ambulatory Visit: Payer: Self-pay | Admitting: Internal Medicine

## 2018-06-01 ENCOUNTER — Telehealth: Payer: Self-pay | Admitting: Emergency Medicine

## 2018-06-01 NOTE — Telephone Encounter (Signed)
He is going to a nursing facility so why does he need home health anymore?

## 2018-06-01 NOTE — Telephone Encounter (Signed)
[facility] called and is requesting verbal orders to see the patient twice a week for 4 weeks starting Sept 9th. Thanks.

## 2018-06-01 NOTE — Telephone Encounter (Signed)
Left detailed message.   

## 2018-06-03 ENCOUNTER — Telehealth: Payer: Self-pay

## 2018-06-03 NOTE — Telephone Encounter (Signed)
Spoke with patient's wife to offer to schedule a follow up visit with Palliative Care. Visit scheduled for Tuesday 06/08/18.

## 2018-06-07 NOTE — Progress Notes (Incomplete)
PALLIATIVE CARE CONSULT VISIT   PATIENT NAME: Joseph Mora DOB: April 15, 1933 MRN: [id number]  PRIMARY CARE PROVIDER:   Elby Showers, MD  REFERRING PROVIDER:  Elby Showers, MD [address] [place], [place] [address]  RESPONSIBLE PARTY:   Joseph Mora (spouse) [phone] home and mobile  ASSESSMENT/RECOMMENDATIONS and PLAN:  Dementia; MMSE 18/30 (05/19/18)  -depression -gait disturbance with/falls -Evaluated by [facility] -requires full ADL's; appetite good; weight 191 lbs. -continue aricept and namenda  Chronic medical problems -T2DM -HTN -HLD -CAD s/p MI -overactive bladder/BPH -RA -tremor -Manged by primary care provider -Novolin 50u before breakfast, 10u before lunch and 30u before supper -metoprolol/lisinopril -atorvastatin -ASA/PRN NTG -myrbetriq   ACP        I spent *** minutes providing this consultation,  from *** to ***. More than 50% of the time in this consultation was spent coordinating communication.   HISTORY OF PRESENT ILLNESS:  Joseph Mora is a 84 y.o. year old male with multiple medical problems including dementia, depression, frequent falls, T2DM,HTN,HLD, CAD s/p MI, RA, overactive bladder/BPH . Palliative Care was asked to help address goals of care.   CODE STATUS:   PPS: 0% HOSPICE ELIGIBILITY/DIAGNOSIS: TBD  PAST MEDICAL HISTORY:  Past Medical History:  Diagnosis Date  . Arthritis    RA  . CAD (coronary artery disease)   . Diabetes mellitus   . Hyperlipidemia   . Memory difficulty 06/22/2015  . Myocardial infarction ([place])   . Tremor 06/22/2015   Intentional, right hand    SOCIAL HX:  Social History   Tobacco Use  . Smoking status: Former Smoker    Last attempt to quit: 08/03/2010    Years since quitting: 7.8  . Smokeless tobacco: Current User    Types: Chew  Substance Use Topics  . Alcohol use: No    ALLERGIES:  Allergies  Allergen Reactions  . Morphine And Related Itching  . Penicillins  Hives    Has patient had a PCN reaction causing immediate rash, facial/tongue/throat swelling, SOB or lightheadedness with hypotension: No Has patient had a PCN reaction causing severe rash involving mucus membranes or skin necrosis: No Has patient had a PCN reaction that required hospitalization: No Has patient had a PCN reaction occurring within the last 10 years: No If all of the above answers are "NO", then may proceed with Cephalosporin use.      PERTINENT MEDICATIONS:  Outpatient Encounter Medications as of 06/08/2018  Medication Sig  . aspirin (ASPIRIN 81) 81 MG EC tablet Take 81 mg by mouth daily. Swallow whole.  Marland Kitchen atorvastatin (LIPITOR) 40 MG tablet TAKE 1 TABLET BY MOUTH DAILY  . buPROPion (WELLBUTRIN XL) 150 MG 24 hr tablet TAKE 1 TABLET(150 MG) BY MOUTH DAILY  . donepezil (ARICEPT) 10 MG tablet TAKE 1 TABLET(10 MG) BY MOUTH AT BEDTIME  . ibuprofen (ADVIL,MOTRIN) 200 MG tablet Take 200 mg by mouth 2 (two) times daily as needed.  . insulin NPH-regular Human (NOVOLIN 70/30) (70-30) 100 UNIT/ML injection Inject 50 u units before breakfast, 10 units before lunch and 30 units before supper. (hold if CBG is <100)  . memantine (NAMENDA) 10 MG tablet Take 1 tablet (10 mg total) by mouth 2 (two) times daily.  . metoprolol tartrate (LOPRESSOR) 50 MG tablet Take 1 tablet (50 mg total) by mouth 2 (two) times daily.  . mirabegron ER (MYRBETRIQ) 50 MG TB24 tablet Take 50 mg by mouth daily.  . nitroGLYCERIN (NITROSTAT) 0.4 MG SL tablet Place  1 tablet (0.4 mg total) under the tongue every 5 (five) minutes as needed for chest pain.  Marland Kitchen sertraline (ZOLOFT) 50 MG tablet TAKE 1 TABLET(50 MG) BY MOUTH DAILY  . tamsulosin (FLOMAX) 0.4 MG CAPS capsule TAKE 1 CAPSULE BY MOUTH EVERY DAY  . vitamin B-12 100 MCG tablet Take 1 tablet (100 mcg total) by mouth daily.   No facility-administered encounter medications on file as of 06/08/2018.     PHYSICAL EXAM:   General: NAD, frail appearing,  thin Cardiovascular: regular rate and rhythm Pulmonary: clear ant fields Abdomen: soft, nontender, + bowel sounds GU: no suprapubic tenderness Extremities: no edema, no joint deformities; unsteady gait , uses can Skin: no rashes Neurological: Weakness but otherwise nonfocal  Joseph Quincy G Martin[place], NP

## 2018-06-08 ENCOUNTER — Other Ambulatory Visit: Payer: PPO | Admitting: Nurse Practitioner

## 2018-06-12 DIAGNOSIS — N401 Enlarged prostate with lower urinary tract symptoms: Secondary | ICD-10-CM

## 2018-06-12 DIAGNOSIS — I10 Essential (primary) hypertension: Secondary | ICD-10-CM

## 2018-06-12 DIAGNOSIS — M545 Low back pain: Secondary | ICD-10-CM

## 2018-06-12 DIAGNOSIS — E119 Type 2 diabetes mellitus without complications: Secondary | ICD-10-CM

## 2018-06-12 DIAGNOSIS — R351 Nocturia: Secondary | ICD-10-CM

## 2018-06-12 DIAGNOSIS — G8929 Other chronic pain: Secondary | ICD-10-CM

## 2018-06-12 DIAGNOSIS — R413 Other amnesia: Secondary | ICD-10-CM

## 2018-06-13 DIAGNOSIS — Z7982 Long term (current) use of aspirin: Secondary | ICD-10-CM | POA: Diagnosis not present

## 2018-06-13 DIAGNOSIS — M545 Low back pain: Secondary | ICD-10-CM | POA: Diagnosis not present

## 2018-06-13 DIAGNOSIS — R079 Chest pain, unspecified: Secondary | ICD-10-CM | POA: Diagnosis not present

## 2018-06-13 DIAGNOSIS — R269 Unspecified abnormalities of gait and mobility: Secondary | ICD-10-CM | POA: Diagnosis not present

## 2018-06-13 DIAGNOSIS — Z794 Long term (current) use of insulin: Secondary | ICD-10-CM | POA: Diagnosis not present

## 2018-06-13 DIAGNOSIS — E785 Hyperlipidemia, unspecified: Secondary | ICD-10-CM | POA: Diagnosis not present

## 2018-06-13 DIAGNOSIS — E119 Type 2 diabetes mellitus without complications: Secondary | ICD-10-CM | POA: Diagnosis not present

## 2018-06-13 DIAGNOSIS — I2510 Atherosclerotic heart disease of native coronary artery without angina pectoris: Secondary | ICD-10-CM | POA: Diagnosis not present

## 2018-06-13 DIAGNOSIS — M6281 Muscle weakness (generalized): Secondary | ICD-10-CM | POA: Diagnosis not present

## 2018-06-14 ENCOUNTER — Encounter (INDEPENDENT_AMBULATORY_CARE_PROVIDER_SITE_OTHER): Payer: PPO | Admitting: Ophthalmology

## 2018-06-14 DIAGNOSIS — E113593 Type 2 diabetes mellitus with proliferative diabetic retinopathy without macular edema, bilateral: Secondary | ICD-10-CM

## 2018-06-14 DIAGNOSIS — H43813 Vitreous degeneration, bilateral: Secondary | ICD-10-CM

## 2018-06-14 DIAGNOSIS — H35033 Hypertensive retinopathy, bilateral: Secondary | ICD-10-CM

## 2018-06-14 DIAGNOSIS — E11319 Type 2 diabetes mellitus with unspecified diabetic retinopathy without macular edema: Secondary | ICD-10-CM | POA: Diagnosis not present

## 2018-06-14 DIAGNOSIS — I10 Essential (primary) hypertension: Secondary | ICD-10-CM

## 2018-06-14 DIAGNOSIS — H35372 Puckering of macula, left eye: Secondary | ICD-10-CM

## 2018-06-21 DIAGNOSIS — E1165 Type 2 diabetes mellitus with hyperglycemia: Secondary | ICD-10-CM | POA: Diagnosis not present

## 2018-06-21 DIAGNOSIS — Z23 Encounter for immunization: Secondary | ICD-10-CM | POA: Diagnosis not present

## 2018-06-21 DIAGNOSIS — I10 Essential (primary) hypertension: Secondary | ICD-10-CM | POA: Diagnosis not present

## 2018-06-21 DIAGNOSIS — E7800 Pure hypercholesterolemia, unspecified: Secondary | ICD-10-CM | POA: Diagnosis not present

## 2018-06-25 ENCOUNTER — Other Ambulatory Visit: Payer: Self-pay | Admitting: Internal Medicine

## 2018-06-25 NOTE — Telephone Encounter (Signed)
Do I still need to refill these if he is in nursing home? Please call [name] and ask.

## 2018-07-01 ENCOUNTER — Other Ambulatory Visit: Payer: Self-pay | Admitting: Internal Medicine

## 2018-07-01 MED ORDER — MEMANTINE HCL 10 MG PO TABS: 10.0000 mg | ORAL_TABLET | Freq: Two times a day (BID) | ORAL | 0 refills | Status: DC

## 2018-07-07 ENCOUNTER — Telehealth: Payer: Self-pay | Admitting: Internal Medicine

## 2018-07-07 NOTE — Telephone Encounter (Signed)
Discussion via phone regarding care of patient at home vs nursing home with Dr. Coralie Carpen , Medical Director for [facility].Apparently, there are issues with Medicare coverage if pt does not have rehab potential. Dr. Amalia Hailey will speak with [facility] and Palliative Care about options. I will call Mrs. Boys, patient's wife, to try to explain this.

## 2018-07-08 NOTE — Telephone Encounter (Signed)
Have spoken with Joseph Mora. New medicaid form completed.

## 2018-07-09 DIAGNOSIS — I2510 Atherosclerotic heart disease of native coronary artery without angina pectoris: Secondary | ICD-10-CM | POA: Diagnosis not present

## 2018-07-09 DIAGNOSIS — M6281 Muscle weakness (generalized): Secondary | ICD-10-CM | POA: Diagnosis not present

## 2018-07-09 DIAGNOSIS — G250 Essential tremor: Secondary | ICD-10-CM

## 2018-07-09 DIAGNOSIS — I519 Heart disease, unspecified: Secondary | ICD-10-CM

## 2018-07-09 DIAGNOSIS — R2681 Unsteadiness on feet: Secondary | ICD-10-CM | POA: Diagnosis not present

## 2018-07-09 DIAGNOSIS — Z794 Long term (current) use of insulin: Secondary | ICD-10-CM | POA: Diagnosis not present

## 2018-07-09 DIAGNOSIS — R41841 Cognitive communication deficit: Secondary | ICD-10-CM | POA: Diagnosis not present

## 2018-07-09 DIAGNOSIS — E538 Deficiency of other specified B group vitamins: Secondary | ICD-10-CM | POA: Diagnosis not present

## 2018-07-09 DIAGNOSIS — M545 Low back pain: Secondary | ICD-10-CM

## 2018-07-09 DIAGNOSIS — I10 Essential (primary) hypertension: Secondary | ICD-10-CM | POA: Diagnosis not present

## 2018-07-09 DIAGNOSIS — H903 Sensorineural hearing loss, bilateral: Secondary | ICD-10-CM

## 2018-07-09 DIAGNOSIS — R269 Unspecified abnormalities of gait and mobility: Secondary | ICD-10-CM

## 2018-07-09 DIAGNOSIS — R413 Other amnesia: Secondary | ICD-10-CM

## 2018-07-09 DIAGNOSIS — R2689 Other abnormalities of gait and mobility: Secondary | ICD-10-CM | POA: Diagnosis not present

## 2018-07-09 DIAGNOSIS — E119 Type 2 diabetes mellitus without complications: Secondary | ICD-10-CM | POA: Diagnosis not present

## 2018-07-09 DIAGNOSIS — N401 Enlarged prostate with lower urinary tract symptoms: Secondary | ICD-10-CM | POA: Diagnosis not present

## 2018-07-09 DIAGNOSIS — F339 Major depressive disorder, recurrent, unspecified: Secondary | ICD-10-CM | POA: Diagnosis not present

## 2018-07-13 ENCOUNTER — Ambulatory Visit: Payer: PPO | Admitting: Podiatry

## 2018-07-13 DIAGNOSIS — R079 Chest pain, unspecified: Secondary | ICD-10-CM | POA: Diagnosis not present

## 2018-07-13 DIAGNOSIS — M545 Low back pain: Secondary | ICD-10-CM | POA: Diagnosis not present

## 2018-07-13 DIAGNOSIS — I2510 Atherosclerotic heart disease of native coronary artery without angina pectoris: Secondary | ICD-10-CM | POA: Diagnosis not present

## 2018-07-13 DIAGNOSIS — Z794 Long term (current) use of insulin: Secondary | ICD-10-CM | POA: Diagnosis not present

## 2018-07-13 DIAGNOSIS — E119 Type 2 diabetes mellitus without complications: Secondary | ICD-10-CM | POA: Diagnosis not present

## 2018-07-13 DIAGNOSIS — E785 Hyperlipidemia, unspecified: Secondary | ICD-10-CM | POA: Diagnosis not present

## 2018-07-13 DIAGNOSIS — M6281 Muscle weakness (generalized): Secondary | ICD-10-CM | POA: Diagnosis not present

## 2018-07-13 DIAGNOSIS — Z7982 Long term (current) use of aspirin: Secondary | ICD-10-CM | POA: Diagnosis not present

## 2018-07-13 DIAGNOSIS — R269 Unspecified abnormalities of gait and mobility: Secondary | ICD-10-CM | POA: Diagnosis not present

## 2018-07-14 DIAGNOSIS — I25118 Atherosclerotic heart disease of native coronary artery with other forms of angina pectoris: Secondary | ICD-10-CM | POA: Diagnosis not present

## 2018-07-14 DIAGNOSIS — E785 Hyperlipidemia, unspecified: Secondary | ICD-10-CM | POA: Diagnosis not present

## 2018-07-14 DIAGNOSIS — F0150 Vascular dementia without behavioral disturbance: Secondary | ICD-10-CM | POA: Diagnosis not present

## 2018-07-14 DIAGNOSIS — E119 Type 2 diabetes mellitus without complications: Secondary | ICD-10-CM | POA: Diagnosis not present

## 2018-07-26 DIAGNOSIS — F338 Other recurrent depressive disorders: Secondary | ICD-10-CM | POA: Diagnosis not present

## 2018-07-26 DIAGNOSIS — F0150 Vascular dementia without behavioral disturbance: Secondary | ICD-10-CM | POA: Diagnosis not present

## 2018-07-26 DIAGNOSIS — Z794 Long term (current) use of insulin: Secondary | ICD-10-CM | POA: Diagnosis not present

## 2018-07-26 DIAGNOSIS — R7309 Other abnormal glucose: Secondary | ICD-10-CM | POA: Diagnosis not present

## 2018-08-02 DIAGNOSIS — F99 Mental disorder, not otherwise specified: Secondary | ICD-10-CM | POA: Diagnosis not present

## 2018-08-02 DIAGNOSIS — Z789 Other specified health status: Secondary | ICD-10-CM | POA: Diagnosis not present

## 2018-08-02 DIAGNOSIS — F0150 Vascular dementia without behavioral disturbance: Secondary | ICD-10-CM | POA: Diagnosis not present

## 2018-08-02 DIAGNOSIS — F5105 Insomnia due to other mental disorder: Secondary | ICD-10-CM | POA: Diagnosis not present

## 2018-08-03 ENCOUNTER — Other Ambulatory Visit: Payer: PPO | Admitting: Internal Medicine

## 2018-08-04 DIAGNOSIS — F0281 Dementia in other diseases classified elsewhere with behavioral disturbance: Secondary | ICD-10-CM | POA: Diagnosis not present

## 2018-08-04 DIAGNOSIS — R45851 Suicidal ideations: Secondary | ICD-10-CM | POA: Diagnosis not present

## 2018-08-04 DIAGNOSIS — R2681 Unsteadiness on feet: Secondary | ICD-10-CM | POA: Diagnosis not present

## 2018-08-04 DIAGNOSIS — R7309 Other abnormal glucose: Secondary | ICD-10-CM | POA: Diagnosis not present

## 2018-08-06 ENCOUNTER — Encounter: Payer: PPO | Admitting: Internal Medicine

## 2018-08-09 DIAGNOSIS — F0150 Vascular dementia without behavioral disturbance: Secondary | ICD-10-CM | POA: Diagnosis not present

## 2018-08-09 DIAGNOSIS — F0281 Dementia in other diseases classified elsewhere with behavioral disturbance: Secondary | ICD-10-CM | POA: Diagnosis not present

## 2018-08-10 DIAGNOSIS — W19XXXA Unspecified fall, initial encounter: Secondary | ICD-10-CM | POA: Diagnosis not present

## 2018-08-10 DIAGNOSIS — S0990XA Unspecified injury of head, initial encounter: Secondary | ICD-10-CM | POA: Diagnosis not present

## 2018-08-10 DIAGNOSIS — G8911 Acute pain due to trauma: Secondary | ICD-10-CM | POA: Diagnosis not present

## 2018-08-10 DIAGNOSIS — R58 Hemorrhage, not elsewhere classified: Secondary | ICD-10-CM | POA: Diagnosis not present

## 2018-08-11 ENCOUNTER — Encounter (HOSPITAL_COMMUNITY): Payer: Self-pay | Admitting: Emergency Medicine

## 2018-08-11 ENCOUNTER — Emergency Department (HOSPITAL_COMMUNITY): Payer: PPO

## 2018-08-11 ENCOUNTER — Other Ambulatory Visit: Payer: Self-pay

## 2018-08-11 ENCOUNTER — Emergency Department (HOSPITAL_COMMUNITY)
Admission: EM | Admit: 2018-08-11 | Discharge: 2018-08-11 | Disposition: A | Discharge status: Home / Self Care | Payer: PPO | Attending: Emergency Medicine | Admitting: Emergency Medicine

## 2018-08-11 DIAGNOSIS — R279 Unspecified lack of coordination: Secondary | ICD-10-CM | POA: Diagnosis not present

## 2018-08-11 DIAGNOSIS — I2510 Atherosclerotic heart disease of native coronary artery without angina pectoris: Secondary | ICD-10-CM | POA: Insufficient documentation

## 2018-08-11 DIAGNOSIS — S0990XA Unspecified injury of head, initial encounter: Secondary | ICD-10-CM | POA: Diagnosis not present

## 2018-08-11 DIAGNOSIS — Z794 Long term (current) use of insulin: Secondary | ICD-10-CM | POA: Diagnosis not present

## 2018-08-11 DIAGNOSIS — Y92128 Other place in nursing home as the place of occurrence of the external cause: Secondary | ICD-10-CM | POA: Diagnosis not present

## 2018-08-11 DIAGNOSIS — W010XXA Fall on same level from slipping, tripping and stumbling without subsequent striking against object, initial encounter: Secondary | ICD-10-CM | POA: Diagnosis not present

## 2018-08-11 DIAGNOSIS — W19XXXA Unspecified fall, initial encounter: Secondary | ICD-10-CM

## 2018-08-11 DIAGNOSIS — F0390 Unspecified dementia without behavioral disturbance: Secondary | ICD-10-CM | POA: Diagnosis not present

## 2018-08-11 DIAGNOSIS — Y999 Unspecified external cause status: Secondary | ICD-10-CM | POA: Diagnosis not present

## 2018-08-11 DIAGNOSIS — Z79899 Other long term (current) drug therapy: Secondary | ICD-10-CM | POA: Insufficient documentation

## 2018-08-11 DIAGNOSIS — E119 Type 2 diabetes mellitus without complications: Secondary | ICD-10-CM | POA: Insufficient documentation

## 2018-08-11 DIAGNOSIS — Y9389 Activity, other specified: Secondary | ICD-10-CM | POA: Insufficient documentation

## 2018-08-11 DIAGNOSIS — F17220 Nicotine dependence, chewing tobacco, uncomplicated: Secondary | ICD-10-CM | POA: Diagnosis not present

## 2018-08-11 DIAGNOSIS — Z7982 Long term (current) use of aspirin: Secondary | ICD-10-CM | POA: Diagnosis not present

## 2018-08-11 DIAGNOSIS — R2681 Unsteadiness on feet: Secondary | ICD-10-CM | POA: Diagnosis not present

## 2018-08-11 DIAGNOSIS — Z743 Need for continuous supervision: Secondary | ICD-10-CM | POA: Diagnosis not present

## 2018-08-11 DIAGNOSIS — F0150 Vascular dementia without behavioral disturbance: Secondary | ICD-10-CM | POA: Diagnosis not present

## 2018-08-11 DIAGNOSIS — I10 Essential (primary) hypertension: Secondary | ICD-10-CM | POA: Insufficient documentation

## 2018-08-11 DIAGNOSIS — S199XXA Unspecified injury of neck, initial encounter: Secondary | ICD-10-CM | POA: Diagnosis not present

## 2018-08-11 DIAGNOSIS — R51 Headache: Secondary | ICD-10-CM | POA: Diagnosis not present

## 2018-08-11 DIAGNOSIS — M542 Cervicalgia: Secondary | ICD-10-CM | POA: Diagnosis not present

## 2018-08-11 DIAGNOSIS — R410 Disorientation, unspecified: Secondary | ICD-10-CM | POA: Diagnosis not present

## 2018-08-11 DIAGNOSIS — S060X0A Concussion without loss of consciousness, initial encounter: Secondary | ICD-10-CM | POA: Diagnosis not present

## 2018-08-11 NOTE — ED Provider Notes (Signed)
[facility] Provider Note   CSN: [id number] Arrival date & time: 08/11/18  0009     History   Chief Complaint Chief Complaint  Patient presents with  . Fall   Level 5 caveat due to dementia HPI Joseph Mora is a 84 y.o. male.  The history is provided by the patient and the spouse. The history is limited by the condition of the patient.  Fall  This is a new problem. The problem occurs constantly. The problem has not changed since onset.Associated symptoms include headaches. Nothing aggravates the symptoms. Nothing relieves the symptoms.   Patient with history of CAD, dementia, diabetes presents after fall.  Patient lives in a nursing facility.  Patient was standing at nursing station he fell backward.  He reports hitting his head and having headache.  He also reports back pain.  Unknown LOC He is not on anticoagulants  Wife at bedside reports patient always has back pain.  No other acute complaint Past Medical History:  Diagnosis Date  . Arthritis    RA  . CAD (coronary artery disease)   . Diabetes mellitus   . Hyperlipidemia   . Memory difficulty 06/22/2015  . Myocardial infarction ([place])   . Tremor 06/22/2015   Intentional, right hand    Patient Active Problem List   Diagnosis Date Noted  . Dehydration 02/24/2018  . Hypoglycemia 10/14/2015  . CAD in native artery   . Memory difficulty 06/22/2015  . Tremor 06/22/2015  . Premature ventricular contractions 06/22/2015  . Essential hypertension 05/23/2012  . Benign prostatic hyperplasia 04/21/2011  . Chronic back pain 04/21/2011  . Insulin dependent diabetes mellitus ([place]) 04/21/2011  . Hx of adenomatous colonic polyps 04/21/2011  . History of Helicobacter infection 04/21/2011  . CAD (coronary artery disease) 04/03/2011  . Hyperlipidemia 04/03/2011    Past Surgical History:  Procedure Laterality Date  . BACK SURGERY    . CARPAL TUNNEL RELEASE Bilateral   . CATARACT  EXTRACTION Bilateral   . INGUINAL HERNIA REPAIR    . PARATHYROIDECTOMY          Home Medications    Prior to Admission medications   Medication Sig Start Date End Date Taking? Authorizing Provider  aspirin (ASPIRIN 81) 81 MG EC tablet Take 81 mg by mouth daily. Swallow whole.    [provider]  atorvastatin (LIPITOR) 40 MG tablet TAKE 1 TABLET BY MOUTH DAILY 09/11/17   Elby Showers, MD  buPROPion (WELLBUTRIN XL) 150 MG 24 hr tablet TAKE 1 TABLET(150 MG) BY MOUTH DAILY 06/01/18   Elby Showers, MD  donepezil (ARICEPT) 10 MG tablet TAKE 1 TABLET(10 MG) BY MOUTH AT BEDTIME 11/19/17   Ward Givens, NP  ibuprofen (ADVIL,MOTRIN) 200 MG tablet Take 200 mg by mouth 2 (two) times daily as needed.    [provider]  insulin NPH-regular Human (NOVOLIN 70/30) (70-30) 100 UNIT/ML injection Inject 50 u units before breakfast, 10 units before lunch and 30 units before supper. (hold if CBG is <100)    [provider]  memantine (NAMENDA) 10 MG tablet TAKE 1 TABLET(10 MG) BY MOUTH TWICE DAILY 07/01/18   Elby Showers, MD  metoprolol tartrate (LOPRESSOR) 50 MG tablet TAKE 1 TABLET(50 MG) BY MOUTH TWICE DAILY 07/01/18   Elby Showers, MD  mirabegron ER (MYRBETRIQ) 50 MG TB24 tablet Take 50 mg by mouth daily.    [provider]  nitroGLYCERIN (NITROSTAT) 0.4 MG SL tablet Place 1 tablet (0.4  mg total) under the tongue every 5 (five) minutes as needed for chest pain. 07/31/16   Elby Showers, MD  sertraline (ZOLOFT) 50 MG tablet TAKE 1 TABLET(50 MG) BY MOUTH DAILY 07/01/18   Elby Showers, MD  tamsulosin (FLOMAX) 0.4 MG CAPS capsule TAKE 1 CAPSULE BY MOUTH EVERY DAY 03/28/18   Elby Showers, MD  vitamin B-12 100 MCG tablet Take 1 tablet (100 mcg total) by mouth daily. 06/01/15   Florencia Reasons, MD    Family History Family History  Problem Relation Age of Onset  . Heart attack Paternal Aunt   . Stroke Mother   . Diabetes Mother   . Dementia Mother   . Diabetes Sister     . Stroke Sister   . Dementia Sister   . Diabetes Brother   . Dementia Brother   . Hypertension Neg Hx     Social History Social History   Tobacco Use  . Smoking status: Former Smoker    Last attempt to quit: 08/03/2010    Years since quitting: 8.0  . Smokeless tobacco: Current User    Types: Chew  Substance Use Topics  . Alcohol use: No  . Drug use: No     Allergies   Morphine and related and Penicillins   Review of Systems Review of Systems  Unable to perform ROS: Dementia  Neurological: Positive for headaches.     Physical Exam Updated Vital Signs BP 134/75   Pulse 73   Temp 98.5 F (36.9 C)   Resp (!) 21   SpO2 95%   Physical Exam  CONSTITUTIONAL: Elderly, no acute distress HEAD: Normocephalic/atraumatic,no lacerations noted EYES: EOMI/PERRL ENMT: Mucous membranes moist, no visible trauma NECK: supple no meningeal signs SPINE/BACK:entire spine nontender, no bruising/crepitance/stepoffs noted to spine CV: S1/S2 noted LUNGS: Lungs are clear to auscultation bilaterally, no apparent distress ABDOMEN: soft, nontender NEURO: Pt is awake/alert moves all extremitiesx4.  No facial droop.   Patient is interactive but appears confused.  There are no gross motor deficits noted EXTREMITIES: pulses normal/equal, full ROM, pelvis stable All extremities/joints palpated/ranged and nontender SKIN: warm, color normal   ED Treatments / Results  Labs (all labs ordered are listed, but only abnormal results are displayed) Labs Reviewed - No data to display  EKG None  Radiology Ct Head Wo Contrast  Result Date: 08/11/2018 CLINICAL DATA:  Head and back pain after a fall. EXAM: CT HEAD WITHOUT CONTRAST CT CERVICAL SPINE WITHOUT CONTRAST TECHNIQUE: Multidetector CT imaging of the head and cervical spine was performed following the standard protocol without intravenous contrast. Multiplanar CT image reconstructions of the cervical spine were also generated.  COMPARISON:  CT head 03/10/2018 FINDINGS: CT HEAD FINDINGS Brain: Diffuse cerebral atrophy. Ventricular dilatation consistent with central atrophy. Low-attenuation changes in the deep white matter consistent small vessel ischemia. No mass-effect or midline shift. No abnormal extra-axial fluid collections. Gray-white matter junctions are distinct. Basal cisterns are not effaced. No acute intracranial hemorrhage. Vascular: Moderate intracranial arterial vascular calcifications. Skull: Calvarium appears intact. No acute depressed skull fractures. Sinuses/Orbits: Paranasal sinuses and mastoid air cells are clear. Old right medial orbital wall deformity likely representing old fracture. Other: None. CT CERVICAL SPINE FINDINGS Alignment: Reversal of the usual cervical lordosis without anterior subluxation. This is likely due to patient positioning but ligamentous injury or muscle spasm could also have this appearance and are not excluded. Normal alignment of the facet joints. C1-2 articulation appears intact. Skull base and vertebrae: Skull base appears intact. No  vertebral compression deformities. Focal sclerosis in the left pedicle at C3. This is nonspecific and probably represents a benign bone island although sclerotic metastasis is not entirely excluded. No other focal bone lesions identified. Soft tissues and spinal canal: No prevertebral soft tissue swelling. No abnormal paraspinal soft tissue mass or infiltration. Dystrophic calcification posterior to the spinous processes at C5 and C6. Disc levels: Degenerative changes throughout the cervical spine with narrowed interspaces and endplate hypertrophic changes. Prominent degenerative changes at C1 to. Degenerative changes in the facet joints. Upper chest: Lung apices are clear. Other: Vascular calcifications. IMPRESSION: 1. No acute intracranial abnormalities. Chronic atrophy and small vessel ischemic changes. 2. Nonspecific reversal of the usual cervical lordosis.  Degenerative changes throughout the cervical spine. No acute displaced fractures identified. Sclerosis in the left pedicle at C3 likely representing benign bone island. Electronically Signed   By: Lucienne Capers M.D.   On: 08/11/2018 01:31   Ct Cervical Spine Wo Contrast  Result Date: 08/11/2018 CLINICAL DATA:  Head and back pain after a fall. EXAM: CT HEAD WITHOUT CONTRAST CT CERVICAL SPINE WITHOUT CONTRAST TECHNIQUE: Multidetector CT imaging of the head and cervical spine was performed following the standard protocol without intravenous contrast. Multiplanar CT image reconstructions of the cervical spine were also generated. COMPARISON:  CT head 03/10/2018 FINDINGS: CT HEAD FINDINGS Brain: Diffuse cerebral atrophy. Ventricular dilatation consistent with central atrophy. Low-attenuation changes in the deep white matter consistent small vessel ischemia. No mass-effect or midline shift. No abnormal extra-axial fluid collections. Gray-white matter junctions are distinct. Basal cisterns are not effaced. No acute intracranial hemorrhage. Vascular: Moderate intracranial arterial vascular calcifications. Skull: Calvarium appears intact. No acute depressed skull fractures. Sinuses/Orbits: Paranasal sinuses and mastoid air cells are clear. Old right medial orbital wall deformity likely representing old fracture. Other: None. CT CERVICAL SPINE FINDINGS Alignment: Reversal of the usual cervical lordosis without anterior subluxation. This is likely due to patient positioning but ligamentous injury or muscle spasm could also have this appearance and are not excluded. Normal alignment of the facet joints. C1-2 articulation appears intact. Skull base and vertebrae: Skull base appears intact. No vertebral compression deformities. Focal sclerosis in the left pedicle at C3. This is nonspecific and probably represents a benign bone island although sclerotic metastasis is not entirely excluded. No other focal bone lesions  identified. Soft tissues and spinal canal: No prevertebral soft tissue swelling. No abnormal paraspinal soft tissue mass or infiltration. Dystrophic calcification posterior to the spinous processes at C5 and C6. Disc levels: Degenerative changes throughout the cervical spine with narrowed interspaces and endplate hypertrophic changes. Prominent degenerative changes at C1 to. Degenerative changes in the facet joints. Upper chest: Lung apices are clear. Other: Vascular calcifications. IMPRESSION: 1. No acute intracranial abnormalities. Chronic atrophy and small vessel ischemic changes. 2. Nonspecific reversal of the usual cervical lordosis. Degenerative changes throughout the cervical spine. No acute displaced fractures identified. Sclerosis in the left pedicle at C3 likely representing benign bone island. Electronically Signed   By: Lucienne Capers M.D.   On: 08/11/2018 01:31    Procedures Procedures (including critical care time)  Medications Ordered in ED Medications - No data to display   Initial Impression / Assessment and Plan / ED Course  I have reviewed the triage vital signs and the nursing notes.  Pertinent imaging results that were available during my care of the patient were reviewed by me and considered in my medical decision making (see chart for details).     PT presents after  fall.  No signs of any head injury.  I was unable to clear C-spine, CT C-spine is negative.  No signs of any other spinal trauma, no signs of chest/abdominal trauma.  No extremity trauma. Patient moving around the bed in no distress.  Patient will be discharged back to facility Final Clinical Impressions(s) / ED Diagnoses   Final diagnoses:  Fall, initial encounter  Injury of head, initial encounter    ED Discharge Orders    None       Ripley Fraise, MD 08/11/18 0214

## 2018-08-11 NOTE — ED Notes (Signed)
Pt in CT.

## 2018-08-11 NOTE — ED Notes (Signed)
Patient transported to CT 

## 2018-08-11 NOTE — ED Notes (Signed)
PTAR called for transport. AH

## 2018-08-11 NOTE — ED Triage Notes (Addendum)
Per PTAR, Pt from [facility]. Pt had a fall. Pt was standing at nurses station and he fell backwards. Pt complaining of pain to head and back. Pt confused with hx of memory impairment. Unable to obtain history from pt. Pt alert, disoriented to time, situation, and place. PTAR reports hematoma to posterior head, no other obvious injuries noted. Pt is not currently on blood thinners

## 2018-08-13 DIAGNOSIS — E785 Hyperlipidemia, unspecified: Secondary | ICD-10-CM | POA: Diagnosis not present

## 2018-08-13 DIAGNOSIS — M545 Low back pain: Secondary | ICD-10-CM | POA: Diagnosis not present

## 2018-08-13 DIAGNOSIS — R269 Unspecified abnormalities of gait and mobility: Secondary | ICD-10-CM | POA: Diagnosis not present

## 2018-08-13 DIAGNOSIS — M6281 Muscle weakness (generalized): Secondary | ICD-10-CM | POA: Diagnosis not present

## 2018-08-13 DIAGNOSIS — I2510 Atherosclerotic heart disease of native coronary artery without angina pectoris: Secondary | ICD-10-CM | POA: Diagnosis not present

## 2018-08-13 DIAGNOSIS — Z794 Long term (current) use of insulin: Secondary | ICD-10-CM | POA: Diagnosis not present

## 2018-08-13 DIAGNOSIS — R079 Chest pain, unspecified: Secondary | ICD-10-CM | POA: Diagnosis not present

## 2018-08-13 DIAGNOSIS — E119 Type 2 diabetes mellitus without complications: Secondary | ICD-10-CM | POA: Diagnosis not present

## 2018-08-13 DIAGNOSIS — Z7982 Long term (current) use of aspirin: Secondary | ICD-10-CM | POA: Diagnosis not present

## 2018-08-16 DIAGNOSIS — N400 Enlarged prostate without lower urinary tract symptoms: Secondary | ICD-10-CM | POA: Diagnosis not present

## 2018-08-16 DIAGNOSIS — M545 Low back pain: Secondary | ICD-10-CM | POA: Diagnosis not present

## 2018-08-16 DIAGNOSIS — I25118 Atherosclerotic heart disease of native coronary artery with other forms of angina pectoris: Secondary | ICD-10-CM | POA: Diagnosis not present

## 2018-08-16 DIAGNOSIS — E119 Type 2 diabetes mellitus without complications: Secondary | ICD-10-CM | POA: Diagnosis not present

## 2018-08-23 ENCOUNTER — Emergency Department (HOSPITAL_COMMUNITY)
Admission: EM | Admit: 2018-08-23 | Discharge: 2018-08-23 | Disposition: A | Discharge status: Home / Self Care | Payer: PPO | Attending: Emergency Medicine | Admitting: Emergency Medicine

## 2018-08-23 ENCOUNTER — Encounter (HOSPITAL_COMMUNITY): Payer: Self-pay | Admitting: Internal Medicine

## 2018-08-23 ENCOUNTER — Other Ambulatory Visit: Payer: PPO | Admitting: Internal Medicine

## 2018-08-23 ENCOUNTER — Emergency Department (HOSPITAL_COMMUNITY): Payer: PPO

## 2018-08-23 DIAGNOSIS — R279 Unspecified lack of coordination: Secondary | ICD-10-CM | POA: Diagnosis not present

## 2018-08-23 DIAGNOSIS — F0391 Unspecified dementia with behavioral disturbance: Secondary | ICD-10-CM | POA: Diagnosis not present

## 2018-08-23 DIAGNOSIS — Z743 Need for continuous supervision: Secondary | ICD-10-CM | POA: Diagnosis not present

## 2018-08-23 DIAGNOSIS — I2510 Atherosclerotic heart disease of native coronary artery without angina pectoris: Secondary | ICD-10-CM | POA: Insufficient documentation

## 2018-08-23 DIAGNOSIS — F0390 Unspecified dementia without behavioral disturbance: Secondary | ICD-10-CM | POA: Diagnosis not present

## 2018-08-23 DIAGNOSIS — Z79899 Other long term (current) drug therapy: Secondary | ICD-10-CM | POA: Diagnosis not present

## 2018-08-23 DIAGNOSIS — W19XXXA Unspecified fall, initial encounter: Secondary | ICD-10-CM | POA: Diagnosis not present

## 2018-08-23 DIAGNOSIS — R0902 Hypoxemia: Secondary | ICD-10-CM | POA: Diagnosis not present

## 2018-08-23 DIAGNOSIS — I4510 Unspecified right bundle-branch block: Secondary | ICD-10-CM | POA: Diagnosis not present

## 2018-08-23 DIAGNOSIS — E785 Hyperlipidemia, unspecified: Secondary | ICD-10-CM | POA: Diagnosis not present

## 2018-08-23 DIAGNOSIS — Z87891 Personal history of nicotine dependence: Secondary | ICD-10-CM | POA: Diagnosis not present

## 2018-08-23 DIAGNOSIS — E119 Type 2 diabetes mellitus without complications: Secondary | ICD-10-CM | POA: Diagnosis not present

## 2018-08-23 DIAGNOSIS — R404 Transient alteration of awareness: Secondary | ICD-10-CM | POA: Diagnosis not present

## 2018-08-23 DIAGNOSIS — R456 Violent behavior: Secondary | ICD-10-CM | POA: Diagnosis not present

## 2018-08-23 DIAGNOSIS — E1165 Type 2 diabetes mellitus with hyperglycemia: Secondary | ICD-10-CM | POA: Diagnosis not present

## 2018-08-23 DIAGNOSIS — W010XXA Fall on same level from slipping, tripping and stumbling without subsequent striking against object, initial encounter: Secondary | ICD-10-CM

## 2018-08-23 LAB — COMPREHENSIVE METABOLIC PANEL
ALT: 31 U/L (ref 0–44)
AST: 35 U/L (ref 15–41)
Albumin: 3.2 g/dL — ABNORMAL LOW (ref 3.5–5.0)
Alkaline Phosphatase: 91 U/L (ref 38–126)
Anion gap: 7 (ref 5–15)
BUN: 17 mg/dL (ref 8–23)
CALCIUM: 8.3 mg/dL — AB (ref 8.9–10.3)
CO2: 27 mmol/L (ref 22–32)
Chloride: 102 mmol/L (ref 98–111)
Creatinine, Ser: 1.1 mg/dL (ref 0.61–1.24)
GFR calc non Af Amer: >60 mL/min (ref >60)
Glucose, Bld: 231 mg/dL — ABNORMAL HIGH (ref 70–99)
Potassium: 4.9 mmol/L (ref 3.5–5.1)
Sodium: 136 mmol/L (ref 135–145)
Total Bilirubin: 1.5 mg/dL — ABNORMAL HIGH (ref 0.3–1.2)
Total Protein: 5.7 g/dL — ABNORMAL LOW (ref 6.5–8.1)

## 2018-08-23 LAB — CBC
HCT: 47.4 % (ref 39.0–52.0)
HEMOGLOBIN: 15.7 g/dL (ref 13.0–17.0)
MCH: 32.2 pg (ref 26.0–34.0)
MCHC: 33.1 g/dL (ref 30.0–36.0)
MCV: 97.3 fL (ref 80.0–100.0)
Platelets: 116 10*3/uL — ABNORMAL LOW (ref 150–400)
RBC: 4.87 MIL/uL (ref 4.22–5.81)
RDW: 13.7 % (ref 11.5–15.5)
WBC: 7.3 10*3/uL (ref 4.0–10.5)
nRBC: 0 % (ref 0.0–0.2)

## 2018-08-23 MED ORDER — LORAZEPAM 1 MG PO TABS
1.0000 mg | ORAL_TABLET | Freq: Once | ORAL | Status: AC
  Administered 2018-08-23: 1 mg via ORAL
  Filled 2018-08-23: qty 1

## 2018-08-23 MED ORDER — ZIPRASIDONE MESYLATE 20 MG IM SOLR
5.0000 mg | Freq: Once | INTRAMUSCULAR | Status: AC
  Administered 2018-08-23: 5 mg via INTRAMUSCULAR
  Filled 2018-08-23: qty 20

## 2018-08-23 NOTE — ED Notes (Addendum)
Patient transported to CT with EMT [facility]

## 2018-08-23 NOTE — ED Provider Notes (Signed)
[facility] EMERG[facility]Y DEPARTMENT Provider Note   CSN: [id number] Arrival date & time: 08/23/18  1227     History   Chief Complaint Chief Complaint  Patient presents with  . Aggressive Behavior  . Fall    HPI Joseph Mora is a 84 y.o. male.  Patient from ecf/dementia care unit, with report of recent falls, and periods of agitated behavior. Pt with advanced dementia at baseline and can provide no history - level 5 caveat. No reported loc. No report of fevers.   The history is provided by the patient, a relative and the EMS personnel. The history is limited by the condition of the patient.  Fall     Past Medical History:  Diagnosis Date  . Arthritis    RA  . CAD (coronary artery disease)   . Diabetes mellitus   . Hyperlipidemia   . Memory difficulty 06/22/2015  . Myocardial infarction ([place])   . Tremor 06/22/2015   Intentional, right hand    Patient Active Problem List   Diagnosis Date Noted  . Dehydration 02/24/2018  . Hypoglycemia 10/14/2015  . CAD in native artery   . Memory difficulty 06/22/2015  . Tremor 06/22/2015  . Premature ventricular contractions 06/22/2015  . Essential hypertension 05/23/2012  . Benign prostatic hyperplasia 04/21/2011  . Chronic back pain 04/21/2011  . Insulin dependent diabetes mellitus ([place]) 04/21/2011  . Hx of adenomatous colonic polyps 04/21/2011  . History of Helicobacter infection 04/21/2011  . CAD (coronary artery disease) 04/03/2011  . Hyperlipidemia 04/03/2011    Past Surgical History:  Procedure Laterality Date  . BACK SURGERY    . CARPAL TUNNEL RELEASE Bilateral   . CATARACT EXTRACTION Bilateral   . INGUINAL HERNIA REPAIR    . PARATHYROIDECTOMY          Home Medications    Prior to Admission medications   Medication Sig Start Date End Date Taking? Authorizing Provider  aspirin (ASPIRIN 81) 81 MG EC tablet Take 81 mg by mouth daily. Swallow whole.    [provider]  atorvastatin  (LIPITOR) 40 MG tablet TAKE 1 TABLET BY MOUTH DAILY 09/11/17   Elby Showers, MD  buPROPion (WELLBUTRIN XL) 150 MG 24 hr tablet TAKE 1 TABLET(150 MG) BY MOUTH DAILY 06/01/18   Elby Showers, MD  donepezil (ARICEPT) 10 MG tablet TAKE 1 TABLET(10 MG) BY MOUTH AT BEDTIME 11/19/17   Ward Givens, NP  ibuprofen (ADVIL,MOTRIN) 200 MG tablet Take 200 mg by mouth 2 (two) times daily as needed.    [provider]  insulin NPH-regular Human (NOVOLIN 70/30) (70-30) 100 UNIT/ML injection Inject 50 u units before breakfast, 10 units before lunch and 30 units before supper. (hold if CBG is <100)    [provider]  memantine (NAMENDA) 10 MG tablet TAKE 1 TABLET(10 MG) BY MOUTH TWICE DAILY 07/01/18   Elby Showers, MD  metoprolol tartrate (LOPRESSOR) 50 MG tablet TAKE 1 TABLET(50 MG) BY MOUTH TWICE DAILY 07/01/18   Elby Showers, MD  mirabegron ER (MYRBETRIQ) 50 MG TB24 tablet Take 50 mg by mouth daily.    [provider]  nitroGLYCERIN (NITROSTAT) 0.4 MG SL tablet Place 1 tablet (0.4 mg total) under the tongue every 5 (five) minutes as needed for chest pain. 07/31/16   Elby Showers, MD  sertraline (ZOLOFT) 50 MG tablet TAKE 1 TABLET(50 MG) BY MOUTH DAILY 07/01/18   Elby Showers, MD  tamsulosin (FLOMAX) 0.4 MG CAPS capsule TAKE 1  CAPSULE BY MOUTH EVERY DAY 03/28/18   Elby Showers, MD  vitamin B-12 100 MCG tablet Take 1 tablet (100 mcg total) by mouth daily. 06/01/15   Florencia Reasons, MD    Family History Family History  Problem Relation Age of Onset  . Heart attack Paternal Aunt   . Stroke Mother   . Diabetes Mother   . Dementia Mother   . Diabetes Sister   . Stroke Sister   . Dementia Sister   . Diabetes Brother   . Dementia Brother   . Hypertension Neg Hx     Social History Social History   Tobacco Use  . Smoking status: Former Smoker    Last attempt to quit: 08/03/2010    Years since quitting: 8.0  . Smokeless tobacco: Current User    Types: Chew  Substance Use  Topics  . Alcohol use: No  . Drug use: No     Allergies   Morphine and related and Penicillins   Review of Systems Review of Systems  Unable to perform ROS: Dementia  level 5 caveat      Physical Exam Updated Vital Signs BP 124/69   Pulse 86   Temp 97.7 F (36.5 C) (Axillary)   Resp (!) 22   SpO2 95%   Physical Exam  Constitutional: He appears well-developed and well-nourished.  HENT:  Mouth/Throat: Oropharynx is clear and moist.  Eyes: Pupils are equal, round, and reactive to light. Conjunctivae are normal.  Neck: Normal range of motion. Neck supple. No tracheal deviation present.  Cardiovascular: Normal rate, regular rhythm, normal heart sounds and intact distal pulses.  Pulmonary/Chest: Effort normal and breath sounds normal. No accessory muscle usage. No respiratory distress.  Abdominal: Soft. Bowel sounds are normal. He exhibits no distension. There is no tenderness.  Genitourinary:  Genitourinary Comments: Normal external gu exam. No cva tenderness.   Musculoskeletal: He exhibits no edema.  CTLS spine, non tender, aligned, no step off. Good rom bil extremities without pain or focal bony tenderness.   Neurological: He is alert.  Patient is awake and alert. Moves 4 extremities purposefully with good strength. Advanced dementia/confusion - mental status c/w baseline per family.   Skin: Skin is warm and dry. No rash noted.  Psychiatric:  Confused, pulling at leads, bp cuff.   Nursing note and vitals reviewed.    ED Treatments / Results  Labs (all labs ordered are listed, but only abnormal results are displayed) Results for orders placed or performed during the hospital encounter of 08/23/18  CBC  Result Value Ref Range   WBC 7.3 4.0 - 10.5 K/uL   RBC 4.87 4.22 - 5.81 MIL/uL   Hemoglobin 15.7 13.0 - 17.0 g/dL   HCT 47.4 39.0 - 52.0 %   MCV 97.3 80.0 - 100.0 fL   MCH 32.2 26.0 - 34.0 pg   MCHC 33.1 30.0 - 36.0 g/dL   RDW 13.7 11.5 - 15.5 %   Platelets  116 (L) 150 - 400 K/uL   nRBC 0.0 0.0 - 0.2 %  Comprehensive metabolic panel  Result Value Ref Range   Sodium 136 135 - 145 mmol/L   Potassium 4.9 3.5 - 5.1 mmol/L   Chloride 102 98 - 111 mmol/L   CO2 27 22 - 32 mmol/L   Glucose, Bld 231 (H) 70 - 99 mg/dL   BUN 17 8 - 23 mg/dL   Creatinine, Ser 1.10 0.61 - 1.24 mg/dL   Calcium 8.3 (L) 8.9 - 10.3 mg/dL  Total Protein 5.7 (L) 6.5 - 8.1 g/dL   Albumin 3.2 (L) 3.5 - 5.0 g/dL   AST 35 15 - 41 U/L   ALT 31 0 - 44 U/L   Alkaline Phosphatase 91 38 - 126 U/L   Total Bilirubin 1.5 (H) 0.3 - 1.2 mg/dL   GFR calc non Af Amer >60 >60 mL/min   GFR calc Af Amer >60 >60 mL/min   Anion gap 7 5 - 15    EKG EKG Interpretation  Date/Time:  Monday August 23 2018 12:29:52 EST Ventricular Rate:  87 PR Interval:    QRS Duration: 146 QT Interval:  421 QTC Calculation: 507 R Axis:   143 Text Interpretation:  Sinus rhythm RBBB and LPFB No significant change since last tracing Confirmed by Lajean Saver [id number]) on 08/23/2018 12:33:59 PM   Radiology Ct Head Wo Contrast  Result Date: 08/23/2018 CLINICAL DATA:  Multiple falls this past weekend.  Dementia. EXAM: CT HEAD WITHOUT CONTRAST TECHNIQUE: Contiguous axial images were obtained from the base of the skull through the vertex without intravenous contrast. COMPARISON:  08/11/2018 FINDINGS: Brain: Ventricles and cisterns are within normal. There is mild age related atrophic change. Mild chronic ischemic microvascular disease. No mass, mass effect, shift of midline structures or acute hemorrhage. No evidence of acute infarction. Subcentimeter hypodensity over the inferior left cerebellar hemisphere likely prominent CSF space versus old lacunar infarct. Vascular: No hyperdense vessel or unexpected calcification. Skull: Normal. Negative for fracture or focal lesion. Sinuses/Orbits: No acute finding. Other: None. IMPRESSION: No acute findings. Mild atrophy and chronic ischemic microvascular disease. Stable  possible subcentimeter CSF space versus old lacunar infarct left cerebellar hemisphere. Electronically Signed   By: Marin Olp M.D.   On: 08/23/2018 15:12    Procedures Procedures (including critical care time)  Medications Ordered in ED Medications - No data to display   Initial Impression / Assessment and Plan / ED Course  I have reviewed the triage vital signs and the nursing notes.  Pertinent labs & imaging results that were available during my care of the patient were reviewed by me and considered in my medical decision making (see chart for details).  Labs ordered.   Pt pulling at leads/iv. Restraint for safety. Pt w agitation, unable to get ct. geodon 5 mg im for symptom improvement.   Reviewed nursing notes and prior charts for additional history.   Labs reviewed - chem normal, glucose mildly elev.   Ct reviewed - neg acute.  Recheck pt, calm and alert. pts mental status c/w baseline. Afebrile.  Pt currently appears stable for d/c back to ecf.     Final Clinical Impressions(s) / ED Diagnoses   Final diagnoses:  None    ED Discharge Orders    None       Lajean Saver, MD 08/23/18 1525

## 2018-08-23 NOTE — ED Notes (Signed)
ED Provider at bedside. 

## 2018-08-23 NOTE — ED Notes (Signed)
Sister at the bedside

## 2018-08-23 NOTE — ED Triage Notes (Signed)
Pt here via [facility] from [facility] after multiple falls this weekend at facility. Staff at facility poor historians. Per staff, patient has been combative x2 days and pt combative for EMS. Hx dementia. Given 5mg  midazolam IM by EMS. Also given 0.25 ativan topical by facility staff at 1130 prior to EMS arrival. Able to move all extremities freely. Sister at the bedside.

## 2018-08-23 NOTE — ED Notes (Signed)
PAGED PTAR FOR PT TRANSPORT

## 2018-08-23 NOTE — ED Notes (Signed)
Pt returned to room from CT scan

## 2018-08-23 NOTE — ED Notes (Signed)
Called ptar again for patient pickup, Joseph Mora

## 2018-08-23 NOTE — ED Notes (Signed)
Patient verbalizes understanding of discharge instructions. Opportunity for questioning and answers were provided. Armband removed by staff, pt discharged from ED back to his facility via Ambulance.

## 2018-08-23 NOTE — ED Notes (Signed)
Patient placed on [facility] bed alarm.

## 2018-08-23 NOTE — Discharge Instructions (Addendum)
It was our pleasure to provide your ER care today - we hope that you feel better.  Fall precautions.  Follow up with primary care doctor in the next few days.  Return to ER if worse, new symptoms, fevers, trouble breathing, other concern.

## 2018-08-26 ENCOUNTER — Telehealth: Payer: Self-pay | Admitting: Neurology

## 2018-08-26 NOTE — Telephone Encounter (Signed)
Patient's wife presented to lobby stating patient is at [facility] facility in a lockdown unit. She is requesting records be faxed to them regarding diagnosis of dementia. Best call back is [phone].

## 2018-08-26 NOTE — Telephone Encounter (Signed)
Spoke with [name]. She sts. pt. is inpatient at [facility] and staff there are requesting confirmation that he has had difficulty with memory in the past.  Per wife's request, pt's last ov note (05/19/18) faxed to [facility], fax# [phone] (# given to me by Mrs. Patane)/fim

## 2018-09-05 ENCOUNTER — Encounter (HOSPITAL_COMMUNITY): Payer: Self-pay | Admitting: Emergency Medicine

## 2018-09-05 ENCOUNTER — Inpatient Hospital Stay (HOSPITAL_COMMUNITY)
Admission: EM | Admit: 2018-09-05 | Discharge: 2018-09-22 | DRG: 637 | Disposition: E | Payer: PPO | Attending: Internal Medicine | Admitting: Internal Medicine

## 2018-09-05 ENCOUNTER — Emergency Department (HOSPITAL_COMMUNITY): Payer: PPO

## 2018-09-05 DIAGNOSIS — E119 Type 2 diabetes mellitus without complications: Secondary | ICD-10-CM

## 2018-09-05 DIAGNOSIS — E161 Other hypoglycemia: Secondary | ICD-10-CM | POA: Diagnosis not present

## 2018-09-05 DIAGNOSIS — N179 Acute kidney failure, unspecified: Secondary | ICD-10-CM | POA: Diagnosis not present

## 2018-09-05 DIAGNOSIS — R251 Tremor, unspecified: Secondary | ICD-10-CM | POA: Diagnosis not present

## 2018-09-05 DIAGNOSIS — I4510 Unspecified right bundle-branch block: Secondary | ICD-10-CM | POA: Diagnosis not present

## 2018-09-05 DIAGNOSIS — Z87891 Personal history of nicotine dependence: Secondary | ICD-10-CM

## 2018-09-05 DIAGNOSIS — R4020 Unspecified coma: Secondary | ICD-10-CM | POA: Diagnosis not present

## 2018-09-05 DIAGNOSIS — Z66 Do not resuscitate: Secondary | ICD-10-CM | POA: Diagnosis present

## 2018-09-05 DIAGNOSIS — E785 Hyperlipidemia, unspecified: Secondary | ICD-10-CM | POA: Diagnosis not present

## 2018-09-05 DIAGNOSIS — M069 Rheumatoid arthritis, unspecified: Secondary | ICD-10-CM | POA: Diagnosis present

## 2018-09-05 DIAGNOSIS — F0391 Unspecified dementia with behavioral disturbance: Secondary | ICD-10-CM | POA: Diagnosis present

## 2018-09-05 DIAGNOSIS — E1165 Type 2 diabetes mellitus with hyperglycemia: Secondary | ICD-10-CM | POA: Diagnosis not present

## 2018-09-05 DIAGNOSIS — E162 Hypoglycemia, unspecified: Secondary | ICD-10-CM | POA: Diagnosis not present

## 2018-09-05 DIAGNOSIS — Z833 Family history of diabetes mellitus: Secondary | ICD-10-CM

## 2018-09-05 DIAGNOSIS — Z7401 Bed confinement status: Secondary | ICD-10-CM

## 2018-09-05 DIAGNOSIS — Z8711 Personal history of peptic ulcer disease: Secondary | ICD-10-CM | POA: Diagnosis not present

## 2018-09-05 DIAGNOSIS — E870 Hyperosmolality and hypernatremia: Secondary | ICD-10-CM | POA: Diagnosis not present

## 2018-09-05 DIAGNOSIS — Z9181 History of falling: Secondary | ICD-10-CM | POA: Diagnosis not present

## 2018-09-05 DIAGNOSIS — Z8249 Family history of ischemic heart disease and other diseases of the circulatory system: Secondary | ICD-10-CM

## 2018-09-05 DIAGNOSIS — N400 Enlarged prostate without lower urinary tract symptoms: Secondary | ICD-10-CM | POA: Diagnosis not present

## 2018-09-05 DIAGNOSIS — Z7982 Long term (current) use of aspirin: Secondary | ICD-10-CM

## 2018-09-05 DIAGNOSIS — I2510 Atherosclerotic heart disease of native coronary artery without angina pectoris: Secondary | ICD-10-CM | POA: Diagnosis present

## 2018-09-05 DIAGNOSIS — R4182 Altered mental status, unspecified: Secondary | ICD-10-CM | POA: Diagnosis not present

## 2018-09-05 DIAGNOSIS — I10 Essential (primary) hypertension: Secondary | ICD-10-CM | POA: Diagnosis present

## 2018-09-05 DIAGNOSIS — G9341 Metabolic encephalopathy: Secondary | ICD-10-CM | POA: Diagnosis present

## 2018-09-05 DIAGNOSIS — Z993 Dependence on wheelchair: Secondary | ICD-10-CM | POA: Diagnosis not present

## 2018-09-05 DIAGNOSIS — D72829 Elevated white blood cell count, unspecified: Secondary | ICD-10-CM | POA: Diagnosis present

## 2018-09-05 DIAGNOSIS — Z515 Encounter for palliative care: Secondary | ICD-10-CM | POA: Diagnosis not present

## 2018-09-05 DIAGNOSIS — Z681 Body mass index (BMI) 19 or less, adult: Secondary | ICD-10-CM | POA: Diagnosis not present

## 2018-09-05 DIAGNOSIS — I252 Old myocardial infarction: Secondary | ICD-10-CM | POA: Diagnosis not present

## 2018-09-05 DIAGNOSIS — R404 Transient alteration of awareness: Secondary | ICD-10-CM | POA: Diagnosis not present

## 2018-09-05 DIAGNOSIS — E11649 Type 2 diabetes mellitus with hypoglycemia without coma: Principal | ICD-10-CM | POA: Diagnosis present

## 2018-09-05 DIAGNOSIS — Z7189 Other specified counseling: Secondary | ICD-10-CM | POA: Diagnosis not present

## 2018-09-05 DIAGNOSIS — R64 Cachexia: Secondary | ICD-10-CM | POA: Diagnosis not present

## 2018-09-05 DIAGNOSIS — E872 Acidosis: Secondary | ICD-10-CM | POA: Diagnosis present

## 2018-09-05 DIAGNOSIS — Z880 Allergy status to penicillin: Secondary | ICD-10-CM

## 2018-09-05 DIAGNOSIS — R410 Disorientation, unspecified: Secondary | ICD-10-CM | POA: Diagnosis present

## 2018-09-05 DIAGNOSIS — R0902 Hypoxemia: Secondary | ICD-10-CM

## 2018-09-05 DIAGNOSIS — Z885 Allergy status to narcotic agent status: Secondary | ICD-10-CM

## 2018-09-05 DIAGNOSIS — Z79899 Other long term (current) drug therapy: Secondary | ICD-10-CM

## 2018-09-05 DIAGNOSIS — E860 Dehydration: Secondary | ICD-10-CM | POA: Diagnosis not present

## 2018-09-05 DIAGNOSIS — IMO0001 Reserved for inherently not codable concepts without codable children: Secondary | ICD-10-CM

## 2018-09-05 DIAGNOSIS — Z794 Long term (current) use of insulin: Secondary | ICD-10-CM

## 2018-09-05 LAB — BASIC METABOLIC PANEL
Anion gap: 11 (ref 5–15)
BUN: 42 mg/dL — ABNORMAL HIGH (ref 8–23)
CO2: 26 mmol/L (ref 22–32)
Calcium: 8.2 mg/dL — ABNORMAL LOW (ref 8.9–10.3)
Chloride: 113 mmol/L — ABNORMAL HIGH (ref 98–111)
Creatinine, Ser: 1.2 mg/dL (ref 0.61–1.24)
GFR calc Af Amer: >60 mL/min (ref >60)
GFR calc non Af Amer: 56 mL/min — ABNORMAL LOW (ref >60)
Glucose, Bld: 105 mg/dL — ABNORMAL HIGH (ref 70–99)
POTASSIUM: 3.8 mmol/L (ref 3.5–5.1)
Sodium: 150 mmol/L — ABNORMAL HIGH (ref 135–145)

## 2018-09-05 LAB — I-STAT CHEM 8, ED
BUN: 40 mg/dL — ABNORMAL HIGH (ref 8–23)
BUN: 41 mg/dL — ABNORMAL HIGH (ref 8–23)
Calcium, Ion: 1.08 mmol/L — ABNORMAL LOW (ref 1.15–1.40)
Calcium, Ion: 0.99 mmol/L — ABNORMAL LOW (ref 1.15–1.40)
Chloride: 114 mmol/L — ABNORMAL HIGH (ref 98–111)
Chloride: 113 mmol/L — ABNORMAL HIGH (ref 98–111)
Creatinine, Ser: 1.1 mg/dL (ref 0.61–1.24)
Creatinine, Ser: 1.1 mg/dL (ref 0.61–1.24)
GLUCOSE: 104 mg/dL — AB (ref 70–99)
Glucose, Bld: 209 mg/dL — ABNORMAL HIGH (ref 70–99)
HCT: 53 % — ABNORMAL HIGH (ref 39.0–52.0)
HCT: 49 % (ref 39.0–52.0)
Hemoglobin: 18 g/dL — ABNORMAL HIGH (ref 13.0–17.0)
Hemoglobin: 16.7 g/dL (ref 13.0–17.0)
Potassium: 3.2 mmol/L — ABNORMAL LOW (ref 3.5–5.1)
Potassium: 3.6 mmol/L (ref 3.5–5.1)
SODIUM: 154 mmol/L — AB (ref 135–145)
Sodium: 151 mmol/L — ABNORMAL HIGH (ref 135–145)
TCO2: 27 mmol/L (ref 22–32)
TCO2: 29 mmol/L (ref 22–32)

## 2018-09-05 LAB — CBC WITH DIFFERENTIAL/PLATELET
Abs Immature Granulocytes: 0.09 10*3/uL — ABNORMAL HIGH (ref 0.00–0.07)
Basophils Absolute: 0 10*3/uL (ref 0.0–0.1)
Basophils Relative: 0 %
Eosinophils Absolute: 0 10*3/uL (ref 0.0–0.5)
Eosinophils Relative: 0 %
HCT: 56.5 % — ABNORMAL HIGH (ref 39.0–52.0)
Hemoglobin: 17.4 g/dL — ABNORMAL HIGH (ref 13.0–17.0)
Immature Granulocytes: 1 %
LYMPHS PCT: 5 %
Lymphs Abs: 0.8 10*3/uL (ref 0.7–4.0)
MCH: 31.3 pg (ref 26.0–34.0)
MCHC: 30.8 g/dL (ref 30.0–36.0)
MCV: 101.6 fL — ABNORMAL HIGH (ref 80.0–100.0)
Monocytes Absolute: 1.6 10*3/uL — ABNORMAL HIGH (ref 0.1–1.0)
Monocytes Relative: 10 %
Neutro Abs: 13.7 10*3/uL — ABNORMAL HIGH (ref 1.7–7.7)
Neutrophils Relative %: 84 %
Platelets: 121 10*3/uL — ABNORMAL LOW (ref 150–400)
RBC: 5.56 MIL/uL (ref 4.22–5.81)
RDW: 13.9 % (ref 11.5–15.5)
WBC: 16.2 10*3/uL — ABNORMAL HIGH (ref 4.0–10.5)
nRBC: 0 % (ref 0.0–0.2)

## 2018-09-05 LAB — URINALYSIS, ROUTINE W REFLEX MICROSCOPIC
BILIRUBIN URINE: NEGATIVE
Glucose, UA: 50 mg/dL — AB
HGB URINE DIPSTICK: NEGATIVE
Ketones, ur: NEGATIVE mg/dL
Leukocytes, UA: NEGATIVE
NITRITE: NEGATIVE
Protein, ur: NEGATIVE mg/dL
Specific Gravity, Urine: 1.025 (ref 1.005–1.030)
pH: 5 (ref 5.0–8.0)

## 2018-09-05 LAB — COMPREHENSIVE METABOLIC PANEL
ALBUMIN: 2.8 g/dL — AB (ref 3.5–5.0)
ALT: 35 U/L (ref 0–44)
AST: 35 U/L (ref 15–41)
Alkaline Phosphatase: 91 U/L (ref 38–126)
Anion gap: 16 — ABNORMAL HIGH (ref 5–15)
BUN: 41 mg/dL — ABNORMAL HIGH (ref 8–23)
CO2: 25 mmol/L (ref 22–32)
Calcium: 8.5 mg/dL — ABNORMAL LOW (ref 8.9–10.3)
Chloride: 112 mmol/L — ABNORMAL HIGH (ref 98–111)
Creatinine, Ser: 1.29 mg/dL — ABNORMAL HIGH (ref 0.61–1.24)
GFR calc Af Amer: 59 mL/min — ABNORMAL LOW (ref >60)
GFR calc non Af Amer: 51 mL/min — ABNORMAL LOW (ref >60)
GLUCOSE: 218 mg/dL — AB (ref 70–99)
Potassium: 3.4 mmol/L — ABNORMAL LOW (ref 3.5–5.1)
Sodium: 153 mmol/L — ABNORMAL HIGH (ref 135–145)
Total Bilirubin: 0.9 mg/dL (ref 0.3–1.2)
Total Protein: 6.3 g/dL — ABNORMAL LOW (ref 6.5–8.1)

## 2018-09-05 LAB — SODIUM, URINE, RANDOM: Sodium, Ur: <10 mmol/L

## 2018-09-05 LAB — I-STAT CG4 LACTIC ACID, ED
Lactic Acid, Venous: 3.07 mmol/L (ref 0.5–1.9)
Lactic Acid, Venous: 2.26 mmol/L (ref 0.5–1.9)

## 2018-09-05 LAB — I-STAT TROPONIN, ED: Troponin i, poc: 0.02 ng/mL (ref 0.00–0.08)

## 2018-09-05 LAB — TSH: TSH: 0.785 u[IU]/mL (ref 0.350–4.500)

## 2018-09-05 LAB — HEMOGLOBIN A1C
Hgb A1c MFr Bld: 8.1 % — ABNORMAL HIGH (ref 4.8–5.6)
Mean Plasma Glucose: 185.77 mg/dL

## 2018-09-05 MED ORDER — DONEPEZIL HCL 10 MG PO TABS
10.0000 mg | ORAL_TABLET | Freq: Every day | ORAL | Status: DC
  Filled 2018-09-05: qty 1

## 2018-09-05 MED ORDER — VITAMIN B-12 100 MCG PO TABS
100.0000 ug | ORAL_TABLET | Freq: Every day | ORAL | Status: DC
  Filled 2018-09-05 (×2): qty 1

## 2018-09-05 MED ORDER — MEMANTINE HCL 10 MG PO TABS
10.0000 mg | ORAL_TABLET | Freq: Two times a day (BID) | ORAL | Status: DC
  Filled 2018-09-05: qty 1

## 2018-09-05 MED ORDER — INSULIN ASPART 100 UNIT/ML ~~LOC~~ SOLN: 3.0000 [IU] | Freq: Three times a day (TID) | SUBCUTANEOUS | Status: DC

## 2018-09-05 MED ORDER — SODIUM CHLORIDE 0.9% FLUSH
3.0000 mL | Freq: Two times a day (BID) | INTRAVENOUS | Status: DC
  Administered 2018-09-05 – 2018-09-06 (×2): 3 mL via INTRAVENOUS

## 2018-09-05 MED ORDER — ASPIRIN EC 81 MG PO TBEC
81.0000 mg | DELAYED_RELEASE_TABLET | Freq: Every day | ORAL | Status: DC
  Filled 2018-09-05: qty 1

## 2018-09-05 MED ORDER — ENOXAPARIN SODIUM 40 MG/0.4ML ~~LOC~~ SOLN
40.0000 mg | SUBCUTANEOUS | Status: DC
  Administered 2018-09-06 – 2018-09-08 (×3): 40 mg via SUBCUTANEOUS
  Filled 2018-09-05 (×3): qty 0.4

## 2018-09-05 MED ORDER — SODIUM CHLORIDE 0.9 % IV BOLUS
1000.0000 mL | Freq: Once | INTRAVENOUS | Status: AC
  Administered 2018-09-05: 1000 mL via INTRAVENOUS

## 2018-09-05 MED ORDER — METOPROLOL TARTRATE 50 MG PO TABS
50.0000 mg | ORAL_TABLET | Freq: Two times a day (BID) | ORAL | Status: DC
  Filled 2018-09-05 (×3): qty 1

## 2018-09-05 MED ORDER — ATORVASTATIN CALCIUM 40 MG PO TABS
40.0000 mg | ORAL_TABLET | Freq: Every day | ORAL | Status: DC
  Filled 2018-09-05: qty 1

## 2018-09-05 MED ORDER — INSULIN ASPART 100 UNIT/ML ~~LOC~~ SOLN: 0.0000 [IU] | Freq: Three times a day (TID) | SUBCUTANEOUS | Status: DC

## 2018-09-05 MED ORDER — SODIUM CHLORIDE 0.9 % IV SOLN
INTRAVENOUS | Status: DC
  Administered 2018-09-06: 01:00:00 via INTRAVENOUS

## 2018-09-05 MED ORDER — LORAZEPAM 2 MG/ML IJ SOLN: 0.5000 mg | INTRAMUSCULAR | Status: DC | PRN

## 2018-09-05 MED ORDER — SERTRALINE HCL 50 MG PO TABS: 50.0000 mg | ORAL_TABLET | Freq: Every day | ORAL | Status: DC

## 2018-09-05 NOTE — ED Triage Notes (Signed)
Pt here from [facility], staff at facility [facility], pt found around 1430 mildly unresponsive and his CBG was 32, faility attempted to give pt 2 tubes of oral glucose but it did not help because pt is unable to swallow. CBG 29 upon EMS arrival at 1606.  1 amp of D 50 given and 1mg  Glucagon.  Pt mumbles incomprehensible speech and is combative at baseline. CBG 257 at 1646.

## 2018-09-05 NOTE — ED Notes (Signed)
Phlebotomy notified of need for lactic acid plasma

## 2018-09-05 NOTE — ED Notes (Signed)
Label sent to main lab to add HGBA1C blood test.

## 2018-09-05 NOTE — ED Notes (Signed)
Pt linen, brief, and chux changed. Dressing applied to wounds on buttocks that were present prior to pt arrival to hospital. Activity mittens ordered.

## 2018-09-05 NOTE — ED Notes (Signed)
CBG 88 

## 2018-09-05 NOTE — H&P (Signed)
History and Physical   Joseph Mora OVZ:[id number] DOB: Jan 27, 1933 DOA: 09/08/2018  PCP: Elby Showers, MD  Chief Complaint: Hyperglycemia  HPI: This is an 84 year old male with medical problems including dementia requiring total care residing at a memory care unit, insulin-dependent diabetes, increased cardiovascular risk, and BPH presenting with hypoglycemia and increased confusion.  The patient is unable to provide history due to his baseline cognitive status, history is obtained via the patient's spouse who is present at the bedside as well as the patient's daughter.  Other information sources include review of the EMR as well as verbal report for the emergency medicine team.  There was no pertinent records at the bedside at time of my admission.  Patient transitioned to facility living in October 2019.  He transition to memory care unit in November 2019.  He has no dietary restrictions per the family report, however, there is concerned that he does not eat unless closely hand fed.  He does not walk independently, has frequent falls.  At baseline, he rambles incoherently, is not oriented to person place or time, frequently will fidget and do things with his hands to keep him active.  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  Needs assistance with all ADLs.  Family reports that he had peptic ulcer disease with surgery in the distant past.  They believe he has had a heart attack but no stents or open heart surgery.  They report noticing he has discoloration of his left eye that has not been present.  She reported that the patient's blood sugar dropped at the 20s at the facility, he was given multiple dextrose packets which upon recheck came up to the 30s but did not normalize.  Therefore was taken to the emergency department for further management.  ED Course: In the emergency department vital signs remarkable for heart rate of 104, nadir blood pressure of 97/53 which  upon most recent check of 112/70.  Other vital signs within normal limits.  Chest x-ray without acute findings.  CT scan of the head without contrast revealed no acute intracranial abnormality, did reveal chronic ischemic microangiopathy and old left cerebellar infarct.  Lab testing revealed initial blood work of sodium of 134, potassium 3.2, BUN and creatinine of 40 and 1.1 respectively.  Lactic acid on initial presentation of 3.07.  The patient's spouse reports that the patient drank 4 cups of water fairly quickly.  Repeat labs thereafter revealed sodium 151, lactic acid of 2.3.  CBC with leukocytosis 16.2 with neutrophil predominance, hemoglobin 17.4, MCV of 102, platelets of 121.  Hospital medicine consulted for further management, upon discussion emergency medicine team has ordered 1 L normal saline bolus.  Review of Systems: A complete ROS was not able to be obtained due to the patient's current mental status (dementia).  Past Medical History:  Diagnosis Date  . Arthritis    RA  . CAD (coronary artery disease)   . Diabetes mellitus   . Hyperlipidemia   . Memory difficulty 06/22/2015  . Myocardial infarction ([place])   . Tremor 06/22/2015   Intentional, right hand   Social History   Socioeconomic History  . Marital status: Married    Spouse name: Joseph Mora  . Number of children: 4  . Years of education: GED  . Highest education level: Not on file  Occupational History  . Occupation: retired  Scientific laboratory technician  . Financial resource strain: Not on file  . Food insecurity:    Worry: Not  on file    Inability: Not on file  . Transportation needs:    Medical: Not on file    Non-medical: Not on file  Tobacco Use  . Smoking status: Former Smoker    Last attempt to quit: 08/03/2010    Years since quitting: 8.0  . Smokeless tobacco: Current User    Types: Chew  Substance and Sexual Activity  . Alcohol use: No  . Drug use: No  . Sexual activity: Not on file  Lifestyle  . Physical activity:     Days per week: Not on file    Minutes per session: Not on file  . Stress: Not on file  Relationships  . Social connections:    Talks on phone: Not on file    Gets together: Not on file    Attends religious service: Not on file    Active member of club or organization: Not on file    Attends meetings of clubs or organizations: Not on file    Relationship status: Not on file  . Intimate partner violence:    Fear of current or ex partner: Not on file    Emotionally abused: Not on file    Physically abused: Not on file    Forced sexual activity: Not on file  Other Topics Concern  . Not on file  Social History Narrative   Patient drinks 3 cups of caffeine daily.   Patient is right handed.    Family History  Problem Relation Age of Onset  . Heart attack Paternal Aunt   . Stroke Mother   . Diabetes Mother   . Dementia Mother   . Diabetes Sister   . Stroke Sister   . Dementia Sister   . Diabetes Brother   . Dementia Brother   . Hypertension Neg Hx     Physical Exam: Vitals:   08/31/2018 1738 08/27/2018 1739 08/26/2018 1830 09/19/2018 1900  BP:  (!) 97/53 117/70 112/70  Pulse: (!) 104 (!) 101 90 90  Resp: 15 18 19 18   Temp: 97.8 F (36.6 C)     TempSrc: Oral     SpO2: 100% 99% 100% 97%  Weight:      Height:       General: Elderly cachectic white man, chronically ill-appearing. ENT: Mucous membranes dry, bilateral scleral injection present, with left eye inferior portion with white film/exudate, but pupils bilaterally round  Cardiovascular: RRR. No M/R/G. No LE edema.  Respiratory: CTA bilaterally. No wheezes or crackles anteriorly. Normal respiratory effort.  Breathing room air Abdomen: Soft, non-tender.  Well-healed midline scar present. Skin: Multiple ecchymoses of varying ages in addition to healing wounds on arms and legs likely related to falls of different time periods. Musculoskeletal: Moves all extremities, globally decreased muscle mass Psychiatric: Mildly  agitated Neurologic: Is verbal, but incoherent, does not answer questions, is alert (family reports baseline)  I have personally reviewed the following labs, culture data, and imaging studies.  Assessment/Plan:  #Hypernatremia Course: preceding reports of possible decreased PO intake at facility, upon initial lab work, Na of 154; clinically appears dry with dry mucus membranes; BUN/CR of 42 /1.2. A/P: appears dehydrated clinically; agree with 1 L NS bolus, will then place on a rate of 100 cc q hr x 10 hrs then re-assess based on response, may need closer encouragement in the outpatient setting to continue PO intake  #Other problems: -Lactic acidosis: nadir SBP of 97, lactic acid peak of 3.07, is trending down, will re-check to ensure  resolution (of note, lactic acid has been elevated in the past for similar presentations -Leukocytosis: suspect reactive, trend -Insulin dep DM with hypoglycemia PTA:  on 70/30 insulin prior to admission; A1c of 8.1%; suspect that hypoglycemia reported at facility to the 20s is partially related to dehydration and prolonged insulin effects; will hold long acting insulin for now and institute a meal time coverage of 3 units with meals and rapid acting corrective factor; plan to re-initiate basal insulin when BS begins to rise / clinical stability -Prolonged QTc: in past QTc > 500 msec; avoid additional QTc prolonging medications, check EKG -Increased CV risk / hx of CAD: continue home statin, ASA, BB; hold ACEi until euvolemic -BPH: hold alpha blocker until lactic acid normalizes -Dementia with behavioral disturbance: at his baseline per family report, requires ADL assistance, verbal but incoherent, requires intermittent benzodiazepine use for agitation per family report; will provide lorazepam 0.5 mg IV as needed for agitation; continue home dementia medications -Macrocytosis: on B12 replacement; check B12 to ensure adequate replacement  DVT prophylaxis: Subq  Lovenox Code Status: DNR/ DNI based on discussion at admission Disposition Plan: Anticipate D/C back to SNF when medically optimized Consults called: none Admission status: admit to hospital medicine   Cheri Rous, MD [facility] Page:[phone]  If 7PM-7AM, please contact night-coverage [email or web address] Password [email or web address]  This document was created using the aid of voice recognition / dication software.

## 2018-09-05 NOTE — ED Notes (Signed)
ED Provider at bedside. 

## 2018-09-05 NOTE — ED Notes (Signed)
CBG 130 

## 2018-09-05 NOTE — ED Notes (Signed)
Per lab, serum osmolality is hemolyzed and will need to be recollected.

## 2018-09-05 NOTE — ED Provider Notes (Signed)
[facility] EMERG[facility] DEPARTMENT Provider Note   CSN: [id number] Arrival date & time: 08/23/2018  1726     History   Chief Complaint Chief Complaint  Patient presents with  . Hypoglycemia    HPI Joseph Mora is a 84 y.o. male.  83 yo M with a chief complaint of decreased oral intake.  Going on for the past 3 to 4 days.  Patient was noted to be hypoglycemic this morning had a blood sugar in the 20s.  Family is not sure if he was given his insulin today or not.  States that he has been at his baseline prior to this.  They deny infectious complaint.  Patient is nonverbal at baseline.  Level 5 caveat nonverbal.  The history is provided by the patient.  Hypoglycemia  Associated symptoms: no shortness of breath, no tremors and no vomiting   Illness  This is a new problem. The current episode started 2 days ago. The problem occurs constantly. The problem has not changed since onset.Pertinent negatives include no chest pain, no abdominal pain, no headaches and no shortness of breath. Nothing aggravates the symptoms. Nothing relieves the symptoms. He has tried nothing for the symptoms.    Past Medical History:  Diagnosis Date  . Arthritis    RA  . CAD (coronary artery disease)   . Diabetes mellitus   . Hyperlipidemia   . Memory difficulty 06/22/2015  . Myocardial infarction ([place])   . Tremor 06/22/2015   Intentional, right hand    Patient Active Problem List   Diagnosis Date Noted  . Hypernatremia 08/30/2018  . Dehydration 02/24/2018  . Hypoglycemia 10/14/2015  . CAD in native artery   . Memory difficulty 06/22/2015  . Tremor 06/22/2015  . Premature ventricular contractions 06/22/2015  . Essential hypertension 05/23/2012  . Benign prostatic hyperplasia 04/21/2011  . Chronic back pain 04/21/2011  . Insulin dependent diabetes mellitus ([place]) 04/21/2011  . Hx of adenomatous colonic polyps 04/21/2011  . History of Helicobacter infection 04/21/2011  . CAD  (coronary artery disease) 04/03/2011  . Hyperlipidemia 04/03/2011    Past Surgical History:  Procedure Laterality Date  . BACK SURGERY    . CARPAL TUNNEL RELEASE Bilateral   . CATARACT EXTRACTION Bilateral   . INGUINAL HERNIA REPAIR    . PARATHYROIDECTOMY          Home Medications    Prior to Admission medications   Medication Sig Start Date End Date Taking? Authorizing Provider  aspirin (ASPIRIN 81) 81 MG EC tablet Take 81 mg by mouth daily. Swallow whole.    [provider]  atorvastatin (LIPITOR) 40 MG tablet TAKE 1 TABLET BY MOUTH DAILY Patient taking differently: Take 40 mg by mouth daily.  09/11/17   Elby Showers, MD  buPROPion (WELLBUTRIN XL) 150 MG 24 hr tablet TAKE 1 TABLET(150 MG) BY MOUTH DAILY Patient taking differently: Take 150 mg by mouth daily.  06/01/18   Elby Showers, MD  donepezil (ARICEPT) 10 MG tablet TAKE 1 TABLET(10 MG) BY MOUTH AT BEDTIME Patient taking differently: Take 10 mg by mouth at bedtime.  11/19/17   Ward Givens, NP  insulin lispro (HUMALOG) 100 UNIT/ML injection Inject into the skin 3 (three) times daily before meals. Per sliding scale    [provider]  JANUVIA 25 MG tablet Take 50 mg by mouth daily. 07/26/18   [provider]  LEVEMIR 100 UNIT/ML injection Inject 20-40 Units into the skin See admin instructions. Inject 40  units every morning and 20 units at bedtime. 08/07/18   [provider]  Melatonin 3 MG TABS Take 3 mg by mouth at bedtime.    [provider]  memantine (NAMENDA) 10 MG tablet TAKE 1 TABLET(10 MG) BY MOUTH TWICE DAILY Patient taking differently: Take 10 mg by mouth 2 (two) times daily.  07/01/18   Elby Showers, MD  metoprolol tartrate (LOPRESSOR) 50 MG tablet TAKE 1 TABLET(50 MG) BY MOUTH TWICE DAILY Patient taking differently: Take 50 mg by mouth 2 (two) times daily.  07/01/18   Elby Showers, MD  mirabegron ER (MYRBETRIQ) 50 MG TB24 tablet Take 50 mg by mouth daily.     [provider]  nitroGLYCERIN (NITROSTAT) 0.4 MG SL tablet Place 1 tablet (0.4 mg total) under the tongue every 5 (five) minutes as needed for chest pain. 07/31/16   Elby Showers, MD  Nutritional Supplements (RESOURCE 2.0) LIQD Take 60 mLs by mouth 2 (two) times daily.    [provider]  risperiDONE (RISPERDAL) 0.25 MG tablet Take 0.25 mg by mouth 2 (two) times daily. 08/04/18   [provider]  sertraline (ZOLOFT) 50 MG tablet TAKE 1 TABLET(50 MG) BY MOUTH DAILY Patient taking differently: Take 50 mg by mouth daily.  07/01/18   Elby Showers, MD  tamsulosin (FLOMAX) 0.4 MG CAPS capsule TAKE 1 CAPSULE BY MOUTH EVERY DAY Patient taking differently: Take 0.4 mg by mouth daily.  03/28/18   Elby Showers, MD  vitamin B-12 100 MCG tablet Take 1 tablet (100 mcg total) by mouth daily. 06/01/15   Florencia Reasons, MD    Family History Family History  Problem Relation Age of Onset  . Heart attack Paternal Aunt   . Stroke Mother   . Diabetes Mother   . Dementia Mother   . Diabetes Sister   . Stroke Sister   . Dementia Sister   . Diabetes Brother   . Dementia Brother   . Hypertension Neg Hx     Social History Social History   Tobacco Use  . Smoking status: Former Smoker    Last attempt to quit: 08/03/2010    Years since quitting: 8.0  . Smokeless tobacco: Current User    Types: Chew  Substance Use Topics  . Alcohol use: No  . Drug use: No     Allergies   Morphine and related and Penicillins   Review of Systems Review of Systems  Unable to perform ROS: Patient nonverbal  Constitutional: Negative for chills and fever.  HENT: Negative for congestion and facial swelling.   Eyes: Negative for discharge and visual disturbance.  Respiratory: Negative for shortness of breath.   Cardiovascular: Negative for chest pain and palpitations.  Gastrointestinal: Negative for abdominal pain, diarrhea and vomiting.  Musculoskeletal: Negative for arthralgias and myalgias.    Skin: Negative for color change and rash.  Neurological: Negative for tremors, syncope and headaches.  Psychiatric/Behavioral: Negative for confusion and dysphoric mood.     Physical Exam Updated Vital Signs BP (!) 146/77   Pulse (!) 110   Temp 97.8 F (36.6 C) (Oral)   Resp 18   Ht 6' (1.829 m)   Wt 54.4 kg   SpO2 100%   BMI 16.27 kg/m   Physical Exam Vitals signs and nursing note reviewed.  Constitutional:      Appearance: He is well-developed.     Comments: Cachectic  HENT:     Head: Normocephalic and atraumatic.  Eyes:  Pupils: Pupils are equal, round, and reactive to light.  Neck:     Musculoskeletal: Normal range of motion and neck supple.     Vascular: No JVD.  Cardiovascular:     Rate and Rhythm: Normal rate and regular rhythm.     Heart sounds: No murmur. No friction rub. No gallop.   Pulmonary:     Effort: No respiratory distress.     Breath sounds: No wheezing.  Abdominal:     General: There is no distension.     Tenderness: There is no guarding or rebound.  Musculoskeletal: Normal range of motion.  Skin:    Coloration: Skin is not pale.     Findings: No rash.  Neurological:     Mental Status: He is alert.  Psychiatric:        Behavior: Behavior normal.      ED Treatments / Results  Labs (all labs ordered are listed, but only abnormal results are displayed) Labs Reviewed  COMPREHENSIVE METABOLIC PANEL - Abnormal; Notable for the following components:      Result Value   Sodium 153 (*)    Potassium 3.4 (*)    Chloride 112 (*)    Glucose, Bld 218 (*)    BUN 41 (*)    Creatinine, Ser 1.29 (*)    Calcium 8.5 (*)    Total Protein 6.3 (*)    Albumin 2.8 (*)    GFR calc non Af Amer 51 (*)    GFR calc Af Amer 59 (*)    Anion gap 16 (*)    All other components within normal limits  CBC WITH DIFFERENTIAL/PLATELET - Abnormal; Notable for the following components:   WBC 16.2 (*)    Hemoglobin 17.4 (*)    HCT 56.5 (*)    MCV 101.6 (*)     Platelets 121 (*)    Neutro Abs 13.7 (*)    Monocytes Absolute 1.6 (*)    Abs Immature Granulocytes 0.09 (*)    All other components within normal limits  URINALYSIS, ROUTINE W REFLEX MICROSCOPIC - Abnormal; Notable for the following components:   Glucose, UA 50 (*)    All other components within normal limits  BASIC METABOLIC PANEL - Abnormal; Notable for the following components:   Sodium 150 (*)    Chloride 113 (*)    Glucose, Bld 105 (*)    BUN 42 (*)    Calcium 8.2 (*)    GFR calc non Af Amer 56 (*)    All other components within normal limits  I-STAT CHEM 8, ED - Abnormal; Notable for the following components:   Sodium 154 (*)    Potassium 3.2 (*)    Chloride 114 (*)    BUN 40 (*)    Glucose, Bld 209 (*)    Calcium, Ion 1.08 (*)    Hemoglobin 18.0 (*)    HCT 53.0 (*)    All other components within normal limits  I-STAT CG4 LACTIC ACID, ED - Abnormal; Notable for the following components:   Lactic Acid, Venous 3.07 (*)    All other components within normal limits  I-STAT CHEM 8, ED - Abnormal; Notable for the following components:   Sodium 151 (*)    Chloride 113 (*)    BUN 41 (*)    Glucose, Bld 104 (*)    Calcium, Ion 0.99 (*)    All other components within normal limits  I-STAT CG4 LACTIC ACID, ED - Abnormal; Notable for the following  components:   Lactic Acid, Venous 2.26 (*)    All other components within normal limits  URINE CULTURE  TSH  SODIUM, URINE, RANDOM  T4  BASIC METABOLIC PANEL  CBC  LACTIC ACID, PLASMA  VITAMIN B12  HEMOGLOBIN A1C  CBG MONITORING, ED  I-STAT TROPONIN, ED  CBG MONITORING, ED  I-STAT CG4 LACTIC ACID, ED    EKG None  Radiology Dg Chest 2 View  Result Date: 08/28/2018 CLINICAL DATA:  Altered mental status EXAM: CHEST - 2 VIEW COMPARISON:  None. FINDINGS: The heart size and mediastinal contours are within normal limits. Both lungs are clear. The visualized skeletal structures are unremarkable. Coronary artery stent noted.  IMPRESSION: No active cardiopulmonary disease. Electronically Signed   By: Ulyses Jarred M.D.   On: 09/10/2018 18:32   Ct Head Wo Contrast  Result Date: 09/01/2018 CLINICAL DATA:  Altered mental status EXAM: CT HEAD WITHOUT CONTRAST TECHNIQUE: Contiguous axial images were obtained from the base of the skull through the vertex without intravenous contrast. COMPARISON:  Head CT 08/23/2018 FINDINGS: Brain: There is no mass, hemorrhage or extra-axial collection. There is generalized atrophy without lobar predilection. Old left cerebellar infarct. There is periventricular hypoattenuation compatible with chronic microvascular disease. Vascular: Atherosclerotic calcification of the internal carotid arteries at the skull base. No abnormal hyperdensity of the major intracranial arteries or dural venous sinuses. Skull: The visualized skull base, calvarium and extracranial soft tissues are normal. Sinuses/Orbits: No fluid levels or advanced mucosal thickening of the visualized paranasal sinuses. No mastoid or middle ear effusion. The orbits are normal. IMPRESSION: 1. No acute intracranial abnormality. 2. 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 Electronically Signed   By: Ulyses Jarred M.D.   On: 09/19/2018 18:43    Procedures Procedures (including critical care time)  Medications Ordered in ED Medications  aspirin EC tablet 81 mg (has no administration in time range)  atorvastatin (LIPITOR) tablet 40 mg (has no administration in time range)  metoprolol tartrate (LOPRESSOR) tablet 50 mg (has no administration in time range)  donepezil (ARICEPT) tablet 10 mg (has no administration in time range)  memantine (NAMENDA) tablet 10 mg (has no administration in time range)  sertraline (ZOLOFT) tablet 50 mg (has no administration in time range)  vitamin B-12 (CYANOCOBALAMIN) tablet 100 mcg (has no administration in time range)  enoxaparin (LOVENOX) injection 40 mg (has no administration in  time range)  sodium chloride flush (NS) 0.9 % injection 3 mL (3 mLs Intravenous Given 09/19/2018 2241)  0.9 %  sodium chloride infusion (has no administration in time range)  insulin aspart (novoLOG) injection 3 Units (has no administration in time range)  insulin aspart (novoLOG) injection 0-9 Units (has no administration in time range)  LORazepam (ATIVAN) injection 0.5 mg (has no administration in time range)  sodium chloride 0.9 % bolus 1,000 mL (1,000 mLs Intravenous New Bag/Given 08/26/2018 2216)     Initial Impression / Assessment and Plan / ED Course  I have reviewed the triage vital signs and the nursing notes.  Pertinent labs & imaging results that were available during my care of the patient were reviewed by me and considered in my medical decision making (see chart for details).     84 yo M with a chief complaint of hypoglycemia.  Patient has not been eating and drinking well for the past couple days.  Family is unsure if he is gotten his insulin this morning or not.  Patient's blood sugar on arrival here is in the  200s, patient is at baseline per the family.  We will obtain a laboratory work-up as the patient is nonverbal to evaluate for other possible causes of hypoglycemia.  His lactic acid is elevated to 3.  He appears to be volume only contracted based on his i-STAT Chem-8 though I question if this is correct because the sodium was equivalent to that of normal saline.  The patient had a lactic acidosis that significantly cleared with 1 bag of IV fluids.  Patient continues to be at his mental baseline.  I rechecked the sodium which unfortunately seems to be continually elevated.  Not drawn from the line per nursing.  Will obtain an osmolality as well as urine sodium.  Will discuss with hospitalist.  CRITICAL CARE Performed by: Cecilio Asper   Total critical care time: 35 minutes  Critical care time was exclusive of separately billable procedures and treating other  patients.  Critical care was necessary to treat or prevent imminent or life-threatening deterioration.  Critical care was time spent personally by me on the following activities: development of treatment plan with patient and/or surrogate as well as nursing, discussions with consultants, evaluation of patient's response to treatment, examination of patient, obtaining history from patient or surrogate, ordering and performing treatments and interventions, ordering and review of laboratory studies, ordering and review of radiographic studies, pulse oximetry and re-evaluation of patient's condition.  The patients results and plan were reviewed and discussed.   Any x-rays performed were independently reviewed by myself.   Differential diagnosis were considered with the presenting HPI.  Medications  aspirin EC tablet 81 mg (has no administration in time range)  atorvastatin (LIPITOR) tablet 40 mg (has no administration in time range)  metoprolol tartrate (LOPRESSOR) tablet 50 mg (has no administration in time range)  donepezil (ARICEPT) tablet 10 mg (has no administration in time range)  memantine (NAMENDA) tablet 10 mg (has no administration in time range)  sertraline (ZOLOFT) tablet 50 mg (has no administration in time range)  vitamin B-12 (CYANOCOBALAMIN) tablet 100 mcg (has no administration in time range)  enoxaparin (LOVENOX) injection 40 mg (has no administration in time range)  sodium chloride flush (NS) 0.9 % injection 3 mL (3 mLs Intravenous Given 09/04/2018 2241)  0.9 %  sodium chloride infusion (has no administration in time range)  insulin aspart (novoLOG) injection 3 Units (has no administration in time range)  insulin aspart (novoLOG) injection 0-9 Units (has no administration in time range)  LORazepam (ATIVAN) injection 0.5 mg (has no administration in time range)  sodium chloride 0.9 % bolus 1,000 mL (1,000 mLs Intravenous New Bag/Given 09/13/2018 2216)    Vitals:   09/04/2018 1739  09/04/2018 1830 09/21/2018 1900 09/08/2018 2255  BP: (!) 97/53 117/70 112/70 (!) 146/77  Pulse: (!) 101 90 90 (!) 110  Resp: 18 19 18 18   Temp:      TempSrc:      SpO2: 99% 100% 97% 100%  Weight:      Height:        Final diagnoses:  Hypoglycemia  Hypernatremia  Dehydration    Admission/ observation were discussed with the admitting physician, patient and/or family and they are comfortable with the plan.    Final Clinical Impressions(s) / ED Diagnoses   Final diagnoses:  Hypoglycemia  Hypernatremia  Dehydration    ED Discharge Orders    None       Deno Etienne, DO 08/31/2018 2324

## 2018-09-05 NOTE — ED Notes (Signed)
Pharmacy messaged about unverified meds 

## 2018-09-05 NOTE — ED Notes (Signed)
CBG 144 at [employer]

## 2018-09-05 NOTE — ED Notes (Signed)
Admitting, MD at bedside.  

## 2018-09-06 DIAGNOSIS — G9341 Metabolic encephalopathy: Secondary | ICD-10-CM | POA: Diagnosis present

## 2018-09-06 DIAGNOSIS — E785 Hyperlipidemia, unspecified: Secondary | ICD-10-CM | POA: Diagnosis present

## 2018-09-06 DIAGNOSIS — M069 Rheumatoid arthritis, unspecified: Secondary | ICD-10-CM | POA: Diagnosis present

## 2018-09-06 DIAGNOSIS — N400 Enlarged prostate without lower urinary tract symptoms: Secondary | ICD-10-CM | POA: Diagnosis present

## 2018-09-06 DIAGNOSIS — E860 Dehydration: Secondary | ICD-10-CM | POA: Diagnosis present

## 2018-09-06 DIAGNOSIS — Z66 Do not resuscitate: Secondary | ICD-10-CM | POA: Diagnosis present

## 2018-09-06 DIAGNOSIS — E872 Acidosis: Secondary | ICD-10-CM | POA: Diagnosis present

## 2018-09-06 DIAGNOSIS — R251 Tremor, unspecified: Secondary | ICD-10-CM | POA: Diagnosis present

## 2018-09-06 DIAGNOSIS — Z9181 History of falling: Secondary | ICD-10-CM | POA: Diagnosis not present

## 2018-09-06 DIAGNOSIS — Z993 Dependence on wheelchair: Secondary | ICD-10-CM | POA: Diagnosis not present

## 2018-09-06 DIAGNOSIS — Z8711 Personal history of peptic ulcer disease: Secondary | ICD-10-CM | POA: Diagnosis not present

## 2018-09-06 DIAGNOSIS — Z7189 Other specified counseling: Secondary | ICD-10-CM | POA: Diagnosis not present

## 2018-09-06 DIAGNOSIS — Z681 Body mass index (BMI) 19 or less, adult: Secondary | ICD-10-CM | POA: Diagnosis not present

## 2018-09-06 DIAGNOSIS — I2510 Atherosclerotic heart disease of native coronary artery without angina pectoris: Secondary | ICD-10-CM | POA: Diagnosis present

## 2018-09-06 DIAGNOSIS — Z515 Encounter for palliative care: Secondary | ICD-10-CM | POA: Diagnosis present

## 2018-09-06 DIAGNOSIS — R64 Cachexia: Secondary | ICD-10-CM | POA: Diagnosis present

## 2018-09-06 DIAGNOSIS — Z87891 Personal history of nicotine dependence: Secondary | ICD-10-CM | POA: Diagnosis not present

## 2018-09-06 DIAGNOSIS — D72829 Elevated white blood cell count, unspecified: Secondary | ICD-10-CM | POA: Diagnosis present

## 2018-09-06 DIAGNOSIS — E870 Hyperosmolality and hypernatremia: Secondary | ICD-10-CM | POA: Diagnosis present

## 2018-09-06 DIAGNOSIS — N179 Acute kidney failure, unspecified: Secondary | ICD-10-CM | POA: Diagnosis present

## 2018-09-06 DIAGNOSIS — E11649 Type 2 diabetes mellitus with hypoglycemia without coma: Secondary | ICD-10-CM | POA: Diagnosis present

## 2018-09-06 DIAGNOSIS — R410 Disorientation, unspecified: Secondary | ICD-10-CM | POA: Diagnosis present

## 2018-09-06 DIAGNOSIS — Z7401 Bed confinement status: Secondary | ICD-10-CM | POA: Diagnosis not present

## 2018-09-06 DIAGNOSIS — I10 Essential (primary) hypertension: Secondary | ICD-10-CM | POA: Diagnosis present

## 2018-09-06 DIAGNOSIS — F0391 Unspecified dementia with behavioral disturbance: Secondary | ICD-10-CM | POA: Diagnosis present

## 2018-09-06 DIAGNOSIS — I252 Old myocardial infarction: Secondary | ICD-10-CM | POA: Diagnosis not present

## 2018-09-06 LAB — CBC
HCT: 51.4 % (ref 39.0–52.0)
Hemoglobin: 16 g/dL (ref 13.0–17.0)
MCH: 31.3 pg (ref 26.0–34.0)
MCHC: 31.1 g/dL (ref 30.0–36.0)
MCV: 100.6 fL — ABNORMAL HIGH (ref 80.0–100.0)
Platelets: 122 10*3/uL — ABNORMAL LOW (ref 150–400)
RBC: 5.11 MIL/uL (ref 4.22–5.81)
RDW: 14 % (ref 11.5–15.5)
WBC: 13.1 10*3/uL — ABNORMAL HIGH (ref 4.0–10.5)
nRBC: 0 % (ref 0.0–0.2)

## 2018-09-06 LAB — GLUCOSE, CAPILLARY
GLUCOSE-CAPILLARY: 189 mg/dL — AB (ref 70–99)
Glucose-Capillary: 184 mg/dL — ABNORMAL HIGH (ref 70–99)
Glucose-Capillary: 144 mg/dL — ABNORMAL HIGH (ref 70–99)
Glucose-Capillary: 88 mg/dL (ref 70–99)

## 2018-09-06 LAB — CBG MONITORING, ED
GLUCOSE-CAPILLARY: 109 mg/dL — AB (ref 70–99)
Glucose-Capillary: 157 mg/dL — ABNORMAL HIGH (ref 70–99)
Glucose-Capillary: <10 mg/dL — CL (ref 70–99)
Glucose-Capillary: 65 mg/dL — ABNORMAL LOW (ref 70–99)
Glucose-Capillary: 137 mg/dL — ABNORMAL HIGH (ref 70–99)
Glucose-Capillary: 96 mg/dL (ref 70–99)
Glucose-Capillary: 84 mg/dL (ref 70–99)
Glucose-Capillary: 130 mg/dL — ABNORMAL HIGH (ref 70–99)
Glucose-Capillary: 67 mg/dL — ABNORMAL LOW (ref 70–99)
Glucose-Capillary: 189 mg/dL — ABNORMAL HIGH (ref 70–99)
Glucose-Capillary: 203 mg/dL — ABNORMAL HIGH (ref 70–99)
Glucose-Capillary: 166 mg/dL — ABNORMAL HIGH (ref 70–99)

## 2018-09-06 LAB — LACTIC ACID, PLASMA: LACTIC ACID, VENOUS: 1 mmol/L (ref 0.5–1.9)

## 2018-09-06 LAB — T4: T4, Total: 9.8 ug/dL (ref 4.5–12.0)

## 2018-09-06 LAB — BASIC METABOLIC PANEL
ANION GAP: 12 (ref 5–15)
BUN: 36 mg/dL — ABNORMAL HIGH (ref 8–23)
CO2: 29 mmol/L (ref 22–32)
Calcium: 8.2 mg/dL — ABNORMAL LOW (ref 8.9–10.3)
Chloride: 115 mmol/L — ABNORMAL HIGH (ref 98–111)
Creatinine, Ser: 1.3 mg/dL — ABNORMAL HIGH (ref 0.61–1.24)
GFR calc Af Amer: 58 mL/min — ABNORMAL LOW (ref >60)
GFR calc non Af Amer: 50 mL/min — ABNORMAL LOW (ref >60)
Glucose, Bld: <20 mg/dL — CL (ref 70–99)
POTASSIUM: 4 mmol/L (ref 3.5–5.1)
Sodium: 156 mmol/L — ABNORMAL HIGH (ref 135–145)

## 2018-09-06 LAB — OSMOLALITY: Osmolality: 324 mOsm/kg (ref 275–295)

## 2018-09-06 LAB — VITAMIN B12: Vitamin B-12: 1439 pg/mL — ABNORMAL HIGH (ref 180–914)

## 2018-09-06 LAB — URINE CULTURE: Culture: NO GROWTH

## 2018-09-06 LAB — MAGNESIUM: Magnesium: 2.4 mg/dL (ref 1.7–2.4)

## 2018-09-06 MED ORDER — INSULIN ASPART 100 UNIT/ML ~~LOC~~ SOLN
0.0000 [IU] | Freq: Four times a day (QID) | SUBCUTANEOUS | Status: DC
  Administered 2018-09-07: 2 [IU] via SUBCUTANEOUS
  Administered 2018-09-07: 5 [IU] via SUBCUTANEOUS
  Administered 2018-09-07: 2 [IU] via SUBCUTANEOUS
  Administered 2018-09-07: 1 [IU] via SUBCUTANEOUS
  Administered 2018-09-08 (×3): 2 [IU] via SUBCUTANEOUS

## 2018-09-06 MED ORDER — DEXTROSE 50 % IV SOLN
INTRAVENOUS | Status: AC
  Administered 2018-09-06: 03:00:00
  Filled 2018-09-06: qty 50

## 2018-09-06 MED ORDER — GLUCAGON HCL RDNA (DIAGNOSTIC) 1 MG IJ SOLR
1.0000 mg | Freq: Once | INTRAMUSCULAR | Status: AC
  Administered 2018-09-06: 1 mg via INTRAVENOUS
  Filled 2018-09-06: qty 1

## 2018-09-06 MED ORDER — DEXTROSE 5 % IV BOLUS
500.0000 mL | Freq: Once | INTRAVENOUS | Status: AC
  Administered 2018-09-06: 500 mL via INTRAVENOUS

## 2018-09-06 MED ORDER — HYDRALAZINE HCL 20 MG/ML IJ SOLN: 10.0000 mg | Freq: Four times a day (QID) | INTRAMUSCULAR | Status: DC | PRN

## 2018-09-06 MED ORDER — HALOPERIDOL LACTATE 5 MG/ML IJ SOLN
2.0000 mg | Freq: Four times a day (QID) | INTRAMUSCULAR | Status: DC | PRN
  Administered 2018-09-06 (×2): 2 mg via INTRAVENOUS
  Filled 2018-09-06 (×2): qty 1

## 2018-09-06 MED ORDER — DEXTROSE 50 % IV SOLN
INTRAVENOUS | Status: AC
  Administered 2018-09-06: 12.5 g
  Filled 2018-09-06: qty 50

## 2018-09-06 MED ORDER — DEXTROSE 5 % IV SOLN
INTRAVENOUS | Status: DC
  Administered 2018-09-06 – 2018-09-07 (×3): via INTRAVENOUS

## 2018-09-06 MED ORDER — DEXTROSE 50 % IV SOLN
INTRAVENOUS | Status: AC
  Administered 2018-09-06: 50 mL
  Filled 2018-09-06: qty 50

## 2018-09-06 NOTE — ED Notes (Signed)
CBG 84. 

## 2018-09-06 NOTE — ED Notes (Signed)
NT checked CBG read LOW.   MD made aware.

## 2018-09-06 NOTE — ED Notes (Signed)
CBG 96. 

## 2018-09-06 NOTE — ED Notes (Signed)
RN gave 2mg  haldol pt still agitated and swing arms and legs trying to get out of bed.

## 2018-09-06 NOTE — ED Notes (Addendum)
Meds sent from Pharmacy but unable to give PO meds due to patient condition at this time.   Patient not following commands, risk for aspiration.

## 2018-09-06 NOTE — ED Notes (Addendum)
CBG 67 -- Given 12.5 mg D50 per Hypoglycemia protocol.   Notified PA Bodenheimer.

## 2018-09-06 NOTE — Progress Notes (Signed)
Pt's wife, [name], was called and notified of what room pt is in.  Pt is attempting to climb over rails and is swinging mitted hands at staff and just swinging them around. Pt is putting at lines and anything he can reach. Order placed for safety sitter.   Pt is nonverbal, eyes closed and he is not responding to commands.

## 2018-09-06 NOTE — ED Notes (Signed)
MD at bedside. Verbal order for 1 mg IV Glucagon.

## 2018-09-06 NOTE — ED Provider Notes (Signed)
Patient awaiting inpatient bed assignment for hypoglycemia.  Called to patient's room for CBG check reading <10 and unresponsiveness.  He was given amp of D50 and slowly starting to arouse.  After about 5 mins, CBG re-checked and up to 157.  Will monitor closely.  CBG re-check in about 10-15 mins and then Q1hour.  3:07 AM Initial repeat CBG 109, however subsequent CBG down to 65 again about 1 hour after first amp of D50.  Given another amp of D50 now, but at this point patient will require d5 drip for persistent hypoglycemia which I have ordered.  Inpatient service has been notified.  CRITICAL CARE Performed by: Larene Pickett   Total critical care time: 35 minutes  Critical care time was exclusive of separately billable procedures and treating other patients.  Critical care was necessary to treat or prevent imminent or life-threatening deterioration.  Critical care was time spent personally by me on the following activities: development of treatment plan with patient and/or surrogate as well as nursing, discussions with consultants, evaluation of patient's response to treatment, examination of patient, obtaining history from patient or surrogate, ordering and performing treatments and interventions, ordering and review of laboratory studies, ordering and review of radiographic studies, pulse oximetry and re-evaluation of patient's condition.    Larene Pickett, PA-C 09/06/18 Sugden, Ankit, MD 09/10/18 [phone]

## 2018-09-06 NOTE — Plan of Care (Signed)
Pt has advanced dementia, nonverbal. Unable to discuss care plan with pt.  Wife was called by Camera operator and notified of pt's location and situation.

## 2018-09-06 NOTE — ED Notes (Signed)
Spoke with ED provider who instructed to give another amp of D50 for CBG 65.

## 2018-09-06 NOTE — ED Notes (Signed)
Reject x1, will have other phleb recollect.

## 2018-09-06 NOTE — ED Notes (Addendum)
CBG 137

## 2018-09-06 NOTE — ED Notes (Signed)
Breakfast tray ordered 

## 2018-09-06 NOTE — Progress Notes (Signed)
PROGRESS NOTE                                                                                                                                                                                                             Patient Demographics:    Joseph Mora, is a 84 y.o. male, DOB - [birth year], JEH:[id number]  Admit date - 08/24/2018   Admitting Physician No admitting provider for patient encounter.  Outpatient Primary MD for the patient is Baxley, Cresenciano Lick, MD  LOS - 0  Chief Complaint  Patient presents with  . Hypoglycemia       Brief Narrative  This is an 84 year old male with medical problems including dementia requiring total care residing at a memory care unit, insulin-dependent diabetes, increased cardiovascular risk, and BPH presenting with hypoglycemia and increased confusion, history was provided by patient's spouse.  Upon arrival patient was found to be severely dehydrated with ARF, had severe hypernatremia and hypoglycemia which was persistent and he had severe metabolic encephalopathy and was obtunded.    Subjective:    Joseph Mora today in the ER hospital bed obtunded.   Assessment  & Plan :     1.  Acute metabolic encephalopathy caused by combination of severe dehydration, ARF and persistent hypoglycemia.  He will be hydrated with IV fluids, stop all diabetic medications, IV glucagon, D5 drip and monitor CBGs every 6.  Repeat BMP in the morning.  2.  Hypernatremia.  Due to dehydration.  D5W.  3.  ARF.  Likely due to dehydration.  Check bladder scans to make sure he is not retaining urine.  Monitor BMP after hydration.  4.  Insulin-dependent DM type II.  Currently hypoglycemic.  Holding all diabetic medications, D5 drip and monitor.  Lab Results  Component Value Date   HGBA1C 8.1 (H) 08/24/2018   CBG (last 3)  Recent Labs    09/06/18 0642 09/06/18 0706 09/06/18 0757  GLUCAP 67* 130* 189*    5.  CAD.  No acute issues once able to take oral  medications he will resume his aspirin, statin and metoprolol.  6.  Advanced dementia.  Remains at risk for delirium.  Supportive care.  Minimize narcotics and benzodiazepines.    Family Communication  :  None  Code Status :  DNR  Disposition Plan  :  Med  Consults  :  None  Procedures  :    CT head and chest x-ray.  Both nonacute.  DVT Prophylaxis  :  Lovenox  Lab Results  Component Value Date   PLT 122 (L) 09/06/2018    Diet :  Diet Order            Diet regular Room service appropriate? Yes; Fluid consistency: Thin  Diet effective now               Inpatient Medications Scheduled Meds: . aspirin EC  81 mg Oral Daily  . atorvastatin  40 mg Oral Daily  . donepezil  10 mg Oral QHS  . enoxaparin (LOVENOX) injection  40 mg Subcutaneous Q24H  . insulin aspart  0-9 Units Subcutaneous Q6H  . metoprolol tartrate  50 mg Oral BID  . sodium chloride flush  3 mL Intravenous Q12H  . cyanocobalamin  100 mcg Oral Daily   Continuous Infusions: . dextrose    . dextrose 100 mL/hr at 09/06/18 0319   PRN Meds:.  Antibiotics  :   Anti-infectives (From admission, onward)   None          Objective:   Vitals:   09/08/2018 2255 09/06/18 0230 09/06/18 0653 09/06/18 0703  BP: (!) 146/77 (!) 144/64 113/60   Pulse: (!) 110 (!) 109 92 93  Resp: 18 (!) 23 18 17   Temp:      TempSrc:      SpO2: 100% 96% 97% 97%  Weight:      Height:        Wt Readings from Last 3 Encounters:  09/01/2018 54.4 kg  05/19/18 87 kg  03/30/18 83.9 kg    No intake or output data in the 24 hours ending 09/06/18 0831   Physical Exam  Obtunded unable to follow any commands, moves all 4 extremities to painful stimuli [place].AT,PERRAL Supple Neck,No JVD, No cervical lymphadenopathy appriciated.  Symmetrical Chest wall movement, Good air movement bilaterally, CTAB RRR,No Gallops,Rubs or new Murmurs, No Parasternal Heave +ve B.Sounds, Abd Soft, No tenderness, No organomegaly appriciated, No  rebound - guarding or rigidity. No Cyanosis, Clubbing or edema, No new Rash or bruise       Data Review:    CBC Recent Labs  Lab 09/04/2018 1801 09/12/2018 1815 09/19/2018 2110 09/06/18 0213  WBC 16.2*  --   --  13.1*  HGB 17.4* 18.0* 16.7 16.0  HCT 56.5* 53.0* 49.0 51.4  PLT 121*  --   --  122*  MCV 101.6*  --   --  100.6*  MCH 31.3  --   --  31.3  MCHC 30.8  --   --  31.1  RDW 13.9  --   --  14.0  LYMPHSABS 0.8  --   --   --   MONOABS 1.6*  --   --   --   EOSABS 0.0  --   --   --   BASOSABS 0.0  --   --   --     Chemistries  Recent Labs  Lab 09/11/2018 1801 08/30/2018 1815 09/04/2018 2057 08/29/2018 2110 09/06/18 0213  NA 153* 154* 150* 151* 156*  K 3.4* 3.2* 3.8 3.6 4.0  CL 112* 114* 113* 113* 115*  CO2 25  --  26  --  29  GLUCOSE 218* 209* 105* 104* <20*  BUN 41* 40* 42* 41* 36*  CREATININE 1.29* 1.10 1.20 1.10 1.30*  CALCIUM 8.5*  --  8.2*  --  8.2*  MG  --   --   --   --  2.4  AST 35  --   --   --   --  ALT 35  --   --   --   --   ALKPHOS 91  --   --   --   --   BILITOT 0.9  --   --   --   --    ------------------------------------------------------------------------------------------------------------------ No results for input(s): CHOL, HDL, LDLCALC, TRIG, CHOLHDL, LDLDIRECT in the last 72 hours.  Lab Results  Component Value Date   HGBA1C 8.1 (H) 09/15/2018   ------------------------------------------------------------------------------------------------------------------ Recent Labs    08/24/2018 1801  TSH 0.785  T4TOTAL 9.8   ------------------------------------------------------------------------------------------------------------------ Recent Labs    09/06/18 0213  VITAMINB12 1,439*    Coagulation profile No results for input(s): INR, PROTIME in the last 168 hours.  No results for input(s): DDIMER in the last 72 hours.  Cardiac Enzymes No results for input(s): CKMB, TROPONINI, MYOGLOBIN in the last 168 hours.  Invalid input(s):  CK ------------------------------------------------------------------------------------------------------------------ No results found for: BNP  Micro Results No results found for this or any previous visit (from the past 240 hour(s)).  Radiology Reports Dg Chest 2 View  Result Date: 08/26/2018 CLINICAL DATA:  Altered mental status EXAM: CHEST - 2 VIEW COMPARISON:  None. FINDINGS: The heart size and mediastinal contours are within normal limits. Both lungs are clear. The visualized skeletal structures are unremarkable. Coronary artery stent noted. IMPRESSION: No active cardiopulmonary disease. Electronically Signed   By: Ulyses Jarred M.D.   On: 09/01/2018 18:32   Ct Head Wo Contrast  Result Date: 09/20/2018 CLINICAL DATA:  Altered mental status EXAM: CT HEAD WITHOUT CONTRAST TECHNIQUE: Contiguous axial images were obtained from the base of the skull through the vertex without intravenous contrast. COMPARISON:  Head CT 08/23/2018 FINDINGS: Brain: There is no mass, hemorrhage or extra-axial collection. There is generalized atrophy without lobar predilection. Old left cerebellar infarct. There is periventricular hypoattenuation compatible with chronic microvascular disease. Vascular: Atherosclerotic calcification of the internal carotid arteries at the skull base. No abnormal hyperdensity of the major intracranial arteries or dural venous sinuses. Skull: The visualized skull base, calvarium and extracranial soft tissues are normal. Sinuses/Orbits: No fluid levels or advanced mucosal thickening of the visualized paranasal sinuses. No mastoid or middle ear effusion. The orbits are normal. IMPRESSION: 1. No acute intracranial abnormality. 2. 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 Electronically Signed   By: Ulyses Jarred M.D.   On: 08/22/2018 18:43   Ct Head Wo Contrast  Result Date: 08/23/2018 CLINICAL DATA:  Multiple falls this past weekend.  Dementia. EXAM: CT HEAD  WITHOUT CONTRAST TECHNIQUE: Contiguous axial images were obtained from the base of the skull through the vertex without intravenous contrast. COMPARISON:  08/11/2018 FINDINGS: Brain: Ventricles and cisterns are within normal. There is mild age related atrophic change. Mild chronic ischemic microvascular disease. No mass, mass effect, shift of midline structures or acute hemorrhage. No evidence of acute infarction. Subcentimeter hypodensity over the inferior left cerebellar hemisphere likely prominent CSF space versus old lacunar infarct. Vascular: No hyperdense vessel or unexpected calcification. Skull: Normal. Negative for fracture or focal lesion. Sinuses/Orbits: No acute finding. Other: None. IMPRESSION: No acute findings. Mild atrophy and chronic ischemic microvascular disease. Stable possible subcentimeter CSF space versus old lacunar infarct left cerebellar hemisphere. Electronically Signed   By: Marin Olp M.D.   On: 08/23/2018 15:12   Ct Head Wo Contrast  Result Date: 08/11/2018 CLINICAL DATA:  Head and back pain after a fall. EXAM: CT HEAD WITHOUT CONTRAST CT CERVICAL SPINE WITHOUT CONTRAST TECHNIQUE: Multidetector CT imaging of the head and  cervical spine was performed following the standard protocol without intravenous contrast. Multiplanar CT image reconstructions of the cervical spine were also generated. COMPARISON:  CT head 03/10/2018 FINDINGS: CT HEAD FINDINGS Brain: Diffuse cerebral atrophy. Ventricular dilatation consistent with central atrophy. Low-attenuation changes in the deep white matter consistent small vessel ischemia. No mass-effect or midline shift. No abnormal extra-axial fluid collections. Gray-white matter junctions are distinct. Basal cisterns are not effaced. No acute intracranial hemorrhage. Vascular: Moderate intracranial arterial vascular calcifications. Skull: Calvarium appears intact. No acute depressed skull fractures. Sinuses/Orbits: Paranasal sinuses and mastoid air  cells are clear. Old right medial orbital wall deformity likely representing old fracture. Other: None. CT CERVICAL SPINE FINDINGS Alignment: Reversal of the usual cervical lordosis without anterior subluxation. This is likely due to patient positioning but ligamentous injury or muscle spasm could also have this appearance and are not excluded. Normal alignment of the facet joints. C1-2 articulation appears intact. Skull base and vertebrae: Skull base appears intact. No vertebral compression deformities. Focal sclerosis in the left pedicle at C3. This is nonspecific and probably represents a benign bone island although sclerotic metastasis is not entirely excluded. No other focal bone lesions identified. Soft tissues and spinal canal: No prevertebral soft tissue swelling. No abnormal paraspinal soft tissue mass or infiltration. Dystrophic calcification posterior to the spinous processes at C5 and C6. Disc levels: Degenerative changes throughout the cervical spine with narrowed interspaces and endplate hypertrophic changes. Prominent degenerative changes at C1 to. Degenerative changes in the facet joints. Upper chest: Lung apices are clear. Other: Vascular calcifications. IMPRESSION: 1. No acute intracranial abnormalities. Chronic atrophy and small vessel ischemic changes. 2. Nonspecific reversal of the usual cervical lordosis. Degenerative changes throughout the cervical spine. No acute displaced fractures identified. Sclerosis in the left pedicle at C3 likely representing benign bone island. Electronically Signed   By: Lucienne Capers M.D.   On: 08/11/2018 01:31   Ct Cervical Spine Wo Contrast  Result Date: 08/11/2018 CLINICAL DATA:  Head and back pain after a fall. EXAM: CT HEAD WITHOUT CONTRAST CT CERVICAL SPINE WITHOUT CONTRAST TECHNIQUE: Multidetector CT imaging of the head and cervical spine was performed following the standard protocol without intravenous contrast. Multiplanar CT image reconstructions  of the cervical spine were also generated. COMPARISON:  CT head 03/10/2018 FINDINGS: CT HEAD FINDINGS Brain: Diffuse cerebral atrophy. Ventricular dilatation consistent with central atrophy. Low-attenuation changes in the deep white matter consistent small vessel ischemia. No mass-effect or midline shift. No abnormal extra-axial fluid collections. Gray-white matter junctions are distinct. Basal cisterns are not effaced. No acute intracranial hemorrhage. Vascular: Moderate intracranial arterial vascular calcifications. Skull: Calvarium appears intact. No acute depressed skull fractures. Sinuses/Orbits: Paranasal sinuses and mastoid air cells are clear. Old right medial orbital wall deformity likely representing old fracture. Other: None. CT CERVICAL SPINE FINDINGS Alignment: Reversal of the usual cervical lordosis without anterior subluxation. This is likely due to patient positioning but ligamentous injury or muscle spasm could also have this appearance and are not excluded. Normal alignment of the facet joints. C1-2 articulation appears intact. Skull base and vertebrae: Skull base appears intact. No vertebral compression deformities. Focal sclerosis in the left pedicle at C3. This is nonspecific and probably represents a benign bone island although sclerotic metastasis is not entirely excluded. No other focal bone lesions identified. Soft tissues and spinal canal: No prevertebral soft tissue swelling. No abnormal paraspinal soft tissue mass or infiltration. Dystrophic calcification posterior to the spinous processes at C5 and C6. Disc levels: Degenerative changes throughout  the cervical spine with narrowed interspaces and endplate hypertrophic changes. Prominent degenerative changes at C1 to. Degenerative changes in the facet joints. Upper chest: Lung apices are clear. Other: Vascular calcifications. IMPRESSION: 1. No acute intracranial abnormalities. Chronic atrophy and small vessel ischemic changes. 2.  Nonspecific reversal of the usual cervical lordosis. Degenerative changes throughout the cervical spine. No acute displaced fractures identified. Sclerosis in the left pedicle at C3 likely representing benign bone island. Electronically Signed   By: Lucienne Capers M.D.   On: 08/11/2018 01:31    Time Spent in minutes  30   Lala Lund M.D on 09/06/2018 at 8:31 AM  To page go to [email or web address] - password [facility]

## 2018-09-06 NOTE — ED Notes (Signed)
Regular Diet was ordered for Lunch. 

## 2018-09-06 NOTE — ED Notes (Signed)
Pt very agitated trying to pull out IV and throw legs over the side of the bed. RN paged MD see new orders

## 2018-09-07 DIAGNOSIS — Z515 Encounter for palliative care: Secondary | ICD-10-CM

## 2018-09-07 DIAGNOSIS — Z7189 Other specified counseling: Secondary | ICD-10-CM

## 2018-09-07 DIAGNOSIS — E860 Dehydration: Secondary | ICD-10-CM

## 2018-09-07 LAB — BASIC METABOLIC PANEL
Anion gap: 11 (ref 5–15)
BUN: 19 mg/dL (ref 8–23)
CO2: 27 mmol/L (ref 22–32)
Calcium: 7.8 mg/dL — ABNORMAL LOW (ref 8.9–10.3)
Chloride: 106 mmol/L (ref 98–111)
Creatinine, Ser: 1.07 mg/dL (ref 0.61–1.24)
GFR calc Af Amer: >60 mL/min (ref >60)
GFR calc non Af Amer: >60 mL/min (ref >60)
GLUCOSE: 185 mg/dL — AB (ref 70–99)
Potassium: 3.6 mmol/L (ref 3.5–5.1)
Sodium: 144 mmol/L (ref 135–145)

## 2018-09-07 LAB — GLUCOSE, CAPILLARY
Glucose-Capillary: 124 mg/dL — ABNORMAL HIGH (ref 70–99)
Glucose-Capillary: 151 mg/dL — ABNORMAL HIGH (ref 70–99)
Glucose-Capillary: 173 mg/dL — ABNORMAL HIGH (ref 70–99)
Glucose-Capillary: 262 mg/dL — ABNORMAL HIGH (ref 70–99)

## 2018-09-07 LAB — MAGNESIUM: Magnesium: 2 mg/dL (ref 1.7–2.4)

## 2018-09-07 MED ORDER — LACTATED RINGERS IV SOLN
INTRAVENOUS | Status: DC
  Administered 2018-09-07 – 2018-09-08 (×2): via INTRAVENOUS

## 2018-09-07 MED ORDER — ZINC OXIDE 40 % EX OINT
TOPICAL_OINTMENT | Freq: Four times a day (QID) | CUTANEOUS | Status: DC
  Administered 2018-09-07 – 2018-09-08 (×5): via TOPICAL
  Filled 2018-09-07: qty 57

## 2018-09-07 NOTE — Consult Note (Signed)
Consultation Note Date: 09/07/2018   Patient Name: Joseph Mora  DOB: 17-May-1933  MRN: [id number]  Age / Sex: 84 y.o., male  PCP: Renold Genta Cresenciano Lick, MD Referring Physician: Thurnell Lose, MD  Reason for Consultation: Establishing goals of care  HPI/Patient Profile: 84 y.o. male  with past medical history of dementia, multiple falls, IDDM, and BPH admitted on 08/22/2018 with hypoglycemia and increased confusion. Patient resides at a memory care unit and requires total care. Patient found to be severely dehydrated. PMT consulted for [place].  Clinical Assessment and Goals of Care: I have reviewed medical records including EPIC notes, labs and imaging, assessed the patient and then met with the patient's wife to discuss diagnosis prognosis, GOC, EOL wishes, disposition and options.  I introduced Palliative Medicine as specialized medical care for people living with serious illness. It focuses on providing relief from the symptoms and stress of a serious illness. The goal is to improve quality of life for both the patient and the family.  Wife tells me of difficulties since June - patient has resided in many different facilities and has had multiple falls.  As far as functional and nutritional status, she tells me he is wheelchair or bed bound. She tells me he has been refusing all PO intake.    We discussed his current illness and what it means in the larger context of his on-going co-morbidities.  Natural disease trajectory and expectations at EOL were discussed. Discussion progression of dementia and concern patient is nearing end of life.  I attempted to elicit values and goals of care important to the patient.    The difference between aggressive medical intervention and comfort care was considered in light of the patient's goals of care.  The patient's wife does not want aggressive interventions and would like to focus on his comfort.    Advance directives, concepts specific to code status, artifical feeding and hydration, and rehospitalization were considered and discussed. Confirmed DNR, would not want life artificially prolonged.   Hospice and Palliative Care services outpatient were explained and offered. We discussed hospice care at facility vs hospice home.  Patient's wife is interested in comfort care/hospice care; she would like to include her daughter in these conversations and so she requests meeting tomorrow at 11:30 with daughter to further discuss options moving forward.   Questions and concerns were addressed.  Hard Choices booklet left for review. The family was encouraged to call with questions or concerns.   Primary Decision Maker NEXT OF KIN - wife [name]    SUMMARY OF RECOMMENDATIONS   - to meet with patient's wife and daughter tomorrow (12/18) at 11:30 to further discuss goals of care - patient's wife is interested in comfort care/hospice care - to discuss hospice at facility vs hospice home - suspect patient eligible for hospice home if truly refusing all PO intake - minimal liquid intake noted in chart, 0% solid food  Code Status/Advance Care Planning:  DNR   Symptom Management:   Haldol PRN  Palliative Prophylaxis:   Aspiration, Delirium Protocol, Frequent Pain Assessment and Oral Care  Psycho-social/Spiritual:   Desire for further Chaplaincy support:no  Additional Recommendations: Education on Hospice  Prognosis:   Unable to determine - if patient continues to refuse PO intake could be < 2 weeks  Discharge Planning: To Be Determined      Primary Diagnoses: Present on Admission: . Hypernatremia . Hyperlipidemia . Benign prostatic hyperplasia . CAD in native artery . Dehydration . Essential hypertension .  Acute metabolic encephalopathy . Hypoglycemia   I have reviewed the medical record, interviewed the patient and family, and examined the patient. The following aspects are  pertinent.  Past Medical History:  Diagnosis Date  . Arthritis    RA  . CAD (coronary artery disease)   . Diabetes mellitus   . Hyperlipidemia   . Memory difficulty 06/22/2015  . Myocardial infarction ([place])   . Tremor 06/22/2015   Intentional, right hand   Social History   Socioeconomic History  . Marital status: Married    Spouse name: [name]  . Number of children: 4  . Years of education: GED  . Highest education level: Not on file  Occupational History  . Occupation: retired  Scientific laboratory technician  . Financial resource strain: Not on file  . Food insecurity:    Worry: Not on file    Inability: Not on file  . Transportation needs:    Medical: Not on file    Non-medical: Not on file  Tobacco Use  . Smoking status: Former Smoker    Last attempt to quit: 08/03/2010    Years since quitting: 8.1  . Smokeless tobacco: Current User    Types: Chew  Substance and Sexual Activity  . Alcohol use: No  . Drug use: No  . Sexual activity: Not on file  Lifestyle  . Physical activity:    Days per week: Not on file    Minutes per session: Not on file  . Stress: Not on file  Relationships  . Social connections:    Talks on phone: Not on file    Gets together: Not on file    Attends religious service: Not on file    Active member of club or organization: Not on file    Attends meetings of clubs or organizations: Not on file    Relationship status: Not on file  Other Topics Concern  . Not on file  Social History Narrative   Patient drinks 3 cups of caffeine daily.   Patient is right handed.    Family History  Problem Relation Age of Onset  . Heart attack Paternal Aunt   . Stroke Mother   . Diabetes Mother   . Dementia Mother   . Diabetes Sister   . Stroke Sister   . Dementia Sister   . Diabetes Brother   . Dementia Brother   . Hypertension Neg Hx    Scheduled Meds: . aspirin EC  81 mg Oral Daily  . atorvastatin  40 mg Oral Daily  . donepezil  10 mg Oral QHS  . enoxaparin  (LOVENOX) injection  40 mg Subcutaneous Q24H  . insulin aspart  0-9 Units Subcutaneous Q6H  . liver oil-zinc oxide   Topical Q6H  . metoprolol tartrate  50 mg Oral BID  . sodium chloride flush  3 mL Intravenous Q12H  . cyanocobalamin  100 mcg Oral Daily   Continuous Infusions: . lactated ringers 75 mL/hr at 09/07/18 1111   PRN Meds:.haloperidol lactate, hydrALAZINE Allergies  Allergen Reactions  . Morphine And Related Itching  . Penicillins Hives    Has patient had a PCN reaction causing immediate rash, facial/tongue/throat swelling, SOB or lightheadedness with hypotension: No Has patient had a PCN reaction causing severe rash involving mucus membranes or skin necrosis: No Has patient had a PCN reaction that required hospitalization: No Has patient had a PCN reaction occurring within the last 10 years: No If all of the above answers are "NO", then may proceed  with Cephalosporin use.    Review of Systems  Unable to perform ROS: Dementia    Physical Exam Constitutional:      Appearance: He is ill-appearing.  HENT:     Head: Normocephalic and atraumatic.  Cardiovascular:     Rate and Rhythm: Normal rate and regular rhythm.  Pulmonary:     Effort: Pulmonary effort is normal. No respiratory distress.     Breath sounds: Normal breath sounds.  Abdominal:     General: Bowel sounds are normal.     Palpations: Abdomen is soft.  Musculoskeletal:     Right lower leg: No edema.     Left lower leg: No edema.  Skin:    General: Skin is warm and dry.  Neurological:     Mental Status: He is disoriented.  Psychiatric:        Behavior: Behavior is uncooperative.        Cognition and Memory: Cognition is impaired. Memory is impaired.     Vital Signs: BP 111/73 (BP Location: Right Arm)   Pulse 96   Temp 98.4 F (36.9 C) (Oral)   Resp 18   Ht 6' (1.829 m)   Wt 54.4 kg   SpO2 94%   BMI 16.27 kg/m  Pain Scale: Faces   Pain Score: Asleep   SpO2: SpO2: 94 % O2 Device:SpO2:  94 % O2 Flow Rate: .   IO: Intake/output summary:   Intake/Output Summary (Last 24 hours) at 09/07/2018 1522 Last data filed at 09/07/2018 0600 Gross per 24 hour  Intake 2200 ml  Output 100 ml  Net 2100 ml    LBM:   Baseline Weight: Weight: 54.4 kg Most recent weight: Weight: 54.4 kg     Palliative Assessment/Data: PPS 20%     Time Total: 50 minutes Greater than 50%  of this time was spent counseling and coordinating care related to the above assessment and plan.  Juel Burrow, DNP, AGNP-C Palliative Medicine Team [phone] Pager: [phone]

## 2018-09-07 NOTE — Consult Note (Signed)
[facility] Nurse wound consult note Reason for Consult: Severe intertriginous dermatitis (ITD). Patient with incontinence, poor hygiene at admission with denuded tissue in the intragluteal cleft. Becomes combative with assessment, cleansing. Patient with dementia who resides in a memory care unit. Patient has a Air cabin crew and both she and another Nursing Tech assist me in the assessment and repositioning of this patient. We simultaneously provide a bedlinen change. Wound type: Moisture associated skin damage, specifically ITD.  Wearing an external male urinary collection device (condom catheter) for management of urine. Pressure Injury POA: N/A Measurement: Affected area measures 12cm x 10cm with 0.1cm depth Wound bed: partial thickness with some return of epithelial buds/pigment Drainage (amount, consistency, odor) serous Periwound: very dry, intact Dressing procedure/placement/frequency: Patient is in a low bed for safety and is at risk for fall. Skin condition warrants a mattress replacement with low air loss feature, but the safety concerns requiring a low bed supercede the need for a mattress replacement with low air loss feature, so we will favor turning and repositioning from side to side and apply a topical agent (zinc-based Desitin) four (4) times daily. No silicone foam is to be used as it is not effectively managing moisture. There is no breakdown on the bilateral heels, but he is at risk for pressure injury in this area. For that reason, we will provide bilateral pressure redistribution heel boots for use while he is in bed.   [place] nursing team will not follow, but will remain available to this patient, the nursing and medical teams.  Please re-consult if needed. Thanks, Maudie Flakes, MSN, RN, [place], Arther Abbott  Pager# [phone]

## 2018-09-07 NOTE — Progress Notes (Signed)
PROGRESS NOTE                                                                                                                                                                                                             Patient Demographics:    Joseph Mora, is a 84 y.o. male, DOB - [birth year], DJM:[id number]  Admit date - 09/07/2018   Admitting Physician Vilma Prader, MD  Outpatient Primary MD for the patient is Baxley, Cresenciano Lick, MD  LOS - 1  Chief Complaint  Patient presents with  . Hypoglycemia       Brief Narrative  This is an 84 year old male with medical problems including dementia requiring total care residing at a memory care unit, insulin-dependent diabetes, increased cardiovascular risk, and BPH presenting with hypoglycemia and increased confusion, history was provided by patient's spouse.  Upon arrival patient was found to be severely dehydrated with ARF, had severe hypernatremia and hypoglycemia which was persistent and he had severe metabolic encephalopathy and was obtunded.    Subjective:   In bed, he was obtunded yesterday now he is awake but extremely confused.  Unable to provide any history or cooperate in exam.   Assessment  & Plan :     1.  Acute metabolic encephalopathy caused by combination of severe dehydration, ARF and persistent hypoglycemia.  He received IV D50, glucagon along with D5W with resolution of his hyponatremia and improvement in his dehydration.  Hold all long-acting insulin, transition to LR, switched from D5W to LR.  Continue CBGs every 6 hours with sliding scale only.  2.  Hypernatremia.  Dehydration resolved after D5W was switched to LR.  3.  ARF.  Due to dehydration, resolved after hydration with IV fluids.  4.  Insulin-dependent DM type II.  Currently hypoglycemic.  Holding all diabetic medications, D5 drip and monitor.  Lab Results  Component Value Date   HGBA1C 8.1 (H) 09/14/2018   CBG (last 3)  Recent Labs   09/06/18 1823 09/07/18 0004 09/07/18 0607  GLUCAP 189* 262* 151*    5.  CAD.  No acute issues once able to take oral medications he will resume his aspirin, statin and metoprolol.  6.  Advanced dementia.  Remains at risk for delirium.  Supportive care.  Minimize narcotics and benzodiazepines.     Note I think he has pretty advanced dementia and will develop dehydration and hypoglycemia again once discharged.  Will involve palliative care for goals of care.  He is DNR.  Likely would benefit from palliative care or hospice follow-up at SNF post discharge.    Family Communication  :  None  Code Status :  DNR  Disposition Plan  :  Med  Consults  : Pall. care for goals of care  Procedures  :    CT head and chest x-ray.  Both nonacute.  DVT Prophylaxis  :  Lovenox   Lab Results  Component Value Date   PLT 122 (L) 09/06/2018    Diet :  Diet Order            Diet regular Room service appropriate? Yes; Fluid consistency: Thin  Diet effective now               Inpatient Medications Scheduled Meds: . aspirin EC  81 mg Oral Daily  . atorvastatin  40 mg Oral Daily  . donepezil  10 mg Oral QHS  . enoxaparin (LOVENOX) injection  40 mg Subcutaneous Q24H  . insulin aspart  0-9 Units Subcutaneous Q6H  . liver oil-zinc oxide   Topical Q6H  . metoprolol tartrate  50 mg Oral BID  . sodium chloride flush  3 mL Intravenous Q12H  . cyanocobalamin  100 mcg Oral Daily   Continuous Infusions: . lactated ringers     PRN Meds:.  Antibiotics  :   Anti-infectives (From admission, onward)   None          Objective:   Vitals:   09/06/18 1330 09/06/18 1809 09/06/18 2127 09/07/18 0605  BP: (!) 77/58 125/71 98/73 111/73  Pulse: (!) 102 100 (!) 107 96  Resp:  18 20 18   Temp: 98.2 F (36.8 C) 98.4 F (36.9 C) (!) 97.5 F (36.4 C) 98.4 F (36.9 C)  TempSrc: Oral Axillary Oral Oral  SpO2:  100% 97% 94%  Weight:      Height:        Wt Readings from Last 3 Encounters:    08/25/2018 54.4 kg  05/19/18 87 kg  03/30/18 83.9 kg     Intake/Output Summary (Last 24 hours) at 09/07/2018 1030 Last data filed at 09/07/2018 0600 Gross per 24 hour  Intake 2203 ml  Output 100 ml  Net 2103 ml     Physical Exam  He is awake today but completely confused, moving all 4 extremities by himself, [place].AT, Supple Neck,No JVD, No cervical lymphadenopathy appriciated.  Symmetrical Chest wall movement, Good air movement bilaterally, CTAB RRR,No Gallops, Rubs or new Murmurs, No Parasternal Heave +ve B.Sounds, Abd Soft, No tenderness, No organomegaly appriciated, No rebound - guarding or rigidity. No Cyanosis, Clubbing or edema, No new Rash or bruise     Data Review:    CBC Recent Labs  Lab 09/01/2018 1801 08/29/2018 1815 08/25/2018 2110 09/06/18 0213  WBC 16.2*  --   --  13.1*  HGB 17.4* 18.0* 16.7 16.0  HCT 56.5* 53.0* 49.0 51.4  PLT 121*  --   --  122*  MCV 101.6*  --   --  100.6*  MCH 31.3  --   --  31.3  MCHC 30.8  --   --  31.1  RDW 13.9  --   --  14.0  LYMPHSABS 0.8  --   --   --   MONOABS 1.6*  --   --   --   EOSABS 0.0  --   --   --   BASOSABS 0.0  --   --   --     Chemistries  Recent  Labs  Lab 08/31/2018 1801 08/24/2018 1815 09/08/2018 2057 09/18/2018 2110 09/06/18 0213 09/07/18 0331  NA 153* 154* 150* 151* 156* 144  K 3.4* 3.2* 3.8 3.6 4.0 3.6  CL 112* 114* 113* 113* 115* 106  CO2 25  --  26  --  29 27  GLUCOSE 218* 209* 105* 104* <20* 185*  BUN 41* 40* 42* 41* 36* 19  CREATININE 1.29* 1.10 1.20 1.10 1.30* 1.07  CALCIUM 8.5*  --  8.2*  --  8.2* 7.8*  MG  --   --   --   --  2.4 2.0  AST 35  --   --   --   --   --   ALT 35  --   --   --   --   --   ALKPHOS 91  --   --   --   --   --   BILITOT 0.9  --   --   --   --   --    ------------------------------------------------------------------------------------------------------------------ No results for input(s): CHOL, HDL, LDLCALC, TRIG, CHOLHDL, LDLDIRECT in the last 72 hours.  Lab Results   Component Value Date   HGBA1C 8.1 (H) 09/18/2018   ------------------------------------------------------------------------------------------------------------------ Recent Labs    09/07/2018 1801  TSH 0.785  T4TOTAL 9.8   ------------------------------------------------------------------------------------------------------------------ Recent Labs    09/06/18 0213  VITAMINB12 1,439*    Coagulation profile No results for input(s): INR, PROTIME in the last 168 hours.  No results for input(s): DDIMER in the last 72 hours.  Cardiac Enzymes No results for input(s): CKMB, TROPONINI, MYOGLOBIN in the last 168 hours.  Invalid input(s): CK ------------------------------------------------------------------------------------------------------------------ No results found for: BNP  Micro Results Recent Results (from the past 240 hour(s))  Urine culture     Status: None   Collection Time: 08/26/2018  5:51 PM  Result Value Ref Range Status   Specimen Description URINE, RANDOM  Final   Special Requests NONE  Final   Culture   Final    NO GROWTH Performed at [facility], [address] [place],  [address]    Report Status 09/06/2018 FINAL  Final    Radiology Reports Dg Chest 2 View  Result Date: 09/19/2018 CLINICAL DATA:  Altered mental status EXAM: CHEST - 2 VIEW COMPARISON:  None. FINDINGS: The heart size and mediastinal contours are within normal limits. Both lungs are clear. The visualized skeletal structures are unremarkable. Coronary artery stent noted. IMPRESSION: No active cardiopulmonary disease. Electronically Signed   By: Ulyses Jarred M.D.   On: 09/08/2018 18:32   Ct Head Wo Contrast  Result Date: 09/17/2018 CLINICAL DATA:  Altered mental status EXAM: CT HEAD WITHOUT CONTRAST TECHNIQUE: Contiguous axial images were obtained from the base of the skull through the vertex without intravenous contrast. COMPARISON:  Head CT 08/23/2018 FINDINGS: Brain: There is  no mass, hemorrhage or extra-axial collection. There is generalized atrophy without lobar predilection. Old left cerebellar infarct. There is periventricular hypoattenuation compatible with chronic microvascular disease. Vascular: Atherosclerotic calcification of the internal carotid arteries at the skull base. No abnormal hyperdensity of the major intracranial arteries or dural venous sinuses. Skull: The visualized skull base, calvarium and extracranial soft tissues are normal. Sinuses/Orbits: No fluid levels or advanced mucosal thickening of the visualized paranasal sinuses. No mastoid or middle ear effusion. The orbits are normal. IMPRESSION: 1. No acute intracranial abnormality. 2. 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 Electronically Signed   By: Cletus Gash.D.  On: 09/20/2018 18:43   Ct Head Wo Contrast  Result Date: 08/23/2018 CLINICAL DATA:  Multiple falls this past weekend.  Dementia. EXAM: CT HEAD WITHOUT CONTRAST TECHNIQUE: Contiguous axial images were obtained from the base of the skull through the vertex without intravenous contrast. COMPARISON:  08/11/2018 FINDINGS: Brain: Ventricles and cisterns are within normal. There is mild age related atrophic change. Mild chronic ischemic microvascular disease. No mass, mass effect, shift of midline structures or acute hemorrhage. No evidence of acute infarction. Subcentimeter hypodensity over the inferior left cerebellar hemisphere likely prominent CSF space versus old lacunar infarct. Vascular: No hyperdense vessel or unexpected calcification. Skull: Normal. Negative for fracture or focal lesion. Sinuses/Orbits: No acute finding. Other: None. IMPRESSION: No acute findings. Mild atrophy and chronic ischemic microvascular disease. Stable possible subcentimeter CSF space versus old lacunar infarct left cerebellar hemisphere. Electronically Signed   By: Marin Olp M.D.   On: 08/23/2018 15:12   Ct Head Wo Contrast  Result  Date: 08/11/2018 CLINICAL DATA:  Head and back pain after a fall. EXAM: CT HEAD WITHOUT CONTRAST CT CERVICAL SPINE WITHOUT CONTRAST TECHNIQUE: Multidetector CT imaging of the head and cervical spine was performed following the standard protocol without intravenous contrast. Multiplanar CT image reconstructions of the cervical spine were also generated. COMPARISON:  CT head 03/10/2018 FINDINGS: CT HEAD FINDINGS Brain: Diffuse cerebral atrophy. Ventricular dilatation consistent with central atrophy. Low-attenuation changes in the deep white matter consistent small vessel ischemia. No mass-effect or midline shift. No abnormal extra-axial fluid collections. Gray-white matter junctions are distinct. Basal cisterns are not effaced. No acute intracranial hemorrhage. Vascular: Moderate intracranial arterial vascular calcifications. Skull: Calvarium appears intact. No acute depressed skull fractures. Sinuses/Orbits: Paranasal sinuses and mastoid air cells are clear. Old right medial orbital wall deformity likely representing old fracture. Other: None. CT CERVICAL SPINE FINDINGS Alignment: Reversal of the usual cervical lordosis without anterior subluxation. This is likely due to patient positioning but ligamentous injury or muscle spasm could also have this appearance and are not excluded. Normal alignment of the facet joints. C1-2 articulation appears intact. Skull base and vertebrae: Skull base appears intact. No vertebral compression deformities. Focal sclerosis in the left pedicle at C3. This is nonspecific and probably represents a benign bone island although sclerotic metastasis is not entirely excluded. No other focal bone lesions identified. Soft tissues and spinal canal: No prevertebral soft tissue swelling. No abnormal paraspinal soft tissue mass or infiltration. Dystrophic calcification posterior to the spinous processes at C5 and C6. Disc levels: Degenerative changes throughout the cervical spine with narrowed  interspaces and endplate hypertrophic changes. Prominent degenerative changes at C1 to. Degenerative changes in the facet joints. Upper chest: Lung apices are clear. Other: Vascular calcifications. IMPRESSION: 1. No acute intracranial abnormalities. Chronic atrophy and small vessel ischemic changes. 2. Nonspecific reversal of the usual cervical lordosis. Degenerative changes throughout the cervical spine. No acute displaced fractures identified. Sclerosis in the left pedicle at C3 likely representing benign bone island. Electronically Signed   By: Lucienne Capers M.D.   On: 08/11/2018 01:31   Ct Cervical Spine Wo Contrast  Result Date: 08/11/2018 CLINICAL DATA:  Head and back pain after a fall. EXAM: CT HEAD WITHOUT CONTRAST CT CERVICAL SPINE WITHOUT CONTRAST TECHNIQUE: Multidetector CT imaging of the head and cervical spine was performed following the standard protocol without intravenous contrast. Multiplanar CT image reconstructions of the cervical spine were also generated. COMPARISON:  CT head 03/10/2018 FINDINGS: CT HEAD FINDINGS Brain: Diffuse cerebral atrophy. Ventricular dilatation consistent  with central atrophy. Low-attenuation changes in the deep white matter consistent small vessel ischemia. No mass-effect or midline shift. No abnormal extra-axial fluid collections. Gray-white matter junctions are distinct. Basal cisterns are not effaced. No acute intracranial hemorrhage. Vascular: Moderate intracranial arterial vascular calcifications. Skull: Calvarium appears intact. No acute depressed skull fractures. Sinuses/Orbits: Paranasal sinuses and mastoid air cells are clear. Old right medial orbital wall deformity likely representing old fracture. Other: None. CT CERVICAL SPINE FINDINGS Alignment: Reversal of the usual cervical lordosis without anterior subluxation. This is likely due to patient positioning but ligamentous injury or muscle spasm could also have this appearance and are not excluded.  Normal alignment of the facet joints. C1-2 articulation appears intact. Skull base and vertebrae: Skull base appears intact. No vertebral compression deformities. Focal sclerosis in the left pedicle at C3. This is nonspecific and probably represents a benign bone island although sclerotic metastasis is not entirely excluded. No other focal bone lesions identified. Soft tissues and spinal canal: No prevertebral soft tissue swelling. No abnormal paraspinal soft tissue mass or infiltration. Dystrophic calcification posterior to the spinous processes at C5 and C6. Disc levels: Degenerative changes throughout the cervical spine with narrowed interspaces and endplate hypertrophic changes. Prominent degenerative changes at C1 to. Degenerative changes in the facet joints. Upper chest: Lung apices are clear. Other: Vascular calcifications. IMPRESSION: 1. No acute intracranial abnormalities. Chronic atrophy and small vessel ischemic changes. 2. Nonspecific reversal of the usual cervical lordosis. Degenerative changes throughout the cervical spine. No acute displaced fractures identified. Sclerosis in the left pedicle at C3 likely representing benign bone island. Electronically Signed   By: Lucienne Capers M.D.   On: 08/11/2018 01:31    Time Spent in minutes  30   Lala Lund M.D on 09/07/2018 at 10:30 AM  To page go to [email or web address] - password [facility]

## 2018-09-08 ENCOUNTER — Inpatient Hospital Stay (HOSPITAL_COMMUNITY): Payer: PPO

## 2018-09-08 DIAGNOSIS — Z515 Encounter for palliative care: Secondary | ICD-10-CM

## 2018-09-08 LAB — BASIC METABOLIC PANEL
Anion gap: 13 (ref 5–15)
BUN: 19 mg/dL (ref 8–23)
CALCIUM: 8 mg/dL — AB (ref 8.9–10.3)
CO2: 23 mmol/L (ref 22–32)
Chloride: 104 mmol/L (ref 98–111)
Creatinine, Ser: 1.31 mg/dL — ABNORMAL HIGH (ref 0.61–1.24)
GFR calc Af Amer: 58 mL/min — ABNORMAL LOW (ref >60)
GFR calc non Af Amer: 50 mL/min — ABNORMAL LOW (ref >60)
Glucose, Bld: 180 mg/dL — ABNORMAL HIGH (ref 70–99)
Potassium: 3.8 mmol/L (ref 3.5–5.1)
Sodium: 140 mmol/L (ref 135–145)

## 2018-09-08 LAB — CBC
HCT: 47.9 % (ref 39.0–52.0)
Hemoglobin: 16.2 g/dL (ref 13.0–17.0)
MCH: 32.5 pg (ref 26.0–34.0)
MCHC: 33.8 g/dL (ref 30.0–36.0)
MCV: 96 fL (ref 80.0–100.0)
Platelets: 98 10*3/uL — ABNORMAL LOW (ref 150–400)
RBC: 4.99 MIL/uL (ref 4.22–5.81)
RDW: 13.4 % (ref 11.5–15.5)
WBC: 12.7 10*3/uL — ABNORMAL HIGH (ref 4.0–10.5)
nRBC: 0 % (ref 0.0–0.2)

## 2018-09-08 LAB — GLUCOSE, CAPILLARY
GLUCOSE-CAPILLARY: 166 mg/dL — AB (ref 70–99)
GLUCOSE-CAPILLARY: 168 mg/dL — AB (ref 70–99)
Glucose-Capillary: 194 mg/dL — ABNORMAL HIGH (ref 70–99)
Glucose-Capillary: 167 mg/dL — ABNORMAL HIGH (ref 70–99)

## 2018-09-08 MED ORDER — LACTATED RINGERS IV SOLN
INTRAVENOUS | Status: DC
  Administered 2018-09-08: 10:00:00 via INTRAVENOUS

## 2018-09-08 MED ORDER — BIOTENE DRY MOUTH MT LIQD: 15.0000 mL | OROMUCOSAL | Status: DC | PRN

## 2018-09-08 MED ORDER — POLYVINYL ALCOHOL 1.4 % OP SOLN
1.0000 [drp] | Freq: Four times a day (QID) | OPHTHALMIC | Status: DC | PRN
  Administered 2018-09-09: 1 [drp] via OPHTHALMIC
  Filled 2018-09-08: qty 15

## 2018-09-08 MED ORDER — GLYCOPYRROLATE 0.2 MG/ML IJ SOLN: 0.2000 mg | INTRAMUSCULAR | Status: DC | PRN

## 2018-09-08 NOTE — Progress Notes (Signed)
Daily Progress Note   Patient Name: Joseph Mora       Date: 09/08/2018 DOB: 03-26-1933  Age: 84 y.o. MRN#: [id number] Attending Physician: Thurnell Lose, MD Primary Care Physician: Elby Showers, MD Admit Date: 09/17/2018  Reason for Consultation/Follow-up: Establishing goals of care and Hospice Evaluation  Subjective: Patient mumbling, does not respond to me. Speaks some to family - incoherent. Per RN, refusing all PO intake. Was placed on oxygen overnight due to desaturation. Nasotracheal suction with bloody contents.   Length of Stay: 2  Current Medications: Scheduled Meds:  . aspirin EC  81 mg Oral Daily  . atorvastatin  40 mg Oral Daily  . donepezil  10 mg Oral QHS  . enoxaparin (LOVENOX) injection  40 mg Subcutaneous Q24H  . insulin aspart  0-9 Units Subcutaneous Q6H  . liver oil-zinc oxide   Topical Q6H  . metoprolol tartrate  50 mg Oral BID  . sodium chloride flush  3 mL Intravenous Q12H  . cyanocobalamin  100 mcg Oral Daily    Continuous Infusions: . lactated ringers 75 mL/hr at 09/08/18 1028    PRN Meds: haloperidol lactate, hydrALAZINE  Physical Exam         Constitutional:      Appearance: He is ill-appearing.  HENT:     Head: Normocephalic and atraumatic.  Cardiovascular:     Rate and Rhythm: Normal rate and regular rhythm.  Pulmonary:     Effort: Pulmonary effort is normal. No respiratory distress.     Breath sounds: Normal breath sounds.  Abdominal:     General: Bowel sounds are normal.     Palpations: Abdomen is soft.  Musculoskeletal:     Right lower leg: No edema.     Left lower leg: No edema.  Skin:    General: Skin is warm and dry.  Neurological:     Mental Status: He is disoriented.  Psychiatric:        Behavior: Behavior is uncooperative.          Cognition and Memory: Cognition is impaired. Memory is impaired.   Vital Signs: BP 110/71 (BP Location: Left Arm)   Pulse (!) 111   Temp 98.6 F (37 C) (Axillary)   Resp (!) 22   Ht 6' (1.829 m)   Wt 54.4 kg   SpO2 91%   BMI 16.27 kg/m  SpO2: SpO2: 91 % O2 Device: O2 Device: Nasal Cannula O2 Flow Rate: O2 Flow Rate (L/min): 4 L/min  Intake/output summary:   Intake/Output Summary (Last 24 hours) at 09/08/2018 1452 Last data filed at 09/08/2018 0534 Gross per 24 hour  Intake 600 ml  Output -  Net 600 ml   LBM:   Baseline Weight: Weight: 54.4 kg Most recent weight: Weight: 54.4 kg       Palliative Assessment/Data: PPS 10%    Flowsheet Rows     Most Recent Value  Intake Tab  Referral Department  Hospitalist  Unit at Time of Referral  Med/Surg Unit  Palliative Care Primary Diagnosis  Neurology  Date Notified  09/07/18  Palliative Care Type  New Palliative care  Reason for referral  Clarify Goals of Care, [place]  Date of Admission  09/06/18  Date first seen by Palliative Care  09/07/18  # of days Palliative referral response time  0 Day(s)  # of days IP prior to Palliative referral  1  Clinical Assessment  Palliative Performance Scale Score  20%  Psychosocial & Spiritual Assessment  Palliative Care Outcomes  Patient/Family meeting held?  Yes  Who was at the meeting?  wife  Palliative Care Outcomes  Clarified goals of care, Counseled regarding hospice, Provided end of life care assistance, Provided advance care planning, Provided psychosocial or spiritual support      Patient Active Problem List   Diagnosis Date Noted  . Palliative care by specialist   . Goals of care, counseling/discussion   . Acute metabolic encephalopathy 82/95/6213  . Hypernatremia 09/21/2018  . Dehydration 02/24/2018  . Hypoglycemia 10/14/2015  . CAD in native artery   . Memory difficulty 06/22/2015  . Tremor 06/22/2015  . Premature ventricular contractions  06/22/2015  . Essential hypertension 05/23/2012  . Benign prostatic hyperplasia 04/21/2011  . Chronic back pain 04/21/2011  . Insulin dependent diabetes mellitus ([place]) 04/21/2011  . Hx of adenomatous colonic polyps 04/21/2011  . History of Helicobacter infection 04/21/2011  . CAD (coronary artery disease) 04/03/2011  . Hyperlipidemia 04/03/2011    Palliative Care Assessment & Plan   HPI: 84 y.o. male  with past medical history of dementia, multiple falls, IDDM, and BPH admitted on 09/17/2018 with hypoglycemia and increased confusion. Patient resides at a memory care unit and requires total care. Patient found to be severely dehydrated. PMT consulted for [place].  Assessment: Family meeting held today with patient's wife and three children.   I introduced Palliative Medicine as specialized medical care for people living with serious illness. It focuses on providing relief from the symptoms and stress of a serious illness. The goal is to improve quality of life for both the patient and the family.  We discussed a brief life review of the patient. 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   They all confirm statements from patient's wife yesterday about his steep decline over the past month. When I shared that patient appears to be approaching end of life they agreed and said they were not surprised by this.   The difference between aggressive medical intervention and comfort care was considered in light of the patient's goals of care. They all agree they want to focus on comfort and dignity.   Advance directives, concepts specific to code status, artifical feeding and hydration, and rehospitalization were considered and discussed. Confirmed DNR, would not want life artificially prolonged.   Hospice and Palliative Care services outpatient were explained and offered. Family interested in residential hospice placement - specifically request [facility].  We discussed shifting  patient's current care to the type of care he would receive at hospice facility - comfort care. They agree. Specifically discussed discontinuing IV fluids and medications not necessary for comfort. Family agrees.   Questions and concerns were addressed.   The family was encouraged to call with questions or concerns.   Recommendations/Plan:  Comfort care - discontinued IV fluids and medications not necessary for comfort  Residential hospice placement - CSW consulted - family requests [facility]  Continue PRN haldol  Will add PRN robinul for excessive secretions  Patient allergic to morphine - suggest PRN fentanyl if needed for dyspnea  Goals of Care and Additional Recommendations:  Limitations on Scope of Treatment: Full Comfort Care  Code Status:  DNR  Prognosis:   < 2 weeks  Discharge Planning:  Hospice facility  Care plan was discussed with Dr. Bosie Clos [place], and patient's family  Thank you for allowing the Palliative Medicine Team to assist in the care of this patient.   Total Time 35 minutes Prolonged Time Billed  no       Greater than 50%  of this time was spent counseling and coordinating care related to the above assessment and plan.  Juel Burrow, DNP, [facility] Palliative Medicine Team Team Phone # [phone]  Pager [phone]

## 2018-09-08 NOTE — Progress Notes (Signed)
Patient has found some secretion around his mouth. Bilateral chest sound was course cracle.  spo2 78%, Bp 148/78 and pulse 100. Called  Rapid and respiratory. Respiratory performed the naso pharynx suction and started 02 4 lit by nasal canula. Notified on call provider and done portable chest x-ray. Patient's spo2 is maintaining  on 97% with 02 4 lit by nasal canula. Will continue monitor the patient and notifiy as needed.

## 2018-09-08 NOTE — Progress Notes (Signed)
[facility])  Received referral from [facility] for residential hospice at [facility].  Unfortunately we do not have a bed to offer today.  [facility] will continue to follow up with LCSW and family daily or as bed availability changes.  Thank you for this referral, Venia Carbon BSN, RN [facility] Liaison (listed in [place]) [phone]

## 2018-09-08 NOTE — Progress Notes (Signed)
Nutrition Brief Note  RD drawn to pt due to underweight BMI. Chart reviewed. Pt now transitioning to comfort care.  No further nutrition interventions warranted at this time.  Please consult as needed.    Gaynell Face, MS, RD, LDN Inpatient Clinical Dietitian Pager: [phone] Weekend/After Hours: [phone]

## 2018-09-08 NOTE — Progress Notes (Addendum)
Upon arrival into the room at 0630, pt's O2 saturation was 84 and patient was having rhonchi and course crackling lung sounds. Respiratory therapist called to bedside and she completed NTS suctioning. Upon suctioning, contents were bloody and coffee ground in appearance. Patient's O2 saturations returned to the 90's. Schorr,NP notified of change in patient status. Schorr,NP returned page and stated that palliative would need to speak with patient's family about decisions regarding comfort care.

## 2018-09-08 NOTE — Progress Notes (Signed)
CSW received referral for residential hospice placement. CSW sent referral to [facility] for review. They do not have beds today but will check back in tomorrow.   Percell Locus Lorenza Shakir LCSW [phone]

## 2018-09-08 NOTE — Progress Notes (Signed)
PROGRESS NOTE                                                                                                                                                                                                             Patient Demographics:    Joseph Mora, is a 84 y.o. male, DOB - Sep 19, 1933, MLY:[id number]  Admit date - 09/20/2018   Admitting Physician Vilma Prader, MD  Outpatient Primary MD for the patient is Baxley, Cresenciano Lick, MD  LOS - 2  Chief Complaint  Patient presents with  . Hypoglycemia       Brief Narrative  This is an 84 year old male with medical problems including dementia requiring total care residing at a memory care unit, insulin-dependent diabetes, increased cardiovascular risk, and BPH presenting with hypoglycemia and increased confusion, history was provided by patient's spouse.  Upon arrival patient was found to be severely dehydrated with ARF, had severe hypernatremia and hypoglycemia which was persistent and he had severe metabolic encephalopathy and was obtunded.    Subjective:   In bed, he was obtunded yesterday now he is awake but extremely confused.  Unable to provide any history or cooperate in exam.   Assessment  & Plan :     1.  Acute metabolic encephalopathy caused by combination of severe dehydration, ARF and persistent hypoglycemia.  He received IV D50, glucagon along with D5W with resolution of his hyponatremia and improvement in his dehydration.  Hold all long-acting insulin, transition to LR, switched from D5W to LR.  Continue CBGs every 6 hours with sliding scale only.  2.  Hypernatremia.  Dehydration resolved after D5W was switched to LR.  3.  ARF.  Due to dehydration, resolved after hydration with IV fluids.  4.  Insulin-dependent DM type II.  Currently hypoglycemic.  Holding all diabetic medications, D5 drip and monitor.  Lab Results  Component Value Date   HGBA1C 8.1 (H) 08/30/2018   CBG (last 3)  Recent Labs   09/07/18 1737 09/08/18 0101 09/08/18 0559  GLUCAP 124* 194* 167*    5.  CAD.  No acute issues once able to take oral medications he will resume his aspirin, statin and metoprolol.  6.  Advanced dementia.  Remains at risk for delirium.  Supportive care.  Minimize narcotics and benzodiazepines.     Note I think he has pretty advanced dementia and will develop dehydration and hypoglycemia again once discharged.  Will involve palliative care for goals of care.  He is DNR.  Likely would benefit from Residential Hospice.    Family Communication  :  wife  Code Status :  DNR  Disposition Plan  :  Med  Consults  : Pall. care for goals of care  Procedures  :    CT head and chest x-ray.  Both nonacute.  DVT Prophylaxis  :  Lovenox   Lab Results  Component Value Date   PLT 98 (L) 09/08/2018    Diet :  Diet Order            Diet regular Room service appropriate? Yes; Fluid consistency: Thin  Diet effective now               Inpatient Medications Scheduled Meds: . aspirin EC  81 mg Oral Daily  . atorvastatin  40 mg Oral Daily  . donepezil  10 mg Oral QHS  . enoxaparin (LOVENOX) injection  40 mg Subcutaneous Q24H  . insulin aspart  0-9 Units Subcutaneous Q6H  . liver oil-zinc oxide   Topical Q6H  . metoprolol tartrate  50 mg Oral BID  . sodium chloride flush  3 mL Intravenous Q12H  . cyanocobalamin  100 mcg Oral Daily   Continuous Infusions: . lactated ringers     PRN Meds:.  Antibiotics  :   Anti-infectives (From admission, onward)   None          Objective:   Vitals:   09/07/18 2249 09/08/18 0300 09/08/18 0333 09/08/18 0607  BP:   (!) 148/78 110/71  Pulse: (!) 102  100 (!) 111  Resp:   20 (!) 22  Temp:    98.6 F (37 C)  TempSrc:    Axillary  SpO2: 93% 95% 98% 91%  Weight:      Height:        Wt Readings from Last 3 Encounters:  09/17/2018 54.4 kg  05/19/18 87 kg  03/30/18 83.9 kg     Intake/Output Summary (Last 24 hours) at 09/08/2018  0927 Last data filed at 09/08/2018 0534 Gross per 24 hour  Intake 600 ml  Output -  Net 600 ml     Physical Exam  He is awake today but completely confused, moving all 4 extremities by himself, [place].AT, Supple Neck,No JVD, No cervical lymphadenopathy appriciated.  Symmetrical Chest wall movement, Good air movement bilaterally, CTAB RRR,No Gallops, Rubs or new Murmurs, No Parasternal Heave +ve B.Sounds, Abd Soft, No tenderness, No organomegaly appriciated, No rebound - guarding or rigidity. No Cyanosis, Clubbing or edema, No new Rash or bruise     Data Review:    CBC Recent Labs  Lab 09/08/2018 1801 09/06/2018 1815 09/08/2018 2110 09/06/18 0213 09/08/18 0351  WBC 16.2*  --   --  13.1* 12.7*  HGB 17.4* 18.0* 16.7 16.0 16.2  HCT 56.5* 53.0* 49.0 51.4 47.9  PLT 121*  --   --  122* 98*  MCV 101.6*  --   --  100.6* 96.0  MCH 31.3  --   --  31.3 32.5  MCHC 30.8  --   --  31.1 33.8  RDW 13.9  --   --  14.0 13.4  LYMPHSABS 0.8  --   --   --   --   MONOABS 1.6*  --   --   --   --   EOSABS 0.0  --   --   --   --   BASOSABS 0.0  --   --   --   --  Chemistries  Recent Labs  Lab 09/08/2018 1801  09/03/2018 2057 09/11/2018 2110 09/06/18 0213 09/07/18 0331 09/08/18 0351  NA 153*   < > 150* 151* 156* 144 140  K 3.4*   < > 3.8 3.6 4.0 3.6 3.8  CL 112*   < > 113* 113* 115* 106 104  CO2 25  --  26  --  29 27 23   GLUCOSE 218*   < > 105* 104* <20* 185* 180*  BUN 41*   < > 42* 41* 36* 19 19  CREATININE 1.29*   < > 1.20 1.10 1.30* 1.07 1.31*  CALCIUM 8.5*  --  8.2*  --  8.2* 7.8* 8.0*  MG  --   --   --   --  2.4 2.0  --   AST 35  --   --   --   --   --   --   ALT 35  --   --   --   --   --   --   ALKPHOS 91  --   --   --   --   --   --   BILITOT 0.9  --   --   --   --   --   --    < > = values in this interval not displayed.   ------------------------------------------------------------------------------------------------------------------ No results for input(s): CHOL, HDL,  LDLCALC, TRIG, CHOLHDL, LDLDIRECT in the last 72 hours.  Lab Results  Component Value Date   HGBA1C 8.1 (H) 09/01/2018   ------------------------------------------------------------------------------------------------------------------ Recent Labs    09/02/2018 1801  TSH 0.785  T4TOTAL 9.8   ------------------------------------------------------------------------------------------------------------------ Recent Labs    09/06/18 0213  VITAMINB12 1,439*    Coagulation profile No results for input(s): INR, PROTIME in the last 168 hours.  No results for input(s): DDIMER in the last 72 hours.  Cardiac Enzymes No results for input(s): CKMB, TROPONINI, MYOGLOBIN in the last 168 hours.  Invalid input(s): CK ------------------------------------------------------------------------------------------------------------------ No results found for: BNP  Micro Results Recent Results (from the past 240 hour(s))  Urine culture     Status: None   Collection Time: 09/01/2018  5:51 PM  Result Value Ref Range Status   Specimen Description URINE, RANDOM  Final   Special Requests NONE  Final   Culture   Final    NO GROWTH Performed at [facility], [address] [place], [place] [address]    Report Status 09/06/2018 FINAL  Final    Radiology Reports Dg Chest 2 View  Result Date: 09/20/2018 CLINICAL DATA:  Altered mental status EXAM: CHEST - 2 VIEW COMPARISON:  None. FINDINGS: The heart size and mediastinal contours are within normal limits. Both lungs are clear. The visualized skeletal structures are unremarkable. Coronary artery stent noted. IMPRESSION: No active cardiopulmonary disease. Electronically Signed   By: Ulyses Jarred M.D.   On: 09/11/2018 18:32   Ct Head Wo Contrast  Result Date: 09/12/2018 CLINICAL DATA:  Altered mental status EXAM: CT HEAD WITHOUT CONTRAST TECHNIQUE: Contiguous axial images were obtained from the base of the skull through the vertex without  intravenous contrast. COMPARISON:  Head CT 08/23/2018 FINDINGS: Brain: There is no mass, hemorrhage or extra-axial collection. There is generalized atrophy without lobar predilection. Old left cerebellar infarct. There is periventricular hypoattenuation compatible with chronic microvascular disease. Vascular: Atherosclerotic calcification of the internal carotid arteries at the skull base. No abnormal hyperdensity of the major intracranial arteries or dural venous sinuses. Skull: The visualized skull base,  calvarium and extracranial soft tissues are normal. Sinuses/Orbits: No fluid levels or advanced mucosal thickening of the visualized paranasal sinuses. No mastoid or middle ear effusion. The orbits are normal. IMPRESSION: 1. No acute intracranial abnormality. 2. 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 Electronically Signed   By: Ulyses Jarred M.D.   On: 08/23/2018 18:43   Ct Head Wo Contrast  Result Date: 08/23/2018 CLINICAL DATA:  Multiple falls this past weekend.  Dementia. EXAM: CT HEAD WITHOUT CONTRAST TECHNIQUE: Contiguous axial images were obtained from the base of the skull through the vertex without intravenous contrast. COMPARISON:  08/11/2018 FINDINGS: Brain: Ventricles and cisterns are within normal. There is mild age related atrophic change. Mild chronic ischemic microvascular disease. No mass, mass effect, shift of midline structures or acute hemorrhage. No evidence of acute infarction. Subcentimeter hypodensity over the inferior left cerebellar hemisphere likely prominent CSF space versus old lacunar infarct. Vascular: No hyperdense vessel or unexpected calcification. Skull: Normal. Negative for fracture or focal lesion. Sinuses/Orbits: No acute finding. Other: None. IMPRESSION: No acute findings. Mild atrophy and chronic ischemic microvascular disease. Stable possible subcentimeter CSF space versus old lacunar infarct left cerebellar hemisphere. Electronically Signed    By: Marin Olp M.D.   On: 08/23/2018 15:12   Ct Head Wo Contrast  Result Date: 08/11/2018 CLINICAL DATA:  Head and back pain after a fall. EXAM: CT HEAD WITHOUT CONTRAST CT CERVICAL SPINE WITHOUT CONTRAST TECHNIQUE: Multidetector CT imaging of the head and cervical spine was performed following the standard protocol without intravenous contrast. Multiplanar CT image reconstructions of the cervical spine were also generated. COMPARISON:  CT head 03/10/2018 FINDINGS: CT HEAD FINDINGS Brain: Diffuse cerebral atrophy. Ventricular dilatation consistent with central atrophy. Low-attenuation changes in the deep white matter consistent small vessel ischemia. No mass-effect or midline shift. No abnormal extra-axial fluid collections. Gray-white matter junctions are distinct. Basal cisterns are not effaced. No acute intracranial hemorrhage. Vascular: Moderate intracranial arterial vascular calcifications. Skull: Calvarium appears intact. No acute depressed skull fractures. Sinuses/Orbits: Paranasal sinuses and mastoid air cells are clear. Old right medial orbital wall deformity likely representing old fracture. Other: None. CT CERVICAL SPINE FINDINGS Alignment: Reversal of the usual cervical lordosis without anterior subluxation. This is likely due to patient positioning but ligamentous injury or muscle spasm could also have this appearance and are not excluded. Normal alignment of the facet joints. C1-2 articulation appears intact. Skull base and vertebrae: Skull base appears intact. No vertebral compression deformities. Focal sclerosis in the left pedicle at C3. This is nonspecific and probably represents a benign bone island although sclerotic metastasis is not entirely excluded. No other focal bone lesions identified. Soft tissues and spinal canal: No prevertebral soft tissue swelling. No abnormal paraspinal soft tissue mass or infiltration. Dystrophic calcification posterior to the spinous processes at C5 and C6.  Disc levels: Degenerative changes throughout the cervical spine with narrowed interspaces and endplate hypertrophic changes. Prominent degenerative changes at C1 to. Degenerative changes in the facet joints. Upper chest: Lung apices are clear. Other: Vascular calcifications. IMPRESSION: 1. No acute intracranial abnormalities. Chronic atrophy and small vessel ischemic changes. 2. Nonspecific reversal of the usual cervical lordosis. Degenerative changes throughout the cervical spine. No acute displaced fractures identified. Sclerosis in the left pedicle at C3 likely representing benign bone island. Electronically Signed   By: Lucienne Capers M.D.   On: 08/11/2018 01:31   Ct Cervical Spine Wo Contrast  Result Date: 08/11/2018 CLINICAL DATA:  Head and back pain after a fall.  EXAM: CT HEAD WITHOUT CONTRAST CT CERVICAL SPINE WITHOUT CONTRAST TECHNIQUE: Multidetector CT imaging of the head and cervical spine was performed following the standard protocol without intravenous contrast. Multiplanar CT image reconstructions of the cervical spine were also generated. COMPARISON:  CT head 03/10/2018 FINDINGS: CT HEAD FINDINGS Brain: Diffuse cerebral atrophy. Ventricular dilatation consistent with central atrophy. Low-attenuation changes in the deep white matter consistent small vessel ischemia. No mass-effect or midline shift. No abnormal extra-axial fluid collections. Gray-white matter junctions are distinct. Basal cisterns are not effaced. No acute intracranial hemorrhage. Vascular: Moderate intracranial arterial vascular calcifications. Skull: Calvarium appears intact. No acute depressed skull fractures. Sinuses/Orbits: Paranasal sinuses and mastoid air cells are clear. Old right medial orbital wall deformity likely representing old fracture. Other: None. CT CERVICAL SPINE FINDINGS Alignment: Reversal of the usual cervical lordosis without anterior subluxation. This is likely due to patient positioning but ligamentous  injury or muscle spasm could also have this appearance and are not excluded. Normal alignment of the facet joints. C1-2 articulation appears intact. Skull base and vertebrae: Skull base appears intact. No vertebral compression deformities. Focal sclerosis in the left pedicle at C3. This is nonspecific and probably represents a benign bone island although sclerotic metastasis is not entirely excluded. No other focal bone lesions identified. Soft tissues and spinal canal: No prevertebral soft tissue swelling. No abnormal paraspinal soft tissue mass or infiltration. Dystrophic calcification posterior to the spinous processes at C5 and C6. Disc levels: Degenerative changes throughout the cervical spine with narrowed interspaces and endplate hypertrophic changes. Prominent degenerative changes at C1 to. Degenerative changes in the facet joints. Upper chest: Lung apices are clear. Other: Vascular calcifications. IMPRESSION: 1. No acute intracranial abnormalities. Chronic atrophy and small vessel ischemic changes. 2. Nonspecific reversal of the usual cervical lordosis. Degenerative changes throughout the cervical spine. No acute displaced fractures identified. Sclerosis in the left pedicle at C3 likely representing benign bone island. Electronically Signed   By: Lucienne Capers M.D.   On: 08/11/2018 01:31   Dg Chest Port 1 View  Result Date: 09/08/2018 CLINICAL DATA:  Hypoxia. Question aspiration. EXAM: PORTABLE CHEST 1 VIEW COMPARISON:  Radiographs 2 days ago 09/10/2018 FINDINGS: Low lung volumes persist. Normal heart size and mediastinal contours for technique. No focal airspace disease, pleural effusion or pneumothorax. Unchanged osseous structures. IMPRESSION: Persistent low lung volumes without acute abnormality. Electronically Signed   By: Keith Rake M.D.   On: 09/08/2018 05:04    Time Spent in minutes  30   Lala Lund M.D on 09/08/2018 at 9:27 AM  To page go to [email or web address] - password  [facility]

## 2018-09-09 LAB — MRSA PCR SCREENING: MRSA by PCR: NEGATIVE

## 2018-09-09 MED ORDER — FENTANYL CITRATE (PF) 100 MCG/2ML IJ SOLN
25.0000 ug | INTRAMUSCULAR | Status: DC | PRN
  Administered 2018-09-09 (×4): 25 ug via INTRAVENOUS
  Filled 2018-09-09 (×4): qty 2

## 2018-09-09 MED ORDER — ORAL CARE MOUTH RINSE
15.0000 mL | Freq: Two times a day (BID) | OROMUCOSAL | Status: DC
  Administered 2018-09-09 (×2): 15 mL via OROMUCOSAL

## 2018-09-09 MED ORDER — MORPHINE SULFATE (CONCENTRATE) 10 MG/0.5ML PO SOLN: 10.0000 mg | ORAL | 0 refills | Status: AC | PRN

## 2018-09-09 MED ORDER — DIPHENHYDRAMINE HCL 12.5 MG/5ML PO SYRP: 25.0000 mg | ORAL_SOLUTION | ORAL | 0 refills | Status: AC | PRN

## 2018-09-09 MED ORDER — LORAZEPAM 2 MG/ML PO CONC: 1.0000 mg | Freq: Four times a day (QID) | ORAL | 0 refills | Status: AC | PRN

## 2018-09-09 MED ORDER — FENTANYL CITRATE (PF) 100 MCG/2ML IJ SOLN
25.0000 ug | Freq: Once | INTRAMUSCULAR | Status: AC
  Administered 2018-09-09: 25 ug via INTRAVENOUS
  Filled 2018-09-09: qty 2

## 2018-09-10 NOTE — Progress Notes (Addendum)
Patient expired at 2159, family present at bedside.  Confirmed with Clydene Laming, no palpable pulses with assess.  Patient status post fall from SNF per nurse Clydene Laming, he notified Medical Examiner for follow up. Patient transferred to morgue at [place].  Family aware of situation.

## 2018-09-12 DIAGNOSIS — M545 Low back pain: Secondary | ICD-10-CM | POA: Diagnosis not present

## 2018-09-12 DIAGNOSIS — R079 Chest pain, unspecified: Secondary | ICD-10-CM | POA: Diagnosis not present

## 2018-09-12 DIAGNOSIS — R269 Unspecified abnormalities of gait and mobility: Secondary | ICD-10-CM | POA: Diagnosis not present

## 2018-09-12 DIAGNOSIS — E119 Type 2 diabetes mellitus without complications: Secondary | ICD-10-CM | POA: Diagnosis not present

## 2018-09-12 DIAGNOSIS — Z7982 Long term (current) use of aspirin: Secondary | ICD-10-CM | POA: Diagnosis not present

## 2018-09-12 DIAGNOSIS — E785 Hyperlipidemia, unspecified: Secondary | ICD-10-CM | POA: Diagnosis not present

## 2018-09-12 DIAGNOSIS — I2510 Atherosclerotic heart disease of native coronary artery without angina pectoris: Secondary | ICD-10-CM | POA: Diagnosis not present

## 2018-09-12 DIAGNOSIS — M6281 Muscle weakness (generalized): Secondary | ICD-10-CM | POA: Diagnosis not present

## 2018-09-12 DIAGNOSIS — Z794 Long term (current) use of insulin: Secondary | ICD-10-CM | POA: Diagnosis not present

## 2018-09-22 NOTE — Discharge Summary (Signed)
Joseph Mora NKN:[id number] DOB: 13-Oct-1932 DOA: 08/26/2018  PCP: Elby Showers, MD  Admit date: 08/31/2018  Discharge date: 2018-09-12  Admitted From: SNF  Disposition: Residential hospice   Recommendations for Outpatient Follow-up:   Follow up with PCP in 1-2 weeks  PCP Please obtain BMP/CBC, 2 view CXR in 1week,  (see Discharge instructions)   PCP Please follow up on the following pending results:    Home Health: None Equipment/Devices: None Consultations: Pall. care Discharge Condition: Guarded CODE STATUS: DNR Diet Recommendation: Soft diet for comfort only with feeding assistance and aspiration precautions, very high aspiration risk    Chief Complaint  Patient presents with  . Hypoglycemia     Brief history of present illness from the day of admission and additional interim summary    This is an 86 year old male with medical problems including dementia requiring total care residing at a memory care unit, insulin-dependent diabetes, increased cardiovascular risk,and BPH presenting with hypoglycemia and increased confusion, history was provided by patient's spouse.  Upon arrival patient was found to be severely dehydrated with ARF, had severe hypernatremia and hypoglycemia which was persistent and he had severe metabolic encephalopathy and was obtunded.                                                                  Hospital Course   1.  Acute metabolic encephalopathy caused by combination of severe dehydration, ARF and persistent hypoglycemia.  He received IV D50, glucagon along with D5W with resolution of his hypoglycemia, hyponatremia and improvement in his dehydration.   His underlying problem is advanced dementia his poor oral intake is likely to continue and the cycle is likely to repeat,  we had detailed discussions with patient's family along with palliative care, it was appropriately decided that he will be best served with comfort care at a residential hospice only.  All other nonessential medications will be stopped and goal of care will be directed towards comfort now, he will be discharged to a residential hospice.  2.  Hypernatremia.  Dehydration resolved after D5W.  3.  ARF.  Due to dehydration, after IV fluids.  4.  Insulin-dependent DM type II.    All non-comfort medications have been stopped.  5.  CAD.  No acute issues , on comfort medications have been stopped.  6.  Advanced dementia.  Remains at risk for delirium.  Supportive care.  Minimize narcotics and benzodiazepines.        Discharge diagnosis     Principal Problem:   Acute metabolic encephalopathy Active Problems:   Hyperlipidemia   Benign prostatic hyperplasia   Insulin dependent diabetes mellitus (Joseph Mora)   Essential hypertension   Hypoglycemia   CAD in native artery   Dehydration   Hypernatremia   Palliative care by specialist  Goals of care, counseling/discussion   Comfort measures only status    Discharge instructions    Discharge Instructions    Discharge instructions   Complete by:  As directed    Disposition.  Residential hospice. Condition.  Guarded CODE STATUS.  DNR Diet.  For comfort only, soft diet with feeding assistance and aspiration precautions.  Very high aspiration risk. Activity.  With assistance and fall precautions as tolerated.      Discharge Medications   Allergies as of September 13, 2018      Reactions   Morphine And Related Itching   Penicillins Hives   Has patient had a PCN reaction causing immediate rash, facial/tongue/throat swelling, SOB or lightheadedness with hypotension: No Has patient had a PCN reaction causing severe rash involving mucus membranes or skin necrosis: No Has patient had a PCN reaction that required hospitalization: No Has patient  had a PCN reaction occurring within the last 10 years: No If all of the above answers are "NO", then may proceed with Cephalosporin use.      Medication List    STOP taking these medications   ASPIRIN 81 81 MG EC tablet Generic drug:  aspirin   atorvastatin 40 MG tablet Commonly known as:  LIPITOR   buPROPion 150 MG 24 hr tablet Commonly known as:  WELLBUTRIN XL   cyanocobalamin 100 MCG tablet   donepezil 10 MG tablet Commonly known as:  ARICEPT   insulin lispro 100 UNIT/ML injection Commonly known as:  HUMALOG   JANUVIA 25 MG tablet Generic drug:  sitaGLIPtin   JANUVIA 50 MG tablet Generic drug:  sitaGLIPtin   LEVEMIR 100 UNIT/ML injection Generic drug:  insulin detemir   Melatonin 3 MG Tabs   memantine 10 MG tablet Commonly known as:  NAMENDA   metoprolol tartrate 50 MG tablet Commonly known as:  LOPRESSOR   mirabegron ER 50 MG Tb24 tablet Commonly known as:  MYRBETRIQ   nitroGLYCERIN 0.4 MG SL tablet Commonly known as:  NITROSTAT   PRESCRIPTION MEDICATION   RESOURCE 2.0 Liqd   risperiDONE 0.25 MG tablet Commonly known as:  RISPERDAL   sertraline 50 MG tablet Commonly known as:  ZOLOFT   tamsulosin 0.4 MG Caps capsule Commonly known as:  FLOMAX     TAKE these medications   diphenhydrAMINE 12.5 MG/5ML syrup Commonly known as:  BENYLIN Take 10 mLs (25 mg total) by mouth every 4 (four) hours as needed for allergies.   LORazepam 2 MG/ML concentrated solution Commonly known as:  ATIVAN Take 0.5 mLs (1 mg total) by mouth every 6 (six) hours as needed for anxiety.   morphine CONCENTRATE 10 MG/0.5ML Soln concentrated solution Take 0.5 mLs (10 mg total) by mouth every 3 (three) hours as needed for moderate pain or severe pain.         Major procedures and Radiology Reports - PLEASE review detailed and final reports thoroughly  -         Dg Chest 2 View  Result Date: 08/25/2018 CLINICAL DATA:  Altered mental status EXAM: CHEST - 2 VIEW  COMPARISON:  None. FINDINGS: The heart size and mediastinal contours are within normal limits. Both lungs are clear. The visualized skeletal structures are unremarkable. Coronary artery stent noted. IMPRESSION: No active cardiopulmonary disease. Electronically Signed   By: Ulyses Jarred M.D.   On: 08/23/2018 18:32   Ct Head Wo Contrast  Result Date: 09/19/2018 CLINICAL DATA:  Altered mental status EXAM: CT HEAD WITHOUT CONTRAST TECHNIQUE: Contiguous axial images were obtained from  the base of the skull through the vertex without intravenous contrast. COMPARISON:  Head CT 08/23/2018 FINDINGS: Brain: There is no mass, hemorrhage or extra-axial collection. There is generalized atrophy without lobar predilection. Old left cerebellar infarct. There is periventricular hypoattenuation compatible with chronic microvascular disease. Vascular: Atherosclerotic calcification of the internal carotid arteries at the skull base. No abnormal hyperdensity of the major intracranial arteries or dural venous sinuses. Skull: The visualized skull base, calvarium and extracranial soft tissues are normal. Sinuses/Orbits: No fluid levels or advanced mucosal thickening of the visualized paranasal sinuses. No mastoid or middle ear effusion. The orbits are normal. IMPRESSION: 1. No acute intracranial abnormality. 2. 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 Electronically Signed   By: Ulyses Jarred M.D.   On: 09/06/2018 18:43   Ct Head Wo Contrast  Result Date: 08/23/2018 CLINICAL DATA:  Multiple falls this past weekend.  Dementia. EXAM: CT HEAD WITHOUT CONTRAST TECHNIQUE: Contiguous axial images were obtained from the base of the skull through the vertex without intravenous contrast. COMPARISON:  08/11/2018 FINDINGS: Brain: Ventricles and cisterns are within normal. There is mild age related atrophic change. Mild chronic ischemic microvascular disease. No mass, mass effect, shift of midline structures or acute  hemorrhage. No evidence of acute infarction. Subcentimeter hypodensity over the inferior left cerebellar hemisphere likely prominent CSF space versus old lacunar infarct. Vascular: No hyperdense vessel or unexpected calcification. Skull: Normal. Negative for fracture or focal lesion. Sinuses/Orbits: No acute finding. Other: None. IMPRESSION: No acute findings. Mild atrophy and chronic ischemic microvascular disease. Stable possible subcentimeter CSF space versus old lacunar infarct left cerebellar hemisphere. Electronically Signed   By: Marin Olp M.D.   On: 08/23/2018 15:12   Ct Head Wo Contrast  Result Date: 08/11/2018 CLINICAL DATA:  Head and back pain after a fall. EXAM: CT HEAD WITHOUT CONTRAST CT CERVICAL SPINE WITHOUT CONTRAST TECHNIQUE: Multidetector CT imaging of the head and cervical spine was performed following the standard protocol without intravenous contrast. Multiplanar CT image reconstructions of the cervical spine were also generated. COMPARISON:  CT head 03/10/2018 FINDINGS: CT HEAD FINDINGS Brain: Diffuse cerebral atrophy. Ventricular dilatation consistent with central atrophy. Low-attenuation changes in the deep white matter consistent small vessel ischemia. No mass-effect or midline shift. No abnormal extra-axial fluid collections. Gray-white matter junctions are distinct. Basal cisterns are not effaced. No acute intracranial hemorrhage. Vascular: Moderate intracranial arterial vascular calcifications. Skull: Calvarium appears intact. No acute depressed skull fractures. Sinuses/Orbits: Paranasal sinuses and mastoid air cells are clear. Old right medial orbital wall deformity likely representing old fracture. Other: None. CT CERVICAL SPINE FINDINGS Alignment: Reversal of the usual cervical lordosis without anterior subluxation. This is likely due to patient positioning but ligamentous injury or muscle spasm could also have this appearance and are not excluded. Normal alignment of the  facet joints. C1-2 articulation appears intact. Skull base and vertebrae: Skull base appears intact. No vertebral compression deformities. Focal sclerosis in the left pedicle at C3. This is nonspecific and probably represents a benign bone island although sclerotic metastasis is not entirely excluded. No other focal bone lesions identified. Soft tissues and spinal canal: No prevertebral soft tissue swelling. No abnormal paraspinal soft tissue mass or infiltration. Dystrophic calcification posterior to the spinous processes at C5 and C6. Disc levels: Degenerative changes throughout the cervical spine with narrowed interspaces and endplate hypertrophic changes. Prominent degenerative changes at C1 to. Degenerative changes in the facet joints. Upper chest: Lung apices are clear. Other: Vascular calcifications. IMPRESSION: 1. No acute  intracranial abnormalities. Chronic atrophy and small vessel ischemic changes. 2. Nonspecific reversal of the usual cervical lordosis. Degenerative changes throughout the cervical spine. No acute displaced fractures identified. Sclerosis in the left pedicle at C3 likely representing benign bone island. Electronically Signed   By: Lucienne Capers M.D.   On: 08/11/2018 01:31   Ct Cervical Spine Wo Contrast  Result Date: 08/11/2018 CLINICAL DATA:  Head and back pain after a fall. EXAM: CT HEAD WITHOUT CONTRAST CT CERVICAL SPINE WITHOUT CONTRAST TECHNIQUE: Multidetector CT imaging of the head and cervical spine was performed following the standard protocol without intravenous contrast. Multiplanar CT image reconstructions of the cervical spine were also generated. COMPARISON:  CT head 03/10/2018 FINDINGS: CT HEAD FINDINGS Brain: Diffuse cerebral atrophy. Ventricular dilatation consistent with central atrophy. Low-attenuation changes in the deep white matter consistent small vessel ischemia. No mass-effect or midline shift. No abnormal extra-axial fluid collections. Gray-white matter  junctions are distinct. Basal cisterns are not effaced. No acute intracranial hemorrhage. Vascular: Moderate intracranial arterial vascular calcifications. Skull: Calvarium appears intact. No acute depressed skull fractures. Sinuses/Orbits: Paranasal sinuses and mastoid air cells are clear. Old right medial orbital wall deformity likely representing old fracture. Other: None. CT CERVICAL SPINE FINDINGS Alignment: Reversal of the usual cervical lordosis without anterior subluxation. This is likely due to patient positioning but ligamentous injury or muscle spasm could also have this appearance and are not excluded. Normal alignment of the facet joints. C1-2 articulation appears intact. Skull base and vertebrae: Skull base appears intact. No vertebral compression deformities. Focal sclerosis in the left pedicle at C3. This is nonspecific and probably represents a benign bone island although sclerotic metastasis is not entirely excluded. No other focal bone lesions identified. Soft tissues and spinal canal: No prevertebral soft tissue swelling. No abnormal paraspinal soft tissue mass or infiltration. Dystrophic calcification posterior to the spinous processes at C5 and C6. Disc levels: Degenerative changes throughout the cervical spine with narrowed interspaces and endplate hypertrophic changes. Prominent degenerative changes at C1 to. Degenerative changes in the facet joints. Upper chest: Lung apices are clear. Other: Vascular calcifications. IMPRESSION: 1. No acute intracranial abnormalities. Chronic atrophy and small vessel ischemic changes. 2. Nonspecific reversal of the usual cervical lordosis. Degenerative changes throughout the cervical spine. No acute displaced fractures identified. Sclerosis in the left pedicle at C3 likely representing benign bone island. Electronically Signed   By: Lucienne Capers M.D.   On: 08/11/2018 01:31   Dg Chest Port 1 View  Result Date: 09/08/2018 CLINICAL DATA:  Hypoxia.  Question aspiration. EXAM: PORTABLE CHEST 1 VIEW COMPARISON:  Radiographs 2 days ago 09/15/2018 FINDINGS: Low lung volumes persist. Normal heart size and mediastinal contours for technique. No focal airspace disease, pleural effusion or pneumothorax. Unchanged osseous structures. IMPRESSION: Persistent low lung volumes without acute abnormality. Electronically Signed   By: Keith Rake M.D.   On: 09/08/2018 05:04    Micro Results     Recent Results (from the past 240 hour(s))  Urine culture     Status: None   Collection Time: 09/07/2018  5:51 PM  Result Value Ref Range Status   Specimen Description URINE, RANDOM  Final   Special Requests NONE  Final   Culture   Final    NO GROWTH Performed at [facility], [address] [place], [place] [address]    Report Status 09/06/2018 FINAL  Final  MRSA PCR Screening     Status: None   Collection Time: 2018-09-22  5:52 AM  Result Value Ref Range Status   MRSA by PCR NEGATIVE NEGATIVE Final    Comment:        The GeneXpert MRSA Assay (FDA approved for NASAL specimens only), is one component of a comprehensive MRSA colonization surveillance program. It is not intended to diagnose MRSA infection nor to guide or monitor treatment for MRSA infections. Performed at [facility], [place] [address] [place], [place] [address]     Today   Subjective    Joseph Mora today is unable to talk or answer questions   Objective   Blood pressure 93/69, pulse (!) 114, temperature (!) 97.5 F (36.4 C), temperature source Oral, resp. rate 18, height 6' (1.829 m), weight 54.4 kg, SpO2 95 %.   Intake/Output Summary (Last 24 hours) at Sep 12, 2018 0947 Last data filed at 09/08/2018 1500 Gross per 24 hour  Intake 339.88 ml  Output -  Net 339.88 ml    Exam  He is awake today but completely confused, moving all 4 extremities by himself,un able to follow commands [place].AT, Supple Neck,No JVD, No cervical lymphadenopathy appriciated.    Symmetrical Chest wall movement, Good air movement bilaterally, CTAB RRR,No Gallops, Rubs or new Murmurs, No Parasternal Heave +ve B.Sounds, Abd Soft, No tenderness, No organomegaly appriciated, No rebound - guarding or rigidity. No Cyanosis, Clubbing or edema, No new Rash or bruise    Data Review   CBC w Diff:  Lab Results  Component Value Date   WBC 12.7 (H) 09/08/2018   HGB 16.2 09/08/2018   HCT 47.9 09/08/2018   PLT 98 (L) 09/08/2018   LYMPHOPCT 5 09/04/2018   MONOPCT 10 08/24/2018   EOSPCT 0 08/23/2018   BASOPCT 0 09/07/2018    CMP:  Lab Results  Component Value Date   NA 140 09/08/2018   K 3.8 09/08/2018   CL 104 09/08/2018   CO2 23 09/08/2018   BUN 19 09/08/2018   CREATININE 1.31 (H) 09/08/2018   CREATININE 1.15 (H) 07/30/2017   PROT 6.3 (L) 09/06/2018   ALBUMIN 2.8 (L) 09/15/2018   BILITOT 0.9 09/06/2018   ALKPHOS 91 09/04/2018   AST 35 09/16/2018   ALT 35 08/23/2018  .   Total Time in preparing paper work, data evaluation and todays exam - 85 minutes  Lala Lund M.D on 09-12-18 at [place]  [phone]

## 2018-09-22 NOTE — Progress Notes (Signed)
Pt BP in both arms 50s/30s. HJR low 100s. Pts family called 20 plus family member at the [facility]. Pts BP rebounded to 123/70 at 1630. Will continue to assess

## 2018-09-22 NOTE — Discharge Instructions (Signed)
Disposition.  Residential hospice. Condition.  Guarded CODE STATUS.  DNR Diet.  For comfort only, soft diet with feeding assistance and aspiration precautions.  Very high aspiration risk. Activity.  With assistance and fall precautions as tolerated.

## 2018-09-22 NOTE — Progress Notes (Signed)
Daily Progress Note   Patient Name: Joseph Mora       Date: Sep 28, 2018 DOB: 25-May-1933  Age: 85 y.o. MRN#: [id number] Attending Physician: Thurnell Lose, MD Primary Care Physician: Elby Showers, MD Admit Date: 09/03/2018  Reason for Consultation/Follow-up: Establishing goals of care, Hospice Evaluation and Terminal Care  Subjective: Patient does not respond to me. No family present. Increased work of breathing. Appears slightly agitated.   Length of Stay: 3  Current Medications: Scheduled Meds:  . fentaNYL (SUBLIMAZE) injection  25 mcg Intravenous Once  . mouth rinse  15 mL Mouth Rinse BID    Continuous Infusions:   PRN Meds: antiseptic oral rinse, fentaNYL (SUBLIMAZE) injection, glycopyrrolate, haloperidol lactate, polyvinyl alcohol  Physical Exam         Constitutional:      Appearance: He is ill-appearing.  HENT:     Head: Normocephalic and atraumatic.  Cardiovascular:     Rate and Rhythm: Normal rate and regular rhythm.  Pulmonary:     Effort: Tachypnea    Breath sounds: Normal breath sounds.  Abdominal:     General: Bowel sounds are normal.     Palpations: Abdomen is soft.  Musculoskeletal:     Right lower leg: No edema.     Left lower leg: No edema.  Skin:    General: Skin is warm and dry.  Neurological:     Mental Status: He is disoriented.  Psychiatric:        Behavior: Behavior is uncooperative and agitated.    Cognition and Memory: Cognition is impaired. Memory is impaired.   Vital Signs: BP 93/69 (BP Location: Right Arm)   Pulse (!) 114   Temp (!) 97.5 F (36.4 C) (Oral)   Resp 18   Ht 6' (1.829 m)   Wt 54.4 kg   SpO2 95%   BMI 16.27 kg/m  SpO2: SpO2: 95 % O2 Device: O2 Device: Nasal Cannula O2 Flow Rate: O2 Flow Rate (L/min): 5  L/min  Intake/output summary:   Intake/Output Summary (Last 24 hours) at 2018/09/28 1038 Last data filed at 09/08/2018 1500 Gross per 24 hour  Intake 339.88 ml  Output -  Net 339.88 ml   LBM: Last BM Date: 09/08/18 Baseline Weight: Weight: 54.4 kg Most recent weight: Weight: 54.4 kg       Palliative Assessment/Data: PPS 10%    Flowsheet Rows     Most Recent Value  Intake Tab  Referral Department  Hospitalist  Unit at Time of Referral  Med/Surg Unit  Palliative Care Primary Diagnosis  Neurology  Date Notified  09/07/18  Palliative Care Type  New Palliative care  Reason for referral  Clarify Goals of Care, Counsel Regarding Hospice  Date of Admission  09/06/18  Date first seen by Palliative Care  09/07/18  # of days Palliative referral response time  0 Day(s)  # of days IP prior to Palliative referral  1  Clinical Assessment  Palliative Performance Scale Score  20%  Psychosocial & Spiritual Assessment  Palliative Care Outcomes  Patient/Family meeting held?  Yes  Who was at the meeting?  wife  Palliative Care Outcomes  Transitioned to hospice      Patient  Active Problem List   Diagnosis Date Noted  . Comfort measures only status   . Palliative care by specialist   . Goals of care, counseling/discussion   . Acute metabolic encephalopathy 86/75/4492  . Hypernatremia 09/07/2018  . Dehydration 02/24/2018  . Hypoglycemia 10/14/2015  . CAD in native artery   . Memory difficulty 06/22/2015  . Tremor 06/22/2015  . Premature ventricular contractions 06/22/2015  . Essential hypertension 05/23/2012  . Benign prostatic hyperplasia 04/21/2011  . Chronic back pain 04/21/2011  . Insulin dependent diabetes mellitus ([address]) 04/21/2011  . Hx of adenomatous colonic polyps 04/21/2011  . History of Helicobacter infection 04/21/2011  . CAD (coronary artery disease) 04/03/2011  . Hyperlipidemia 04/03/2011    Palliative Care Assessment & Plan   HPI: 85 y.o. male  with past  medical history of dementia, multiple falls, IDDM, and BPH admitted on 09/18/2018 with hypoglycemia and increased confusion. Patient resides at a memory care unit and requires total care. Patient found to be severely dehydrated. PMT consulted for [place].  Assessment: Family meeting with patient's family yesterday - decisions made for comfort care and residential hospice placement.   Follow up with patient today - no family at bedside. Patient appears slightly agitated and tachypneic - no PRNs given per [facility] review. Will order dose of fentanyl now and add PNR q1hr (pt allergic to morphine)  Recommendations/Plan:  Comfort care - discontinued IV fluids and medications not necessary for comfort  Residential hospice placement - CSW consulted - family requests [facility] - waiting on bed  Continue PRN haldol  Will add PRN robinul for excessive secretions  PRN fentanyl if for dyspnea  Goals of Care and Additional Recommendations:  Limitations on Scope of Treatment: Full Comfort Care  Code Status:  DNR  Prognosis:   < 2 weeks  Discharge Planning:  Hospice facility  Care plan was discussed with RN  Thank you for allowing the Palliative Medicine Team to assist in the care of this patient.   Total Time 25 minutes Prolonged Time Billed  no       Greater than 50%  of this time was spent counseling and coordinating care related to the above assessment and plan.  Juel Burrow, DNP, [facility] Palliative Medicine Team Team Phone # [phone]  Pager [phone]

## 2018-09-22 NOTE — Progress Notes (Addendum)
[facility])  Patient is eligible for residential hospice services at [facility], however we do not have a bed to offer.  Spoke with wife [name] and updated.  Advised her should a bed become available, we would need to meet to complete necessary paperwork prior to transfer.    Updated LCSW.    Will update family and LCSW as bed availability changes.  **addendum 11:07- [facility] will have a bed to offer on 12/20.  Spoke with wife, they cannot come today to complete paperwork due to her own medical appointments, but can come to hospital tomorrow morning at 930 to complete paperwork**  Venia Carbon BSN, [facility] (listed in [place]) [phone]

## 2018-09-22 NOTE — Consult Note (Signed)
   [facility] CM Inpatient Consult   2018-09-24  RIYAD KEENA Dec 22, 1932 [id number]   Patient screened for potential [facility] services in the [facility] Advantage plan. Patient is for residential Hospice care per notes for [facility]. No community follow up needed with [facility] management.   For questions contact:   Natividad Brood, RN BSN [facility] Liaison  [phone] business mobile phone Toll free office [phone]

## 2018-09-22 DEATH — deceased

## 2018-11-25 ENCOUNTER — Ambulatory Visit: Payer: PPO | Admitting: Neurology

## 2018-12-13 ENCOUNTER — Encounter (INDEPENDENT_AMBULATORY_CARE_PROVIDER_SITE_OTHER): Payer: PPO | Admitting: Ophthalmology
